# Patient Record
Sex: Male | Born: 1950
Health system: Southern US, Community
[De-identification: ages and names within clinical notes are randomized; demographics above are authoritative.]

## PROBLEM LIST (undated history)

## (undated) DIAGNOSIS — K219 Gastro-esophageal reflux disease without esophagitis: Secondary | ICD-10-CM

## (undated) DIAGNOSIS — S82899A Other fracture of unspecified lower leg, initial encounter for closed fracture: Secondary | ICD-10-CM

## (undated) DIAGNOSIS — R7303 Prediabetes: Secondary | ICD-10-CM

## (undated) DIAGNOSIS — G4730 Sleep apnea, unspecified: Secondary | ICD-10-CM

## (undated) DIAGNOSIS — N400 Enlarged prostate without lower urinary tract symptoms: Secondary | ICD-10-CM

## (undated) DIAGNOSIS — R519 Headache, unspecified: Secondary | ICD-10-CM

## (undated) DIAGNOSIS — R29898 Other symptoms and signs involving the musculoskeletal system: Secondary | ICD-10-CM

## (undated) DIAGNOSIS — H3320 Serous retinal detachment, unspecified eye: Secondary | ICD-10-CM

## (undated) DIAGNOSIS — E785 Hyperlipidemia, unspecified: Secondary | ICD-10-CM

## (undated) DIAGNOSIS — M1990 Unspecified osteoarthritis, unspecified site: Secondary | ICD-10-CM

## (undated) DIAGNOSIS — S42009A Fracture of unspecified part of unspecified clavicle, initial encounter for closed fracture: Secondary | ICD-10-CM

## (undated) DIAGNOSIS — I10 Essential (primary) hypertension: Secondary | ICD-10-CM

## (undated) DIAGNOSIS — J302 Other seasonal allergic rhinitis: Secondary | ICD-10-CM

## (undated) DIAGNOSIS — G4733 Obstructive sleep apnea (adult) (pediatric): Secondary | ICD-10-CM

## (undated) DIAGNOSIS — E039 Hypothyroidism, unspecified: Secondary | ICD-10-CM

## (undated) DIAGNOSIS — L719 Rosacea, unspecified: Secondary | ICD-10-CM

## (undated) HISTORY — PX: EYE SURGERY: SHX253

## (undated) HISTORY — DX: Obstructive sleep apnea (adult) (pediatric): G47.33

## (undated) HISTORY — PX: TONSILLECTOMY: SUR1361

## (undated) HISTORY — PX: CATARACT EXTRACTION: SUR2

## (undated) HISTORY — PX: OTHER SURGICAL HISTORY: SHX169

## (undated) HISTORY — DX: Hyperlipidemia, unspecified: E78.5

## (undated) HISTORY — DX: Essential (primary) hypertension: I10

## (undated) HISTORY — DX: Other seasonal allergic rhinitis: J30.2

## (undated) HISTORY — DX: Other fracture of unspecified lower leg, initial encounter for closed fracture: S82.899A

## (undated) HISTORY — PX: HERNIA REPAIR: SHX51

## (undated) HISTORY — DX: Gastro-esophageal reflux disease without esophagitis: K21.9

## (undated) HISTORY — DX: Benign prostatic hyperplasia without lower urinary tract symptoms: N40.0

## (undated) HISTORY — DX: Fracture of unspecified part of unspecified clavicle, initial encounter for closed fracture: S42.009A

## (undated) HISTORY — DX: Serous retinal detachment, unspecified eye: H33.20

## (undated) HISTORY — DX: Hypothyroidism, unspecified: E03.9

## (undated) HISTORY — DX: Rosacea, unspecified: L71.9

## (undated) HISTORY — DX: Sleep apnea, unspecified: G47.30

## (undated) HISTORY — DX: Other symptoms and signs involving the musculoskeletal system: R29.898

---

## 1998-07-16 ENCOUNTER — Ambulatory Visit (HOSPITAL_COMMUNITY): Admission: RE | Admit: 1998-07-16 | Discharge: 1998-07-16 | Payer: Self-pay

## 2003-03-14 ENCOUNTER — Ambulatory Visit (HOSPITAL_COMMUNITY): Admission: RE | Admit: 2003-03-14 | Discharge: 2003-03-14 | Payer: Self-pay | Admitting: Gastroenterology

## 2003-03-14 ENCOUNTER — Encounter (INDEPENDENT_AMBULATORY_CARE_PROVIDER_SITE_OTHER): Payer: Self-pay | Admitting: Specialist

## 2004-06-22 ENCOUNTER — Encounter: Admission: RE | Admit: 2004-06-22 | Discharge: 2004-06-22 | Payer: Self-pay | Admitting: Internal Medicine

## 2004-07-06 ENCOUNTER — Encounter: Admission: RE | Admit: 2004-07-06 | Discharge: 2004-07-06 | Payer: Self-pay | Admitting: Internal Medicine

## 2004-10-11 ENCOUNTER — Encounter: Admission: RE | Admit: 2004-10-11 | Discharge: 2004-10-11 | Payer: Self-pay | Admitting: Orthopedic Surgery

## 2005-01-20 ENCOUNTER — Observation Stay (HOSPITAL_COMMUNITY): Admission: EM | Admit: 2005-01-20 | Discharge: 2005-01-21 | Payer: Self-pay | Admitting: Emergency Medicine

## 2009-11-07 DIAGNOSIS — H3322 Serous retinal detachment, left eye: Secondary | ICD-10-CM

## 2009-11-07 HISTORY — PX: OTHER SURGICAL HISTORY: SHX169

## 2009-11-07 HISTORY — DX: Serous retinal detachment, left eye: H33.22

## 2010-08-04 ENCOUNTER — Encounter: Admission: RE | Admit: 2010-08-04 | Discharge: 2010-08-04 | Payer: Self-pay | Admitting: Internal Medicine

## 2010-08-10 ENCOUNTER — Ambulatory Visit (HOSPITAL_COMMUNITY): Admission: AD | Admit: 2010-08-10 | Discharge: 2010-08-11 | Payer: Self-pay | Admitting: Ophthalmology

## 2011-01-19 LAB — BASIC METABOLIC PANEL
BUN: 12 mg/dL (ref 6–23)
CO2: 27 mEq/L (ref 19–32)
Calcium: 9.5 mg/dL (ref 8.4–10.5)
Chloride: 104 mEq/L (ref 96–112)
Creatinine, Ser: 1.09 mg/dL (ref 0.4–1.5)
GFR calc Af Amer: >60 mL/min (ref >60)
GFR calc non Af Amer: >60 mL/min (ref >60)
Glucose, Bld: 112 mg/dL — ABNORMAL HIGH (ref 70–99)
Potassium: 4.6 mEq/L (ref 3.5–5.1)
Sodium: 138 mEq/L (ref 135–145)

## 2011-01-19 LAB — CBC
HCT: 41.2 % (ref 39.0–52.0)
Hemoglobin: 14.2 g/dL (ref 13.0–17.0)
MCH: 33.3 pg (ref 26.0–34.0)
MCHC: 34.5 g/dL (ref 30.0–36.0)
MCV: 96.5 fL (ref 78.0–100.0)
Platelets: 231 10*3/uL (ref 150–400)
RBC: 4.27 MIL/uL (ref 4.22–5.81)
RDW: 12.7 % (ref 11.5–15.5)
WBC: 4.5 10*3/uL (ref 4.0–10.5)

## 2011-01-19 LAB — SURGICAL PCR SCREEN
MRSA, PCR: NEGATIVE
Staphylococcus aureus: POSITIVE — AB

## 2011-03-25 NOTE — Op Note (Signed)
   Rick Turner, Rick Turner                       ACCOUNT NO.:  [id number]   MEDICAL RECORD NO.:  [id number]                   PATIENT TYPE:  AMB   LOCATION:  ENDO                                 FACILITY:  [facility]   PHYSICIAN:  Danise Edge, M.D.                DATE OF BIRTH:  25-Jun-1951   DATE OF PROCEDURE:  03/14/2003  DATE OF DISCHARGE:  03/14/2003                                 OPERATIVE REPORT   PROCEDURE PERFORMED:  Colonoscopy and polypectomy.   ENDOSCOPIST:  Charolett Bumpers, M.D.   INDICATIONS FOR PROCEDURE:  The patient is a 60 year old male born Apr 04, 1951.  The patient is scheduled to undergo his first screening colonoscopy  with polypectomy to prevent colon cancer.   PREMEDICATION:  Versed 10 mg, Demerol 50 mg.   DESCRIPTION OF PROCEDURE:  After obtaining informed consent, the patient was  placed in the left lateral decubitus position.  I administered intravenous  Demerol and intravenous Versed to achieve conscious sedation for the  procedure.  The patient's blood pressure, oxygen saturations and cardiac  rhythm were monitored throughout the procedure and documented in the medical  record.   Anal inspection was normal.  Digital rectal exam revealed a non-nodular  prostate.  The Olympus adult video colonoscope was introduced into the  rectum and advanced to the cecum.  Colonic preparation for the exam today  was excellent.   Rectum:  Normal.   Sigmoid colon and descending colon:  At 60 cm from the anal verge, a 1 mm  sessile polyp was removed with the cold biopsy forceps.   Splenic flexure:  Normal.   Transverse colon:  Normal.   Hepatic flexure:  Normal.   Ascending colon:  Normal.   Cecum and ileocecal valve:  Normal.    ASSESSMENT:  In the distal descending colon at 60 cm from the anal verge, a  1 mm sessile polyp was removed; otherwise normal proctocolonoscopy to the  cecum.                                                   Danise Edge, M.D.    MJ/MEDQ  D:  03/14/2003  T:  03/17/2003  Job:  [id number]   cc:   Thora Lance, M.D.  [address]  [place]  [place] [address]  Fax: [phone]

## 2011-03-25 NOTE — H&P (Signed)
Rick Turner, Rick Turner           ACCOUNT NO.:  [id number]   MEDICAL RECORD NO.:  [id number]          PATIENT TYPE:  INP   LOCATION:  1844                         FACILITY:  [facility]   PHYSICIAN:  Thora Lance, M.D.  DATE OF BIRTH:  [birth year]   DATE OF ADMISSION:  01/20/2005  DATE OF DISCHARGE:                                HISTORY & PHYSICAL   CHIEF COMPLAINT:  Chest tightness.   HISTORY OF PRESENT ILLNESS:  This is 60 year old white male with a history  of hypertension, hyperlipidemia, both medially treated, who woke up this  a.m. with tightness across his upper chest, sometimes radiating up towards  the neck, but not the arms.  The tightness seemed worse with activity, such  as getting ready for work, taking a shower.  If he took a deep breath or  raised his arm, it felt worse.  There was some mild shortness of breath and  difficulty getting a full breath associated.  Over the last couple weeks, he  has noticed times when he has felt mildly short of breath while at rest.  In  the ER, he was given nitroglycerin paste, which seemed to help resolve the  chest tightness.   PAST MEDICAL HISTORY:  1.  Hypertension.  2.  Hyperlipidemia.  3.  Seasonal allergic rhinitis.  4.  BPH.  5.  Hypothyroidism.   SURGICAL HISTORY:  1.  Tonsillectomy.  2.  LASIK eye surgery.   ALLERGIES:  No known drug allergies.   CURRENT MEDICATIONS:  1.  Synthroid 88 mcg daily.  2.  Allegra 180 mg daily.  3.  Lisinopril 20 mg daily.  4.  Zocor 20 mg q.p.m.  5.  Nasonex two sprays each nostril daily.  6.  Aspirin 81 mg daily.  7.  Multivitamin once a day.   FAMILY HISTORY:  Father alive at age 54.  History of CAD at age 20, CVD,  diabetes, stroke, pancreatic insufficiency, BPH, PUD.  Mother at 29 had CAD  in her 68s, arthritis.  Brother with peptic ulcer disease.  Two brothers  with hypertension.  Paternal uncle with an MI in his 4s.   SOCIAL HISTORY:  Married.  Two children.  Smoking - no.   Alcohol - moderate.   PHYSICAL EXAMINATION:  GENERAL:  Well-appearing white male.  Blood pressure  142/88, heart rate 73, respirations 18, oxygen saturation 100% on room air.  HEENT:  Pupils equal, round and respond to light.  Extraocular movements  intact.  Ears - TMs are clear.  Oropharynx clear.  NECK:  Supple.  No lymphadenopathy.  No carotid bruits.  LUNGS:  Clear.  HEART:  Regular rate and rhythm.  No murmur, rub, or gallop.  ABDOMEN:  Soft, nontender.  No masses or hepatosplenomegaly.  EXTREMITIES:  No edema.  NEUROLOGIC:  Non-focal.   LABORATORY DATA:  Sodium 136, potassium 4.2, chloride 105, bicarbonate 24,  creatinine 1.3, glucose 109, MB fraction 1.1, troponin I of __________, D-  dimer within normal limits.  Chest x-ray showed mild bronchitic changes.  No  acute disease.  EKG revealed normal sinus rhythm, normal EKG.   ASSESSMENT:  Atypical chest pain, rule out cardiac source.  He has multiple  cardiac risk factors including hyperlipidemia, hypertension, and family  history.   PLAN:  1.  Observation.  2.  Rule out myocardial infarction by enzymes.  3.  Lovenox.  4.  Aspirin.  5.  Nitroglycerin p.r.n.  6.  Cardiology consult.      JJG/MEDQ  D:  01/20/2005  T:  01/20/2005  Job:  [id number]

## 2011-03-25 NOTE — Discharge Summary (Signed)
[name], Rick Turner           ACCOUNT NO.:  [id number]   MEDICAL RECORD NO.:  [id number]          PATIENT TYPE:  INP   LOCATION:  3707                         FACILITY:  [facility]   PHYSICIAN:  Thora Lance, M.D.  DATE OF BIRTH:  1951/04/15   DATE OF ADMISSION:  01/20/2005  DATE OF DISCHARGE:  01/21/2005                                 DISCHARGE SUMMARY   REASON FOR ADMISSION:  This is a 60 year old white male with history of  hypertension and hyperlipidemia, who presented this morning to the ER  complaining of chest tightness across chest and into his neck; worse with  activity.  Also worse with __________.  Mild dyspnea associated with it.   SIGNIFICANT FINDINGS:  Blood pressure 142/88, heart rate 73, respirations  18, afebrile; 100% on room air.  The rest of the examination was  unremarkable.   LABORATORY STUDIES:  Sodium 136, potassium 4.2, chloride 105, BUN 24,  creatinine 1.3, glucose 109.  MB fraction 1.1, Troponin-I less than 0.05.  d-  Dimer normal.   CHEST X-RAY:  Negative, no acute disease.   EKG:  Normal sinus rhythm; normal EKG.   HOSPITAL COURSE:  ATYPICAL CHEST PAIN.  The patient was admitted for  atypical chest pain.  He was placed on aspirin and Lovenox.  Cardiology (Dr.  Corliss Marcus) saw the patient and recommended outpatient treadmill test, if  the patient ruled out for MI and a cardiac catheterization if he ruled in.  The patient's serial cardiac enzymes remained within normal limits.  The  patient had no further pain during his hospitalization.  He was discharged  the next day in good condition.   He will see me in one week.  Outpatient stress test will be arranged.   DISCHARGE DIAGNOSES:  Chest pain/rule out myocardial infarction.   PROCEDURES:  None.   DISCHARGE MEDICATIONS:  1.  Synthroid 88 mcg q.d.  2.  Lisinopril 20 mg q.d.  3.  Zocor 20 mg q.d.  4.  Nasonex.  5.  Allegra 180 mg q.d.  6.  Aspirin 325 mg q.d.  7.  Nitroglycerin p.r.n.   DIET:  Low sodium.   DISPOSITION:  To home.   DISCHARGE ACTIVITIES:  As tolerated.   FOLLOWUP:  One week with Dr. Valentina Lucks.      JJG/MEDQ  D:  01/21/2005  T:  01/21/2005  Job:  [id number]   cc:   Francisca December, M.D.  [address]  [place]  [place] [address]  Fax: [phone]

## 2011-03-25 NOTE — Consult Note (Signed)
Rick Turner, Rick Turner           ACCOUNT NO.:  [id number]   MEDICAL RECORD NO.:  [id number]          PATIENT TYPE:  INP   LOCATION:  1844                         FACILITY:  [facility]   PHYSICIAN:  Francisca December, M.D.  DATE OF BIRTH:  [birth year]   DATE OF CONSULTATION:  01/20/2005  DATE OF DISCHARGE:                                   CONSULTATION   REASON FOR CONSULTATION:  Chest pain.   HISTORY OF PRESENT ILLNESS:  Mr. Rory Xiang is a pleasant 60 year old  man who awoke this morning and noticed as he was giving out of bed an  anterior substernal tightness to pressure-like discomfort that stayed in the  center of his chest and did not seem to resolved.  In fact, later when he  was in the shower, as he washed his hair and moved his arms or tried to take  a deep breath it seemed to worsen.  At one point, it radiated slightly up  into the neck.  It was not associated with nausea, diaphoresis.  As  mentioned, there was some mild dyspnea.  The discomfort persisted for  greater than one hour, and he decided to present to [facility] Emergency  Room.  Here in the emergency room, where he arrived at about 0740 he was  given oxygen by nasal cannula and a topical nitroglycerin paste with  subsequent resolution of the discomfort.  At the time of my evaluation at 3  o'clock in the afternoon he denies any discomfort whatsoever.  However, they  did have to remove the nitroglycerin as it was producing significant  cephalgia.   He has no prior cardiac history.  He exercises on an elliptical machine  three times a week for 30 minutes and has not had any symptoms associated  with that activity.  He has not had increasing fatigue or loss of energy.   PAST MEDICAL HISTORY:  1.  Hypertension.  2.  Hyperlipidemia.  3.  Benign prostatic hypertrophy.  4.  Hypothyroidism.  5.  Seasonal allergies.   PAST SURGICAL HISTORY:  1.  Remote history of tonsillectomy and adenoidectomy.  2.  He also  has had laser eye surgery.   CURRENT MEDICATIONS:  1.  Synthroid 0.08 mg p.o. every day.  2.  Lisinopril 20 mg p.o. every day.  3.  Zocor 20 mg p.o. every day.  4.  Nasonex allergy spray each day.  5.  Allegra 180 mg p.o. every day.  6.  Aspirin 81 mg p.o. every day.   DRUG ALLERGIES:  None known.   FAMILY HISTORY:  Not significant for early coronary disease.  His father did  have a stroke at the age of 60.  His mother developed angina late in age.   REVIEW OF SYSTEMS:  Negative except as mentioned above.   PHYSICAL EXAMINATION:  VITAL SIGNS:  Blood pressure 142/88, pulse 73 and  regular.  Temperature 98.0, respirations 18, O2 saturation on room air 100%.  GENERAL:  This is a well-appearing 60 year old gentleman in no acute  distress.  HEENT:  Unremarkable.  Head is atraumatic and normocephalic.  The pupils are  equal, round and reactive to light and accommodation.  Extraocular movements  are intact.  Oral mucosa is pink and moist.  Teeth and gums in good repair.  NECK:  Supple without thyromegaly or masses.  The carotid upstrokes are  normal.  There is no bruit.  There is no jugular venous distention.  CHEST:  His chest is clear with good excursion.  HEART:  Regular rhythm.  Normal S1 and S2 are heard.  No S3, S4, click,  murmur or rub noted.  ABDOMEN:  Flat, soft and nontender without hepatosplenomegaly or midline  pulsatile mass.  Bowel sounds are present in all quadrants.  GENITALIA:  External genitalia - normal male phallus, descended testicles,  no lesions.  RECTAL:  Not performed.  EXTREMITIES:  Full range of motion.  No edema.  Intact distal pulses.  NEUROLOGICAL:  Cranial nerves II-XII are intact.  Motor and sensory are  grossly intact.  Gait not tested.  SKIN:  Warm, dry and clear.   LABORATORY DATA:  Initial point of care enzymes in the emergency room are  negative.  Serum electrolytes, BUN, creatinine, glucose all within normal  limits.  Electrocardiogram shows  sinus rhythm, normal EKG.  Chest x-ray:  No  active cardiopulmonary disease.   IMPRESSION:  1.  Prolonged episode of atypical angina, currently without evidence of      coronary ischemia by ECG or cardiac markers.  2.  Risk factors for coronary disease include age, sex, hyperlipidemia and      hypertension.  3.  Other problems as listed above.   PLAN:  1.  I agree with the management thus far which includes admission for      telemetry monitoring and repeat CK-MB and troponin enzymes.  Agree with      the anticoagulation plan with Lovenox and aspirin.  Would give      nitroglycerin on a p.r.n. basis only secondary to his induced headache.  2.  If the patient rules out for myocardial infarction would obtain exercise      stress test, not necessarily with myocardial perfusion imaging.  This      can be done either here in the hospital or as an outpatient.  If      symptoms recur or cardiac enzymes become positive then cardiac      catheterization would be indicated.   Thank you very much for allowing me to assist in the care of Mr. Ahmon Tosi.  It has been a pleasure to do so.  I will discuss his further  care with you.      JHE/MEDQ  D:  01/20/2005  T:  01/20/2005  Job:  [id number]   cc:   Thora Lance, M.D.  [address]  [place]  [place] [address]  Fax: [phone]

## 2011-06-29 ENCOUNTER — Encounter (INDEPENDENT_AMBULATORY_CARE_PROVIDER_SITE_OTHER): Payer: Managed Care, Other (non HMO) | Admitting: Ophthalmology

## 2011-06-29 DIAGNOSIS — H2510 Age-related nuclear cataract, unspecified eye: Secondary | ICD-10-CM

## 2011-06-29 DIAGNOSIS — H33009 Unspecified retinal detachment with retinal break, unspecified eye: Secondary | ICD-10-CM

## 2011-06-29 DIAGNOSIS — H43819 Vitreous degeneration, unspecified eye: Secondary | ICD-10-CM

## 2011-08-08 HISTORY — PX: CATARACT EXTRACTION: SUR2

## 2012-06-28 ENCOUNTER — Encounter (INDEPENDENT_AMBULATORY_CARE_PROVIDER_SITE_OTHER): Payer: Managed Care, Other (non HMO) | Admitting: Ophthalmology

## 2012-06-28 DIAGNOSIS — H33009 Unspecified retinal detachment with retinal break, unspecified eye: Secondary | ICD-10-CM

## 2012-06-28 DIAGNOSIS — H43819 Vitreous degeneration, unspecified eye: Secondary | ICD-10-CM

## 2012-06-28 DIAGNOSIS — H2510 Age-related nuclear cataract, unspecified eye: Secondary | ICD-10-CM

## 2013-06-28 ENCOUNTER — Ambulatory Visit (INDEPENDENT_AMBULATORY_CARE_PROVIDER_SITE_OTHER): Payer: Managed Care, Other (non HMO) | Admitting: Ophthalmology

## 2013-07-17 ENCOUNTER — Ambulatory Visit (INDEPENDENT_AMBULATORY_CARE_PROVIDER_SITE_OTHER): Payer: Commercial Indemnity | Admitting: Ophthalmology

## 2013-07-17 DIAGNOSIS — H33009 Unspecified retinal detachment with retinal break, unspecified eye: Secondary | ICD-10-CM

## 2013-07-17 DIAGNOSIS — H35039 Hypertensive retinopathy, unspecified eye: Secondary | ICD-10-CM

## 2013-07-17 DIAGNOSIS — I10 Essential (primary) hypertension: Secondary | ICD-10-CM

## 2013-07-17 DIAGNOSIS — H2510 Age-related nuclear cataract, unspecified eye: Secondary | ICD-10-CM

## 2013-07-17 DIAGNOSIS — H43819 Vitreous degeneration, unspecified eye: Secondary | ICD-10-CM

## 2013-08-20 ENCOUNTER — Encounter (INDEPENDENT_AMBULATORY_CARE_PROVIDER_SITE_OTHER): Payer: 59 | Admitting: Ophthalmology

## 2013-08-20 DIAGNOSIS — H2510 Age-related nuclear cataract, unspecified eye: Secondary | ICD-10-CM

## 2013-08-20 DIAGNOSIS — H43819 Vitreous degeneration, unspecified eye: Secondary | ICD-10-CM

## 2013-08-20 DIAGNOSIS — H33009 Unspecified retinal detachment with retinal break, unspecified eye: Secondary | ICD-10-CM

## 2013-08-20 DIAGNOSIS — I10 Essential (primary) hypertension: Secondary | ICD-10-CM

## 2013-08-20 DIAGNOSIS — Z961 Presence of intraocular lens: Secondary | ICD-10-CM | POA: Insufficient documentation

## 2013-08-20 DIAGNOSIS — H35039 Hypertensive retinopathy, unspecified eye: Secondary | ICD-10-CM

## 2013-08-20 DIAGNOSIS — H3320 Serous retinal detachment, unspecified eye: Secondary | ICD-10-CM | POA: Insufficient documentation

## 2013-08-20 DIAGNOSIS — H04129 Dry eye syndrome of unspecified lacrimal gland: Secondary | ICD-10-CM | POA: Insufficient documentation

## 2013-08-30 ENCOUNTER — Encounter (INDEPENDENT_AMBULATORY_CARE_PROVIDER_SITE_OTHER): Payer: 59 | Admitting: Ophthalmology

## 2013-08-30 DIAGNOSIS — H33009 Unspecified retinal detachment with retinal break, unspecified eye: Secondary | ICD-10-CM

## 2013-12-02 ENCOUNTER — Encounter (INDEPENDENT_AMBULATORY_CARE_PROVIDER_SITE_OTHER): Payer: 59 | Admitting: Ophthalmology

## 2013-12-02 DIAGNOSIS — H2510 Age-related nuclear cataract, unspecified eye: Secondary | ICD-10-CM

## 2013-12-02 DIAGNOSIS — H33009 Unspecified retinal detachment with retinal break, unspecified eye: Secondary | ICD-10-CM

## 2013-12-02 DIAGNOSIS — H43819 Vitreous degeneration, unspecified eye: Secondary | ICD-10-CM

## 2013-12-02 DIAGNOSIS — I10 Essential (primary) hypertension: Secondary | ICD-10-CM

## 2013-12-02 DIAGNOSIS — H35039 Hypertensive retinopathy, unspecified eye: Secondary | ICD-10-CM

## 2014-07-18 ENCOUNTER — Ambulatory Visit (INDEPENDENT_AMBULATORY_CARE_PROVIDER_SITE_OTHER): Payer: Commercial Indemnity | Admitting: Ophthalmology

## 2014-08-04 ENCOUNTER — Encounter: Payer: Self-pay | Admitting: Cardiology

## 2014-08-11 ENCOUNTER — Ambulatory Visit
Admission: RE | Admit: 2014-08-11 | Discharge: 2014-08-11 | Disposition: A | Discharge status: Home / Self Care | Payer: Managed Care, Other (non HMO) | Source: Ambulatory Visit | Attending: Internal Medicine | Admitting: Internal Medicine

## 2014-08-11 ENCOUNTER — Other Ambulatory Visit: Payer: Self-pay | Admitting: Internal Medicine

## 2014-08-11 DIAGNOSIS — M25531 Pain in right wrist: Secondary | ICD-10-CM

## 2014-08-25 ENCOUNTER — Encounter (HOSPITAL_COMMUNITY): Payer: Managed Care, Other (non HMO)

## 2014-09-01 ENCOUNTER — Ambulatory Visit (HOSPITAL_COMMUNITY): Payer: Managed Care, Other (non HMO) | Attending: Internal Medicine | Admitting: Radiology

## 2014-09-01 VITALS — BP 159/90 | Ht 70.0 in | Wt 233.0 lb

## 2014-09-01 DIAGNOSIS — R0602 Shortness of breath: Secondary | ICD-10-CM | POA: Diagnosis not present

## 2014-09-01 DIAGNOSIS — I10 Essential (primary) hypertension: Secondary | ICD-10-CM | POA: Diagnosis not present

## 2014-09-01 DIAGNOSIS — R0609 Other forms of dyspnea: Secondary | ICD-10-CM

## 2014-09-01 MED ORDER — TECHNETIUM TC 99M SESTAMIBI GENERIC - CARDIOLITE
10.0000 | Freq: Once | INTRAVENOUS | Status: AC | PRN
  Administered 2014-09-01: 10 via INTRAVENOUS

## 2014-09-01 MED ORDER — TECHNETIUM TC 99M SESTAMIBI GENERIC - CARDIOLITE
30.0000 | Freq: Once | INTRAVENOUS | Status: AC | PRN
  Administered 2014-09-01: 30 via INTRAVENOUS

## 2014-09-01 NOTE — Progress Notes (Signed)
[facility] [address] [place], [place] [address] [phone]    Cardiology Nuclear Med Study  Darvis Croft is a 63 y.o. male     MRN : [id number]     DOB: 13-Sep-1951  Procedure Date: 09/01/2014  Nuclear Med Background Indication for Stress Test:  Evaluation for Ischemia History:  No Cardiac History Cardiac Risk Factors: Hypertension  Symptoms:  DOE and SOB   Nuclear Pre-Procedure Caffeine/Decaff Intake:  None> 12 hrs NPO After: 9:30pm   Lungs:  clear O2 Sat: 96% on room air. IV 0.9% NS with Angio Cath:  20g  IV Site: R Antecubital x 1, tolerated well IV Started by:  Irven Baltimore, RN  Chest Size (in):  46 Cup Size: n/a  Height: 5\' 10"  (1.778 m)  Weight:  233 lb (105.688 kg)  BMI:  Body mass index is 33.43 kg/(m^2). Tech Comments:  Amlodipine on arrival. Irven Baltimore, Therapist, sports.    Nuclear Med Study 1 or 2 day study: 1 day  Stress Test Type:  Stress  Reading MD: N/A  Order Authorizing Provider:  [name], MD  Resting Radionuclide: Technetium 90m Sestamibi  Resting Radionuclide Dose: 11.0 mCi   Stress Radionuclide:  Technetium 22m Sestamibi  Stress Radionuclide Dose: 33.0 mCi           Stress Protocol Rest HR: 71 Stress HR: 148  Rest BP: 159/90 Stress BP: 177/54  Exercise Time (min): 10:13 METS: 10.9   Predicted Max HR: 157 bpm % Max HR: 94.27 bpm Rate Pressure Product: 26196   Dose of Adenosine (mg):  n/a Dose of Lexiscan: n/a mg  Dose of Atropine (mg): n/a Dose of Dobutamine: n/a mcg/kg/min (at max HR)  Stress Test Technologist: Crissie Figures, RN  Nuclear Technologist:  Earl Many, CNMT     Rest Procedure:  Myocardial perfusion imaging was performed at rest 45 minutes following the intravenous administration of Technetium 69m Sestamibi. Rest ECG: NSR - Normal EKG  Stress Procedure:  The patient exercised on the treadmill utilizing the Bruce Protocol for 10:13 minutes. The patient stopped due to Dyspnea and denied any  chest pain.  Technetium 97m Sestamibi was injected at peak exercise and myocardial perfusion imaging was performed after a brief delay. Stress ECG: No significant ST segment change suggestive of ischemia.  QPS Raw Data Images:  Acquisition technically good; normal left ventricular size. Stress Images:  Normal homogeneous uptake in all areas of the myocardium. Rest Images:  Normal homogeneous uptake in all areas of the myocardium. Subtraction (SDS):  No evidence of ischemia. Transient Ischemic Dilatation (Normal <1.22):  0.93 Lung/Heart Ratio (Normal <0.45):  0.31  Quantitative Gated Spect Images QGS EDV:  93 ml QGS ESV:  34 ml  Impression Exercise Capacity:  Good exercise capacity. BP Response:  Normal blood pressure response. Clinical Symptoms:  No chest pain or dyspnea. ECG Impression:  No significant ST segment change suggestive of ischemia. Comparison with Prior Nuclear Study: No previous nuclear study performed  Overall Impression:  Normal stress nuclear study.  LV Ejection Fraction: 63%.  LV Wall Motion:  NL LV Function; NL Wall Motion  Kirk Ruths

## 2014-09-15 ENCOUNTER — Encounter (INDEPENDENT_AMBULATORY_CARE_PROVIDER_SITE_OTHER): Payer: Self-pay | Admitting: Ophthalmology

## 2014-09-30 ENCOUNTER — Ambulatory Visit: Payer: Managed Care, Other (non HMO) | Admitting: Interventional Cardiology

## 2014-12-02 ENCOUNTER — Ambulatory Visit (INDEPENDENT_AMBULATORY_CARE_PROVIDER_SITE_OTHER): Payer: 59 | Admitting: Ophthalmology

## 2014-12-02 DIAGNOSIS — H43811 Vitreous degeneration, right eye: Secondary | ICD-10-CM

## 2014-12-02 DIAGNOSIS — H35372 Puckering of macula, left eye: Secondary | ICD-10-CM

## 2014-12-02 DIAGNOSIS — I10 Essential (primary) hypertension: Secondary | ICD-10-CM

## 2014-12-02 DIAGNOSIS — H35033 Hypertensive retinopathy, bilateral: Secondary | ICD-10-CM

## 2014-12-02 DIAGNOSIS — H338 Other retinal detachments: Secondary | ICD-10-CM

## 2015-12-04 ENCOUNTER — Ambulatory Visit (INDEPENDENT_AMBULATORY_CARE_PROVIDER_SITE_OTHER): Payer: 59 | Admitting: Ophthalmology

## 2015-12-04 DIAGNOSIS — H35033 Hypertensive retinopathy, bilateral: Secondary | ICD-10-CM

## 2015-12-04 DIAGNOSIS — I10 Essential (primary) hypertension: Secondary | ICD-10-CM

## 2015-12-04 DIAGNOSIS — H35372 Puckering of macula, left eye: Secondary | ICD-10-CM | POA: Diagnosis not present

## 2015-12-04 DIAGNOSIS — H43811 Vitreous degeneration, right eye: Secondary | ICD-10-CM | POA: Diagnosis not present

## 2015-12-04 DIAGNOSIS — H338 Other retinal detachments: Secondary | ICD-10-CM

## 2016-03-08 DIAGNOSIS — A630 Anogenital (venereal) warts: Secondary | ICD-10-CM | POA: Diagnosis not present

## 2016-03-08 DIAGNOSIS — Z0000 Encounter for general adult medical examination without abnormal findings: Secondary | ICD-10-CM | POA: Diagnosis not present

## 2016-03-08 DIAGNOSIS — R310 Gross hematuria: Secondary | ICD-10-CM | POA: Diagnosis not present

## 2016-04-19 ENCOUNTER — Encounter: Payer: Self-pay | Admitting: Podiatry

## 2016-04-19 ENCOUNTER — Ambulatory Visit (INDEPENDENT_AMBULATORY_CARE_PROVIDER_SITE_OTHER): Payer: Medicare Other | Admitting: Podiatry

## 2016-04-19 VITALS — BP 143/88 | HR 68 | Resp 16

## 2016-04-19 DIAGNOSIS — M2040 Other hammer toe(s) (acquired), unspecified foot: Secondary | ICD-10-CM | POA: Diagnosis not present

## 2016-04-19 DIAGNOSIS — M205X1 Other deformities of toe(s) (acquired), right foot: Secondary | ICD-10-CM

## 2016-04-19 DIAGNOSIS — L600 Ingrowing nail: Secondary | ICD-10-CM | POA: Diagnosis not present

## 2016-04-19 DIAGNOSIS — H33009 Unspecified retinal detachment with retinal break, unspecified eye: Secondary | ICD-10-CM | POA: Insufficient documentation

## 2016-04-19 MED ORDER — NEOMYCIN-POLYMYXIN-HC 3.5-10000-1 OT SOLN: OTIC | Status: DC

## 2016-04-19 NOTE — Progress Notes (Signed)
   Subjective:    Patient ID: Rick Turner, male    DOB: [birth year], 65 y.o.   MRN: [id number]  HPI: Rick Turner presents today with a primary chief concern of a painful nail medial border hallux left. Rick Turner states that many months ago Rick Turner had trauma to the nail which resulted in the Foley should of the nail. As it has grown back it seems to be growing into the tibial border. Rick Turner has been to see his primary care provider who prescribed him antibiotics. Rick Turner states that the border has gotten some better but is still painful.    Review of Systems  All other systems reviewed and are negative.      Objective:   Physical Exam: I reviewed his past history medications allergy surgery social history and review of systems. Pulses are strongly palpable. Neurologic sensorium is intact. Deep tendon reflexes are intact bilateral and muscle strength +5 over 5 dorsiflexion plantar flexors and inverters everters all into the musculature is intact. Orthopedic evaluation and strains all joints distal to the ankle range of motion without crepitation. Cutaneous evaluation demonstrates a new nail growing out approximately 85% complete with a sharp incurvated nail margin with erythema along the tibial border. There is moderate mild purulence no malodor.        Assessment & Plan:  Assessment: Ingrown nail paronychia abscess hallux left tibial border.  Plan: Discussed etiology pathology conservative versus surgical therapies. At this point I recommended tibial border matrixectomy to this new nail plate. Rick Turner agreed. This performed under local anesthetic and Rick Turner tolerated the procedure well. Rick Turner was provided with both oral and home-going instructions for soaking of his toe as well as a prescription for Cortisporin Otic to be applied twice daily after soaking. We will follow-up with him in 1 week.

## 2016-04-19 NOTE — Patient Instructions (Addendum)

## 2016-05-03 ENCOUNTER — Encounter: Payer: Self-pay | Admitting: Podiatry

## 2016-05-03 ENCOUNTER — Ambulatory Visit (INDEPENDENT_AMBULATORY_CARE_PROVIDER_SITE_OTHER): Payer: Medicare Other | Admitting: Podiatry

## 2016-05-03 DIAGNOSIS — L600 Ingrowing nail: Secondary | ICD-10-CM

## 2016-05-03 NOTE — Progress Notes (Signed)
He presents today for follow-up of his left hallux. He states that he is doing very well there is some tenderness left. He is status post matrixectomy. He continues to soak in Betadine and water.  Objective: Vital signs are stable alert and oriented 3 pulses are palpable. Margins appear to be healing very well no erythema cellulitis drainage or odor.  Assessment: Well-healing surgical toe hallux left.  Plan: Discontinue Betadine sterile thousand salt warm water soaks continue the use of Cortisporin otic drops covered during the daytime and leave open at bedtime. Continue submental completely resolved. Follow up with [place] with questions or concerns.

## 2016-09-06 DIAGNOSIS — R7301 Impaired fasting glucose: Secondary | ICD-10-CM | POA: Diagnosis not present

## 2016-09-06 DIAGNOSIS — E039 Hypothyroidism, unspecified: Secondary | ICD-10-CM | POA: Diagnosis not present

## 2016-09-06 DIAGNOSIS — I10 Essential (primary) hypertension: Secondary | ICD-10-CM | POA: Diagnosis not present

## 2016-09-06 DIAGNOSIS — Z6832 Body mass index (BMI) 32.0-32.9, adult: Secondary | ICD-10-CM | POA: Diagnosis not present

## 2016-09-06 DIAGNOSIS — E669 Obesity, unspecified: Secondary | ICD-10-CM | POA: Diagnosis not present

## 2016-09-06 DIAGNOSIS — Z125 Encounter for screening for malignant neoplasm of prostate: Secondary | ICD-10-CM | POA: Diagnosis not present

## 2016-09-06 DIAGNOSIS — Z0000 Encounter for general adult medical examination without abnormal findings: Secondary | ICD-10-CM | POA: Diagnosis not present

## 2016-09-06 DIAGNOSIS — Z1389 Encounter for screening for other disorder: Secondary | ICD-10-CM | POA: Diagnosis not present

## 2016-09-06 DIAGNOSIS — K219 Gastro-esophageal reflux disease without esophagitis: Secondary | ICD-10-CM | POA: Diagnosis not present

## 2016-09-06 DIAGNOSIS — E7800 Pure hypercholesterolemia, unspecified: Secondary | ICD-10-CM | POA: Diagnosis not present

## 2016-09-06 DIAGNOSIS — N400 Enlarged prostate without lower urinary tract symptoms: Secondary | ICD-10-CM | POA: Diagnosis not present

## 2016-09-06 DIAGNOSIS — Z23 Encounter for immunization: Secondary | ICD-10-CM | POA: Diagnosis not present

## 2016-09-07 DIAGNOSIS — H59812 Chorioretinal scars after surgery for detachment, left eye: Secondary | ICD-10-CM | POA: Diagnosis not present

## 2016-09-07 DIAGNOSIS — H1789 Other corneal scars and opacities: Secondary | ICD-10-CM | POA: Diagnosis not present

## 2016-09-07 DIAGNOSIS — H5213 Myopia, bilateral: Secondary | ICD-10-CM | POA: Diagnosis not present

## 2016-09-07 DIAGNOSIS — H04123 Dry eye syndrome of bilateral lacrimal glands: Secondary | ICD-10-CM | POA: Diagnosis not present

## 2016-09-07 DIAGNOSIS — H52223 Regular astigmatism, bilateral: Secondary | ICD-10-CM | POA: Diagnosis not present

## 2016-09-07 DIAGNOSIS — Z9849 Cataract extraction status, unspecified eye: Secondary | ICD-10-CM | POA: Diagnosis not present

## 2016-09-10 DIAGNOSIS — S0501XA Injury of conjunctiva and corneal abrasion without foreign body, right eye, initial encounter: Secondary | ICD-10-CM | POA: Diagnosis not present

## 2016-09-10 DIAGNOSIS — H43392 Other vitreous opacities, left eye: Secondary | ICD-10-CM | POA: Diagnosis not present

## 2016-09-10 DIAGNOSIS — Z8669 Personal history of other diseases of the nervous system and sense organs: Secondary | ICD-10-CM | POA: Diagnosis not present

## 2016-09-10 DIAGNOSIS — H35412 Lattice degeneration of retina, left eye: Secondary | ICD-10-CM | POA: Diagnosis not present

## 2016-10-20 ENCOUNTER — Encounter (INDEPENDENT_AMBULATORY_CARE_PROVIDER_SITE_OTHER): Payer: Self-pay

## 2016-10-20 ENCOUNTER — Ambulatory Visit (INDEPENDENT_AMBULATORY_CARE_PROVIDER_SITE_OTHER): Payer: Medicare Other

## 2016-10-20 ENCOUNTER — Ambulatory Visit (INDEPENDENT_AMBULATORY_CARE_PROVIDER_SITE_OTHER): Payer: Medicare Other | Admitting: Orthopedic Surgery

## 2016-10-20 ENCOUNTER — Encounter (INDEPENDENT_AMBULATORY_CARE_PROVIDER_SITE_OTHER): Payer: Self-pay | Admitting: Orthopedic Surgery

## 2016-10-20 DIAGNOSIS — M25561 Pain in right knee: Secondary | ICD-10-CM

## 2016-10-20 NOTE — Progress Notes (Signed)
Office Visit Note   Patient: Rick Turner           Date of Birth: [birth year]           MRN: [id number] Visit Date: 10/20/2016 Requested by: [name], MD [address] [place], [place] [address] PCP: Irven Shelling, MD  Subjective: Chief Complaint  Patient presents with  . Right Leg - Pain  . Right Knee - Pain    HPI Rick Turner is a 65 year old patient with some right thigh and anterior knee pain for couple weeks.  They said he just retired.  He has been doing a lot of working out.  Does spin class and cross training.  Had some muscle soreness in the in the thigh but that improved.  He is rested for 2 weeks.  Quad pain has subsided.  He does take cholesterol medication for many years.  Denies any back pain or numbness and tingling.  He does do exercising about 5-6 days a week.  He reports a pulling sensation in the anterior knee and distal quad region.  Stairs now are okay.  He does have some left knee issues in the patella tendon brace helps that.              Review of Systems All systems reviewed are negative as they relate to the chief complaint within the history of present illness.  Patient denies  fevers or chills.    Assessment & Plan: Visit Diagnoses:  1. Acute pain of right knee     Plan: Impression is right left leg quad and muscle pain with pretty unclear etiology at this time.  His delayed onset muscle soreness from overworking the quads has improved but he still describes weakness in terms of being  able to get up from a seated position.  On manual motor testing his quad hamstring and hip flexor strength is good and symmetric.  There is no atrophy in the leg muscles.  I recommended that he discuss with his primary care provider coming off the cholesterol meds for about 6 weeks as a trial to see if that would help some of this muscle soreness and weakness.  If not then neurological referral may be indicated.  I'll see him back as needed  Follow-Up  Instructions: No Follow-up on file.   Orders:  Orders Placed This Encounter  Procedures  . XR Knee 1-2 Views Right   No orders of the defined types were placed in this encounter.     Procedures: No procedures performed   Clinical Data: No additional findings.  Objective: Vital Signs: There were no vitals taken for this visit.  Physical Exam   Constitutional: Patient appears well-developed HEENT:  Head: Normocephalic Eyes:EOM are normal Neck: Normal range of motion Cardiovascular: Normal rate Pulmonary/chest: Effort normal Neurologic: Patient is alert Skin: Skin is warm Psychiatric: Patient has normal mood and affect    Ortho Exam on examination patient has normal gait and alignment palpable pedal pulses good range of motion of both knees with no effusion he has more patellofemoral crepitus on the right than the left.  Collateral crucial ligaments are stable with no medial or lateral joint line tenderness noted.  There is no groin pain with internal/external rotation of the legs he has good muscle quad tone and definition in both legs bilaterally.  Palpation of this region demonstrates no asymmetry.  Specialty Comments:  No specialty comments available.  Imaging: Xr Knee 1-2 Views Right  Result Date: 10/20/2016  AP lateral right knee reviewed.  Joint space maintained with normal alignment on the AP view.  Mild degenerative changes present.  On the lateral view patellofemoral arthritis is noted.  No soft tissue calcifications present.  Bones otherwise normal    PMFS History: Patient Active Problem List   Diagnosis Date Noted  . 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 Dry eye syndrome 08/20/2013  . Nuclear sclerotic cataract 08/20/2013  . Pseudoaphakia 08/20/2013  . Detached retina 08/20/2013   Past Medical History:  Diagnosis Date  . Acne rosacea   . BPH (benign prostatic hypertrophy)   . Clavicle fracture    left  . Detached retina     left   . Fx ankle    left  . Hyperlipidemia   . Hypertension   . Hypothyroidism   . OSA (obstructive sleep apnea)    mild  . Seasonal allergies     Family History  Problem Relation Age of Onset  . CAD Mother   . CAD Father   . Hypertension Brother   . Hypertension Brother     Past Surgical History:  Procedure Laterality Date  . CATARACT EXTRACTION     left  . detached retiina     left  . EYE SURGERY     lasix  . TONSILLECTOMY     Social History   Occupational History  . Not on file.   Social History Main Topics  . Smoking status: Never Smoker  . Smokeless tobacco: Not on file  . Alcohol use Not on file  . Drug use: Unknown  . Sexual activity: Not on file

## 2016-10-27 DIAGNOSIS — J209 Acute bronchitis, unspecified: Secondary | ICD-10-CM | POA: Diagnosis not present

## 2016-10-27 DIAGNOSIS — J301 Allergic rhinitis due to pollen: Secondary | ICD-10-CM | POA: Diagnosis not present

## 2016-12-04 DIAGNOSIS — J9801 Acute bronchospasm: Secondary | ICD-10-CM | POA: Diagnosis not present

## 2016-12-04 DIAGNOSIS — J101 Influenza due to other identified influenza virus with other respiratory manifestations: Secondary | ICD-10-CM | POA: Diagnosis not present

## 2016-12-04 DIAGNOSIS — M791 Myalgia: Secondary | ICD-10-CM | POA: Diagnosis not present

## 2016-12-05 ENCOUNTER — Ambulatory Visit (INDEPENDENT_AMBULATORY_CARE_PROVIDER_SITE_OTHER): Payer: 59 | Admitting: Ophthalmology

## 2016-12-08 DIAGNOSIS — R7301 Impaired fasting glucose: Secondary | ICD-10-CM | POA: Diagnosis not present

## 2016-12-08 DIAGNOSIS — M791 Myalgia: Secondary | ICD-10-CM | POA: Diagnosis not present

## 2016-12-08 DIAGNOSIS — R29898 Other symptoms and signs involving the musculoskeletal system: Secondary | ICD-10-CM | POA: Diagnosis not present

## 2016-12-08 DIAGNOSIS — E7800 Pure hypercholesterolemia, unspecified: Secondary | ICD-10-CM | POA: Diagnosis not present

## 2016-12-12 ENCOUNTER — Ambulatory Visit (INDEPENDENT_AMBULATORY_CARE_PROVIDER_SITE_OTHER): Payer: Medicare Other | Admitting: Ophthalmology

## 2016-12-12 DIAGNOSIS — I10 Essential (primary) hypertension: Secondary | ICD-10-CM | POA: Diagnosis not present

## 2016-12-12 DIAGNOSIS — H35372 Puckering of macula, left eye: Secondary | ICD-10-CM

## 2016-12-12 DIAGNOSIS — H338 Other retinal detachments: Secondary | ICD-10-CM | POA: Diagnosis not present

## 2016-12-12 DIAGNOSIS — H35033 Hypertensive retinopathy, bilateral: Secondary | ICD-10-CM | POA: Diagnosis not present

## 2016-12-12 DIAGNOSIS — H43811 Vitreous degeneration, right eye: Secondary | ICD-10-CM

## 2017-03-03 DIAGNOSIS — R7301 Impaired fasting glucose: Secondary | ICD-10-CM | POA: Diagnosis not present

## 2017-03-03 DIAGNOSIS — E7800 Pure hypercholesterolemia, unspecified: Secondary | ICD-10-CM | POA: Diagnosis not present

## 2017-03-06 DIAGNOSIS — E7800 Pure hypercholesterolemia, unspecified: Secondary | ICD-10-CM | POA: Diagnosis not present

## 2017-03-06 DIAGNOSIS — I2510 Atherosclerotic heart disease of native coronary artery without angina pectoris: Secondary | ICD-10-CM | POA: Diagnosis not present

## 2017-03-06 DIAGNOSIS — I10 Essential (primary) hypertension: Secondary | ICD-10-CM | POA: Diagnosis not present

## 2017-03-27 ENCOUNTER — Encounter (INDEPENDENT_AMBULATORY_CARE_PROVIDER_SITE_OTHER): Payer: Self-pay | Admitting: Orthopedic Surgery

## 2017-03-27 ENCOUNTER — Ambulatory Visit (INDEPENDENT_AMBULATORY_CARE_PROVIDER_SITE_OTHER): Payer: Medicare Other

## 2017-03-27 ENCOUNTER — Ambulatory Visit (INDEPENDENT_AMBULATORY_CARE_PROVIDER_SITE_OTHER): Payer: Medicare Other | Admitting: Orthopedic Surgery

## 2017-03-27 ENCOUNTER — Telehealth (INDEPENDENT_AMBULATORY_CARE_PROVIDER_SITE_OTHER): Payer: Self-pay | Admitting: Orthopedic Surgery

## 2017-03-27 DIAGNOSIS — M79671 Pain in right foot: Secondary | ICD-10-CM | POA: Diagnosis not present

## 2017-03-27 DIAGNOSIS — M25562 Pain in left knee: Secondary | ICD-10-CM | POA: Diagnosis not present

## 2017-03-27 MED ORDER — DICLOFENAC EPOLAMINE 1.3 % TD PTCH: 1.0000 | MEDICATED_PATCH | Freq: Two times a day (BID) | TRANSDERMAL | 2 refills | Status: DC

## 2017-03-27 MED ORDER — DICLOFENAC EPOLAMINE 1.3 % TD PTCH: 1.0000 | MEDICATED_PATCH | TRANSDERMAL | Status: DC

## 2017-03-27 NOTE — Telephone Encounter (Signed)
Pt stated he wanted his med to go to [facility] on [address] and to please update his chart.

## 2017-03-27 NOTE — Telephone Encounter (Signed)
done

## 2017-03-29 NOTE — Progress Notes (Signed)
Office Visit Note   Patient: Rick Turner           Date of Birth: 11/17/1950           MRN: [id number] Visit Date: 03/27/2017 Requested by: [name], MD [address] [place], [place] [address] PCP: [name], MD  Subjective: Chief Complaint  Patient presents with  . Left Knee - Pain  . Right Foot - Pain    HPI: Rick Turner is a 66 year old patient with left knee pain.  He is exercising 6 weeks ago and afterwards had some recurrent knee pain.  Reports posterior and medial pulling type pain.  It has gotten some better.  He reports some difficulty going upstairs.  Uneven ground causes increased pain and some catching.  He takes ibuprofen for his symptoms.  Twisting hurts it.  He uses a brace to play golf.  Patient also describes right foot pain.  5 days ago he noticed some pain and bruising around the second metatarsal.  This happened after he was cutting down a tree.  Denies any discrete history of injury but does report that he was very active that day.              ROS: All systems reviewed are negative as they relate to the chief complaint within the history of present illness.  Patient denies  fevers or chills.   Assessment & Plan: Visit Diagnoses:  1. Acute pain of left knee   2. Right foot pain     Plan: Impression is right foot pain with second MTP swelling and bruising.  This likely represents an occult fracture which we cannot see on plain radiographs.  He is wearing reasonably hard soled shoes.  I think symptomatic treatment is indicated at this time.  The extensor mechanism to the toes intact.  I would avoid doing anything on the balls of his feet but normal walking should be fine.  In regards to the knee he has no real arthritis on radiographs and no effusion.  He states that Flector patches that helped him in the past and those are prescribed today.  If his symptoms worsen then injection and MRI scanning is indicated.  I'll see him back as  needed  Follow-Up Instructions: Return if symptoms worsen or fail to improve.   Orders:  Orders Placed This Encounter  Procedures  . XR KNEE 3 VIEW LEFT  . XR Foot Complete Right   Meds ordered this encounter  Medications  . DISCONTD: diclofenac (FLECTOR) 1.3 % 1 patch  . diclofenac (FLECTOR) 1.3 % PTCH    Sig: Place 1 patch onto the skin 2 (two) times daily.    Dispense:  30 patch    Refill:  2      Procedures: No procedures performed   Clinical Data: No additional findings.  Objective: Vital Signs: There were no vitals taken for this visit.  Physical Exam:   Constitutional: Patient appears well-developed HEENT:  Head: Normocephalic Eyes:EOM are normal Neck: Normal range of motion Cardiovascular: Normal rate Pulmonary/chest: Effort normal Neurologic: Patient is alert Skin: Skin is warm Psychiatric: Patient has normal mood and affect    Ortho Exam: Orthopedic exam demonstrates palpable pedal pulses in that right foot he does have bruising and ecchymosis over the second MTP joint.  Toe flexion and extension is intact in this region.  No pain with pronation supination of the forefoot.  Much in the way of significant pain with toe range of motion except  at that second MTP joint.  The joint itself is located.  Left knee is examined.  No effusion.  Extensor mechanism is intact.  Collateral and cruciate ligaments are stable.  No other masses lymph and after skin changes noted in the left knee region.  Range of motion is full.  No groin pain with internal/external rotation of the leg.  No focal joint line tenderness is present.  Mild patellofemoral crepitus is present.  Specialty Comments:  No specialty comments available.  Imaging: No results found.   PMFS History: Patient Active Problem List   Diagnosis Date Noted  . 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 Dry eye syndrome 08/20/2013  . Nuclear sclerotic cataract 08/20/2013  . Pseudoaphakia  08/20/2013  . Detached retina 08/20/2013   Past Medical History:  Diagnosis Date  . Acne rosacea   . BPH (benign prostatic hypertrophy)   . Clavicle fracture    left  . Detached retina    left   . Fx ankle    left  . Hyperlipidemia   . Hypertension   . Hypothyroidism   . OSA (obstructive sleep apnea)    mild  . Seasonal allergies     Family History  Problem Relation Age of Onset  . CAD Mother   . CAD Father   . Hypertension Brother   . Hypertension Brother     Past Surgical History:  Procedure Laterality Date  . CATARACT EXTRACTION     left  . detached retiina     left  . EYE SURGERY     lasix  . TONSILLECTOMY     Social History   Occupational History  . Not on file.   Social History Main Topics  . Smoking status: Never Smoker  . Smokeless tobacco: Never Used  . Alcohol use Not on file  . Drug use: Unknown  . Sexual activity: Not on file

## 2017-03-30 ENCOUNTER — Other Ambulatory Visit (INDEPENDENT_AMBULATORY_CARE_PROVIDER_SITE_OTHER): Payer: Self-pay

## 2017-03-30 MED ORDER — DICLOFENAC SODIUM 3 % TD GEL: TRANSDERMAL | 3 refills | Status: DC

## 2017-03-31 ENCOUNTER — Other Ambulatory Visit (INDEPENDENT_AMBULATORY_CARE_PROVIDER_SITE_OTHER): Payer: Self-pay

## 2017-03-31 MED ORDER — DICLOFENAC SODIUM 1 % TD GEL: TRANSDERMAL | 2 refills | Status: DC

## 2017-04-18 DIAGNOSIS — N5201 Erectile dysfunction due to arterial insufficiency: Secondary | ICD-10-CM | POA: Diagnosis not present

## 2017-04-18 DIAGNOSIS — N475 Adhesions of prepuce and glans penis: Secondary | ICD-10-CM | POA: Diagnosis not present

## 2017-04-18 DIAGNOSIS — N471 Phimosis: Secondary | ICD-10-CM | POA: Diagnosis not present

## 2017-05-29 DIAGNOSIS — T63481A Toxic effect of venom of other arthropod, accidental (unintentional), initial encounter: Secondary | ICD-10-CM | POA: Diagnosis not present

## 2017-05-29 DIAGNOSIS — R1030 Lower abdominal pain, unspecified: Secondary | ICD-10-CM | POA: Diagnosis not present

## 2017-07-06 DIAGNOSIS — M25562 Pain in left knee: Secondary | ICD-10-CM | POA: Diagnosis not present

## 2017-07-06 DIAGNOSIS — G8929 Other chronic pain: Secondary | ICD-10-CM | POA: Diagnosis not present

## 2017-07-06 DIAGNOSIS — M79605 Pain in left leg: Secondary | ICD-10-CM | POA: Diagnosis not present

## 2017-07-08 ENCOUNTER — Other Ambulatory Visit (INDEPENDENT_AMBULATORY_CARE_PROVIDER_SITE_OTHER): Payer: Self-pay | Admitting: Orthopedic Surgery

## 2017-07-11 NOTE — Telephone Encounter (Signed)
Ok to rf? 

## 2017-07-11 NOTE — Telephone Encounter (Signed)
Okay to refill? 

## 2017-07-18 DIAGNOSIS — G8929 Other chronic pain: Secondary | ICD-10-CM | POA: Diagnosis not present

## 2017-07-18 DIAGNOSIS — M25562 Pain in left knee: Secondary | ICD-10-CM | POA: Diagnosis not present

## 2017-07-21 DIAGNOSIS — G8929 Other chronic pain: Secondary | ICD-10-CM | POA: Diagnosis not present

## 2017-07-21 DIAGNOSIS — M25562 Pain in left knee: Secondary | ICD-10-CM | POA: Diagnosis not present

## 2017-07-24 DIAGNOSIS — M25562 Pain in left knee: Secondary | ICD-10-CM | POA: Diagnosis not present

## 2017-07-24 DIAGNOSIS — G8929 Other chronic pain: Secondary | ICD-10-CM | POA: Diagnosis not present

## 2017-08-03 DIAGNOSIS — M25562 Pain in left knee: Secondary | ICD-10-CM | POA: Diagnosis not present

## 2017-08-03 DIAGNOSIS — M79605 Pain in left leg: Secondary | ICD-10-CM | POA: Diagnosis not present

## 2017-08-03 DIAGNOSIS — G8929 Other chronic pain: Secondary | ICD-10-CM | POA: Diagnosis not present

## 2017-08-14 DIAGNOSIS — M79605 Pain in left leg: Secondary | ICD-10-CM | POA: Diagnosis not present

## 2017-08-21 DIAGNOSIS — G8929 Other chronic pain: Secondary | ICD-10-CM | POA: Diagnosis not present

## 2017-08-21 DIAGNOSIS — M25562 Pain in left knee: Secondary | ICD-10-CM | POA: Diagnosis not present

## 2017-08-23 DIAGNOSIS — I2510 Atherosclerotic heart disease of native coronary artery without angina pectoris: Secondary | ICD-10-CM | POA: Diagnosis not present

## 2017-08-23 DIAGNOSIS — K219 Gastro-esophageal reflux disease without esophagitis: Secondary | ICD-10-CM | POA: Diagnosis not present

## 2017-08-23 DIAGNOSIS — E039 Hypothyroidism, unspecified: Secondary | ICD-10-CM | POA: Diagnosis not present

## 2017-08-23 DIAGNOSIS — J301 Allergic rhinitis due to pollen: Secondary | ICD-10-CM | POA: Diagnosis not present

## 2017-08-23 DIAGNOSIS — N400 Enlarged prostate without lower urinary tract symptoms: Secondary | ICD-10-CM | POA: Diagnosis not present

## 2017-08-23 DIAGNOSIS — E7800 Pure hypercholesterolemia, unspecified: Secondary | ICD-10-CM | POA: Diagnosis not present

## 2017-08-23 DIAGNOSIS — I10 Essential (primary) hypertension: Secondary | ICD-10-CM | POA: Diagnosis not present

## 2017-08-23 DIAGNOSIS — Z23 Encounter for immunization: Secondary | ICD-10-CM | POA: Diagnosis not present

## 2017-08-29 ENCOUNTER — Ambulatory Visit (INDEPENDENT_AMBULATORY_CARE_PROVIDER_SITE_OTHER): Payer: Medicare Other | Admitting: Orthopedic Surgery

## 2017-08-29 ENCOUNTER — Encounter (INDEPENDENT_AMBULATORY_CARE_PROVIDER_SITE_OTHER): Payer: Self-pay | Admitting: Orthopedic Surgery

## 2017-08-29 DIAGNOSIS — S838X2D Sprain of other specified parts of left knee, subsequent encounter: Secondary | ICD-10-CM

## 2017-08-29 DIAGNOSIS — M1712 Unilateral primary osteoarthritis, left knee: Secondary | ICD-10-CM | POA: Diagnosis not present

## 2017-08-29 MED ORDER — BUPIVACAINE HCL 0.25 % IJ SOLN
4.0000 mL | INTRAMUSCULAR | Status: AC | PRN
  Administered 2017-08-29: 4 mL via INTRA_ARTICULAR

## 2017-08-29 MED ORDER — LIDOCAINE HCL 1 % IJ SOLN
5.0000 mL | INTRAMUSCULAR | Status: AC | PRN
  Administered 2017-08-29: 5 mL

## 2017-08-29 MED ORDER — METHYLPREDNISOLONE ACETATE 40 MG/ML IJ SUSP
40.0000 mg | INTRAMUSCULAR | Status: AC | PRN
Start: 2017-08-29 — End: 2017-08-29
  Administered 2017-08-29: 40 mg via INTRA_ARTICULAR

## 2017-08-29 NOTE — Progress Notes (Signed)
Office Visit Note   Patient: Rick Turner           Date of Birth: [birth year]           MRN: [id number] Visit Date: 08/29/2017 Requested by: [name], MD [address] [place], [place] [address] PCP: [name], MD  Subjective: Chief Complaint  Patient presents with  . Left Knee - Follow-up    HPI: Rick Turner is a 66 year old patient with left knee pain.  Since I have seen him he has had an MRI scan the report of which is available today.  That scan shows degenerative meniscal tearing medially and laterally along with arthritis in the medial and lateral compartment.  He has never had an injection.  He reports pain but no definite mechanical symptoms and most of his pain localizes to the medial side              ROS: All systems reviewed are negative as they relate to the chief complaint within the history of present illness.  Patient denies  fevers or chills.   Assessment & Plan: Visit Diagnoses:  1. Unilateral primary osteoarthritis, left knee   2. Injury of meniscus of left knee, subsequent encounter     Plan: Impression is left knee pain with degeneration and meniscal pathology and early arthritis.  Plan is injection today with 8-week return to determine whether or not arthroscopic debridement would be indicated.  He is in the gray zone in terms of whether or not arthroscopy would be beneficial.  I will see him back after that intervention which is done today.  Follow-Up Instructions: Return in about 8 weeks (around 10/24/2017).   Orders:  No orders of the defined types were placed in this encounter.  No orders of the defined types were placed in this encounter.     Procedures: Large Joint Inj Date/Time: 08/29/2017 11:31 AM Performed by: Meredith Pel Authorized by: Meredith Pel   Consent Given by:  Patient Site marked: the procedure site was marked   Timeout: prior to procedure the correct patient, procedure, and site was verified    Indications:  Pain, joint swelling and diagnostic evaluation Location:  Knee Site:  L knee Prep: patient was prepped and draped in usual sterile fashion   Needle Size:  18 G Needle Length:  1.5 inches Approach:  Superolateral Ultrasound Guidance: No   Fluoroscopic Guidance: No   Arthrogram: No   Medications:  5 mL lidocaine 1 %; 4 mL bupivacaine 0.25 %; 40 mg methylPREDNISolone acetate 40 MG/ML Patient tolerance:  Patient tolerated the procedure well with no immediate complications     Clinical Data: No additional findings.  Objective: Vital Signs: There were no vitals taken for this visit.  Physical Exam:   Constitutional: Patient appears well-developed HEENT:  Head: Normocephalic Eyes:EOM are normal Neck: Normal range of motion Cardiovascular: Normal rate Pulmonary/chest: Effort normal Neurologic: Patient is alert Skin: Skin is warm Psychiatric: Patient has normal mood and affect    Ortho Exam: Orthopedic exam demonstrates slight flexion contracture of left knee 5 degrees compared to right knee which is straight.  Trace effusion present.  Extensor mechanism is intact on the left.  Collateral and cruciate ligaments are stable and there is no discrete joint line tenderness.  No groin pain with internal/external rotation of the left leg.  No other masses lymphadenopathy or skin changes noted in the left leg region  Specialty Comments:  No specialty comments available.  Imaging: No  results found.   PMFS History: Patient Active Problem List   Diagnosis Date Noted  . 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 Dry eye syndrome 08/20/2013  . Nuclear sclerotic cataract 08/20/2013  . Pseudoaphakia 08/20/2013  . Detached retina 08/20/2013   Past Medical History:  Diagnosis Date  . Acne rosacea   . BPH (benign prostatic hypertrophy)   . Clavicle fracture    left  . Detached retina    left   . Fx ankle    left  . Hyperlipidemia   . Hypertension    . Hypothyroidism   . OSA (obstructive sleep apnea)    mild  . Seasonal allergies     Family History  Problem Relation Age of Onset  . CAD Mother   . CAD Father   . Hypertension Brother   . Hypertension Brother     Past Surgical History:  Procedure Laterality Date  . CATARACT EXTRACTION     left  . detached retiina     left  . EYE SURGERY     lasix  . TONSILLECTOMY     Social History   Occupational History  . Not on file.   Social History Main Topics  . Smoking status: Never Smoker  . Smokeless tobacco: Never Used  . Alcohol use Not on file  . Drug use: Unknown  . Sexual activity: Not on file

## 2017-09-07 DIAGNOSIS — Z23 Encounter for immunization: Secondary | ICD-10-CM | POA: Diagnosis not present

## 2017-09-07 DIAGNOSIS — Z0000 Encounter for general adult medical examination without abnormal findings: Secondary | ICD-10-CM | POA: Diagnosis not present

## 2017-09-07 DIAGNOSIS — I10 Essential (primary) hypertension: Secondary | ICD-10-CM | POA: Diagnosis not present

## 2017-09-07 DIAGNOSIS — N400 Enlarged prostate without lower urinary tract symptoms: Secondary | ICD-10-CM | POA: Diagnosis not present

## 2017-09-07 DIAGNOSIS — E7800 Pure hypercholesterolemia, unspecified: Secondary | ICD-10-CM | POA: Diagnosis not present

## 2017-09-07 DIAGNOSIS — E039 Hypothyroidism, unspecified: Secondary | ICD-10-CM | POA: Diagnosis not present

## 2017-09-07 DIAGNOSIS — Z1389 Encounter for screening for other disorder: Secondary | ICD-10-CM | POA: Diagnosis not present

## 2017-09-07 DIAGNOSIS — E669 Obesity, unspecified: Secondary | ICD-10-CM | POA: Diagnosis not present

## 2017-09-07 DIAGNOSIS — I2510 Atherosclerotic heart disease of native coronary artery without angina pectoris: Secondary | ICD-10-CM | POA: Diagnosis not present

## 2017-09-07 DIAGNOSIS — Z125 Encounter for screening for malignant neoplasm of prostate: Secondary | ICD-10-CM | POA: Diagnosis not present

## 2017-09-10 ENCOUNTER — Other Ambulatory Visit (INDEPENDENT_AMBULATORY_CARE_PROVIDER_SITE_OTHER): Payer: Self-pay | Admitting: Orthopedic Surgery

## 2017-09-22 DIAGNOSIS — I10 Essential (primary) hypertension: Secondary | ICD-10-CM | POA: Diagnosis not present

## 2017-09-22 DIAGNOSIS — Z125 Encounter for screening for malignant neoplasm of prostate: Secondary | ICD-10-CM | POA: Diagnosis not present

## 2017-09-22 DIAGNOSIS — E7800 Pure hypercholesterolemia, unspecified: Secondary | ICD-10-CM | POA: Diagnosis not present

## 2017-09-22 DIAGNOSIS — E039 Hypothyroidism, unspecified: Secondary | ICD-10-CM | POA: Diagnosis not present

## 2017-09-25 DIAGNOSIS — Z9849 Cataract extraction status, unspecified eye: Secondary | ICD-10-CM | POA: Diagnosis not present

## 2017-09-25 DIAGNOSIS — H35372 Puckering of macula, left eye: Secondary | ICD-10-CM | POA: Diagnosis not present

## 2017-09-25 DIAGNOSIS — H5213 Myopia, bilateral: Secondary | ICD-10-CM | POA: Diagnosis not present

## 2017-09-25 DIAGNOSIS — Z961 Presence of intraocular lens: Secondary | ICD-10-CM | POA: Diagnosis not present

## 2017-09-25 DIAGNOSIS — H52223 Regular astigmatism, bilateral: Secondary | ICD-10-CM | POA: Diagnosis not present

## 2017-09-25 DIAGNOSIS — H524 Presbyopia: Secondary | ICD-10-CM | POA: Diagnosis not present

## 2017-10-04 DIAGNOSIS — N400 Enlarged prostate without lower urinary tract symptoms: Secondary | ICD-10-CM | POA: Diagnosis not present

## 2017-10-04 DIAGNOSIS — I10 Essential (primary) hypertension: Secondary | ICD-10-CM | POA: Diagnosis not present

## 2017-10-04 DIAGNOSIS — I2510 Atherosclerotic heart disease of native coronary artery without angina pectoris: Secondary | ICD-10-CM | POA: Diagnosis not present

## 2017-10-04 DIAGNOSIS — E039 Hypothyroidism, unspecified: Secondary | ICD-10-CM | POA: Diagnosis not present

## 2017-10-27 ENCOUNTER — Ambulatory Visit (INDEPENDENT_AMBULATORY_CARE_PROVIDER_SITE_OTHER): Payer: Medicare Other | Admitting: Orthopedic Surgery

## 2017-11-15 ENCOUNTER — Ambulatory Visit (INDEPENDENT_AMBULATORY_CARE_PROVIDER_SITE_OTHER): Payer: Medicare Other

## 2017-11-15 ENCOUNTER — Ambulatory Visit (INDEPENDENT_AMBULATORY_CARE_PROVIDER_SITE_OTHER): Payer: Medicare Other | Admitting: Orthopedic Surgery

## 2017-11-15 DIAGNOSIS — M545 Low back pain: Secondary | ICD-10-CM | POA: Diagnosis not present

## 2017-11-15 DIAGNOSIS — M25511 Pain in right shoulder: Secondary | ICD-10-CM

## 2017-11-15 DIAGNOSIS — G8929 Other chronic pain: Secondary | ICD-10-CM | POA: Diagnosis not present

## 2017-11-15 DIAGNOSIS — M1712 Unilateral primary osteoarthritis, left knee: Secondary | ICD-10-CM | POA: Diagnosis not present

## 2017-11-15 NOTE — Progress Notes (Signed)
Office Visit Note   Patient: Rick Turner           Date of Birth: [birth year]           MRN: [id number] Visit Date: 11/15/2017 Requested by: [name], MD [address] [place], [place] [address] PCP: [name], MD  Subjective: Chief Complaint  Patient presents with  . Left Knee - Follow-up    HPI: Tasha is a patient with multiple orthopedic complaints today.  He reports left knee pain and right shoulder pain and low back pain and leg weakness.  Patient has a known history of left knee arthritis.  States that his pain hurts him after spin class and golf.  The pain is posterior in the left knee.  Injections have helped him in the past.  Radiographs do show some arthritis in the knee.  Patient also describes long history of right shoulder pain.  He does do exercises.  He likes to lift weights.  He reports some diminished range of motion but not much in the way of diminished strength.  Denies any neck pain or radicular symptoms.  Patient also reports mild low back pain but primarily bilateral leg weakness with inability to get out of chair at times.  He does lift weights but he finds this leg weakness and mild back pain atypical and disturbing.              ROS: All systems reviewed are negative as they relate to the chief complaint within the history of present illness.  Patient denies  fevers or chills.   Assessment & Plan: Visit Diagnoses:  1. Chronic midline low back pain, with sciatica presence unspecified   2. Right shoulder pain, unspecified chronicity   3. Unilateral primary osteoarthritis, left knee     Plan: Impression is left knee pain with arthritis.  Injection 12/30/2016 gave good relief and we will repeat that injection today as well as preapproved him for gel injection.  In regards to the shoulder he has glenohumeral arthritis.  We will inject the right shoulder glenohumeral joint today as well.  In regards to the back and legs he has  excellent motor strength in the hip flexors and no real muscle atrophy but does describe this atypical leg weakness when he is walking.  Needs MRI of the lumbar spine to evaluate for possible spinal stenosis.  I will see him back after those studies.  Follow-Up Instructions: Return for after MRI.   Orders:  Orders Placed This Encounter  Procedures  . XR Shoulder Right  . XR Lumbar Spine 2-3 Views  . MR Lumbar Spine w/o contrast   No orders of the defined types were placed in this encounter.     Procedures: Large Joint Inj: L knee on 11/17/2017 8:42 PM Indications: diagnostic evaluation, joint swelling and pain Details: 18 G 1.5 in needle, superolateral approach  Arthrogram: No  Medications: 5 mL lidocaine 1 %; 40 mg methylPREDNISolone acetate 40 MG/ML; 4 mL bupivacaine 0.25 % Outcome: tolerated well, no immediate complications Procedure, treatment alternatives, risks and benefits explained, specific risks discussed. Consent was given by the patient. Immediately prior to procedure a time out was called to verify the correct patient, procedure, equipment, support staff and site/side marked as required. Patient was prepped and draped in the usual sterile fashion.   Large Joint Inj: R glenohumeral on 11/17/2017 8:42 PM Indications: diagnostic evaluation and pain Details: 18 G 1.5 in needle, posterior approach  Arthrogram: No  Medications: 9 mL bupivacaine 0.5 %; 40 mg methylPREDNISolone acetate 40 MG/ML; 5 mL lidocaine 1 % Outcome: tolerated well, no immediate complications Procedure, treatment alternatives, risks and benefits explained, specific risks discussed. Consent was given by the patient. Immediately prior to procedure a time out was called to verify the correct patient, procedure, equipment, support staff and site/side marked as required. Patient was prepped and draped in the usual sterile fashion.       Clinical Data: No additional findings.  Objective: Vital Signs:  There were no vitals taken for this visit.  Physical Exam:   Constitutional: Patient appears well-developed HEENT:  Head: Normocephalic Eyes:EOM are normal Neck: Normal range of motion Cardiovascular: Normal rate Pulmonary/chest: Effort normal Neurologic: Patient is alert Skin: Skin is warm Psychiatric: Patient has normal mood and affect    Ortho Exam: Orthopedic exam demonstrates 5 out of 5 ankle dorsiflexion plantarflexion quad hamstring strength with mild left knee effusion and mild medial and lateral joint line tenderness to that left knee.  No real muscle atrophy in the upper or lower legs.  No groin pain with internal/external rotation of the legs on either side.  Hip flexion strength is intact.  No other masses lymphadenopathy or skin changes noted in the leg regions.  No trochanteric tenderness is noted.  Reflexes symmetric.  Negative Babinski negative clonus.  Examination of the right shoulder demonstrates some restriction of full forward flexion and external rotation but excellent rotator cuff strength is present.  No AC joint tenderness is noted.  Left shoulder has full range of motion.  Not much in the way of coarseness with passive range of motion above 90 degrees.  Specialty Comments:  No specialty comments available.  Imaging: No results found.   PMFS History: Patient Active Problem List   Diagnosis Date Noted  . 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 Dry eye syndrome 08/20/2013  . Nuclear sclerotic cataract 08/20/2013  . Pseudoaphakia 08/20/2013  . Detached retina 08/20/2013   Past Medical History:  Diagnosis Date  . Acne rosacea   . BPH (benign prostatic hypertrophy)   . Clavicle fracture    left  . Detached retina    left   . Fx ankle    left  . Hyperlipidemia   . Hypertension   . Hypothyroidism   . OSA (obstructive sleep apnea)    mild  . Seasonal allergies     Family History  Problem Relation Age of Onset  . CAD Mother   .  CAD Father   . Hypertension Brother   . Hypertension Brother     Past Surgical History:  Procedure Laterality Date  . CATARACT EXTRACTION     left  . detached retiina     left  . EYE SURGERY     lasix  . TONSILLECTOMY     Social History   Occupational History  . Not on file  Tobacco Use  . Smoking status: Never Smoker  . Smokeless tobacco: Never Used  Substance and Sexual Activity  . Alcohol use: Not on file  . Drug use: Not on file  . Sexual activity: Not on file

## 2017-11-17 ENCOUNTER — Encounter (INDEPENDENT_AMBULATORY_CARE_PROVIDER_SITE_OTHER): Payer: Self-pay | Admitting: Orthopedic Surgery

## 2017-11-17 DIAGNOSIS — M1712 Unilateral primary osteoarthritis, left knee: Secondary | ICD-10-CM

## 2017-11-17 DIAGNOSIS — G8929 Other chronic pain: Secondary | ICD-10-CM | POA: Diagnosis not present

## 2017-11-17 DIAGNOSIS — M25511 Pain in right shoulder: Secondary | ICD-10-CM

## 2017-11-17 DIAGNOSIS — M545 Low back pain: Secondary | ICD-10-CM | POA: Diagnosis not present

## 2017-11-17 MED ORDER — BUPIVACAINE HCL 0.5 % IJ SOLN
9.0000 mL | INTRAMUSCULAR | Status: AC | PRN
  Administered 2017-11-17: 9 mL via INTRA_ARTICULAR

## 2017-11-17 MED ORDER — BUPIVACAINE HCL 0.25 % IJ SOLN
4.0000 mL | INTRAMUSCULAR | Status: AC | PRN
  Administered 2017-11-17: 4 mL via INTRA_ARTICULAR

## 2017-11-17 MED ORDER — LIDOCAINE HCL 1 % IJ SOLN
5.0000 mL | INTRAMUSCULAR | Status: AC | PRN
  Administered 2017-11-17: 5 mL

## 2017-11-17 MED ORDER — METHYLPREDNISOLONE ACETATE 40 MG/ML IJ SUSP
40.0000 mg | INTRAMUSCULAR | Status: AC | PRN
  Administered 2017-11-17: 40 mg via INTRA_ARTICULAR

## 2017-11-29 ENCOUNTER — Ambulatory Visit
Admission: RE | Admit: 2017-11-29 | Discharge: 2017-11-29 | Disposition: A | Discharge status: Home / Self Care | Payer: Medicare Other | Source: Ambulatory Visit | Attending: Orthopedic Surgery | Admitting: Orthopedic Surgery

## 2017-11-29 DIAGNOSIS — M545 Low back pain: Principal | ICD-10-CM

## 2017-11-29 DIAGNOSIS — M48061 Spinal stenosis, lumbar region without neurogenic claudication: Secondary | ICD-10-CM | POA: Diagnosis not present

## 2017-11-29 DIAGNOSIS — G8929 Other chronic pain: Secondary | ICD-10-CM

## 2017-12-06 ENCOUNTER — Ambulatory Visit (INDEPENDENT_AMBULATORY_CARE_PROVIDER_SITE_OTHER): Payer: Medicare Other | Admitting: Orthopedic Surgery

## 2017-12-06 ENCOUNTER — Encounter (INDEPENDENT_AMBULATORY_CARE_PROVIDER_SITE_OTHER): Payer: Self-pay | Admitting: Orthopedic Surgery

## 2017-12-06 DIAGNOSIS — R29898 Other symptoms and signs involving the musculoskeletal system: Secondary | ICD-10-CM

## 2017-12-08 NOTE — Progress Notes (Signed)
Office Visit Note   Patient: Rick Turner           Date of Birth: 1951/11/04           MRN: [id number] Visit Date: 12/06/2017 Requested by: [name], MD [address] [place], [place] [address] PCP: [name], MD  Subjective: Chief Complaint  Patient presents with  . Lower Back - Follow-up    HPI: Rick Turner is a 67 year old patient with some quad weakness.  Since I have seen him he has had an MRI scan which is reviewed today.  Patient notices the weakness going up and down stairs.  He also feels like he has been dropping things.  He has had his statin regimen changed a lot over the past several years.              ROS: All systems reviewed are negative as they relate to the chief complaint within the history of present illness.  Patient denies  fevers or chills.   Assessment & Plan: Visit Diagnoses:  1. Weakness of both lower extremities     Plan: Impression is weakness and clumsiness with no definitive corresponding findings on MRI scan.  MRI scan is reviewed with the patient and it shows just a small central disc protrusion at L4-5 without significant neural impingement plan is referral to a neurologist.  This could be related to the statins or it could be something more concerning like ALS.  I will see him back as needed.  Nothing orthopedic to do in terms of his back at this time  Follow-Up Instructions: Return if symptoms worsen or fail to improve.   Orders:  Orders Placed This Encounter  Procedures  . Ambulatory referral to Neurology   No orders of the defined types were placed in this encounter.     Procedures: No procedures performed   Clinical Data: No additional findings.  Objective: Vital Signs: There were no vitals taken for this visit.  Physical Exam:   Constitutional: Patient appears well-developed HEENT:  Head: Normocephalic Eyes:EOM are normal Neck: Normal range of motion Cardiovascular: Normal rate Pulmonary/chest:  Effort normal Neurologic: Patient is alert Skin: Skin is warm Psychiatric: Patient has normal mood and affect    Ortho Exam: Orthopedic exam demonstrates pretty good muscle strength in the upper and lower extremities.  No focal weakness.  Hip flexion strength is 5+ out of 5 to manual motor testing.  No paresthesias in the left or right leg.  No upper motor neuron symptoms in the lower extremities.  Normal gait and alignment.  Specialty Comments:  No specialty comments available.  Imaging: No results found.   PMFS History: Patient Active Problem List   Diagnosis Date Noted  . 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 Dry eye syndrome 08/20/2013  . Nuclear sclerotic cataract 08/20/2013  . Pseudoaphakia 08/20/2013  . Detached retina 08/20/2013   Past Medical History:  Diagnosis Date  . Acne rosacea   . BPH (benign prostatic hypertrophy)   . Clavicle fracture    left  . Detached retina    left   . Fx ankle    left  . Hyperlipidemia   . Hypertension   . Hypothyroidism   . OSA (obstructive sleep apnea)    mild  . Seasonal allergies     Family History  Problem Relation Age of Onset  . CAD Mother   . CAD Father   . Hypertension Brother   . Hypertension Brother  Past Surgical History:  Procedure Laterality Date  . CATARACT EXTRACTION     left  . detached retiina     left  . EYE SURGERY     lasix  . TONSILLECTOMY     Social History   Occupational History  . Not on file  Tobacco Use  . Smoking status: Never Smoker  . Smokeless tobacco: Never Used  Substance and Sexual Activity  . Alcohol use: Not on file  . Drug use: Not on file  . Sexual activity: Not on file

## 2017-12-12 ENCOUNTER — Ambulatory Visit (INDEPENDENT_AMBULATORY_CARE_PROVIDER_SITE_OTHER): Payer: Medicare Other | Admitting: Ophthalmology

## 2017-12-12 DIAGNOSIS — I10 Essential (primary) hypertension: Secondary | ICD-10-CM

## 2017-12-12 DIAGNOSIS — H338 Other retinal detachments: Secondary | ICD-10-CM | POA: Diagnosis not present

## 2017-12-12 DIAGNOSIS — H43811 Vitreous degeneration, right eye: Secondary | ICD-10-CM

## 2017-12-12 DIAGNOSIS — H35372 Puckering of macula, left eye: Secondary | ICD-10-CM | POA: Diagnosis not present

## 2017-12-12 DIAGNOSIS — H35033 Hypertensive retinopathy, bilateral: Secondary | ICD-10-CM

## 2017-12-21 DIAGNOSIS — I2510 Atherosclerotic heart disease of native coronary artery without angina pectoris: Secondary | ICD-10-CM | POA: Diagnosis not present

## 2017-12-21 DIAGNOSIS — I10 Essential (primary) hypertension: Secondary | ICD-10-CM | POA: Diagnosis not present

## 2017-12-21 DIAGNOSIS — N400 Enlarged prostate without lower urinary tract symptoms: Secondary | ICD-10-CM | POA: Diagnosis not present

## 2017-12-21 DIAGNOSIS — E039 Hypothyroidism, unspecified: Secondary | ICD-10-CM | POA: Diagnosis not present

## 2017-12-25 DIAGNOSIS — R29898 Other symptoms and signs involving the musculoskeletal system: Secondary | ICD-10-CM | POA: Diagnosis not present

## 2017-12-25 DIAGNOSIS — R5383 Other fatigue: Secondary | ICD-10-CM | POA: Diagnosis not present

## 2017-12-25 DIAGNOSIS — K4090 Unilateral inguinal hernia, without obstruction or gangrene, not specified as recurrent: Secondary | ICD-10-CM | POA: Diagnosis not present

## 2017-12-25 DIAGNOSIS — M79605 Pain in left leg: Secondary | ICD-10-CM | POA: Diagnosis not present

## 2017-12-25 DIAGNOSIS — R0683 Snoring: Secondary | ICD-10-CM | POA: Diagnosis not present

## 2017-12-25 DIAGNOSIS — M79604 Pain in right leg: Secondary | ICD-10-CM | POA: Diagnosis not present

## 2018-01-24 DIAGNOSIS — G4733 Obstructive sleep apnea (adult) (pediatric): Secondary | ICD-10-CM | POA: Diagnosis not present

## 2018-01-29 ENCOUNTER — Telehealth: Payer: Self-pay | Admitting: *Deleted

## 2018-01-29 ENCOUNTER — Ambulatory Visit: Payer: Medicare Other | Admitting: Neurology

## 2018-01-29 DIAGNOSIS — R0981 Nasal congestion: Secondary | ICD-10-CM | POA: Diagnosis not present

## 2018-01-29 DIAGNOSIS — J069 Acute upper respiratory infection, unspecified: Secondary | ICD-10-CM | POA: Diagnosis not present

## 2018-01-29 NOTE — Telephone Encounter (Signed)
Patient canceled new patient appt same day due to flu.

## 2018-01-30 ENCOUNTER — Other Ambulatory Visit (INDEPENDENT_AMBULATORY_CARE_PROVIDER_SITE_OTHER): Payer: Self-pay | Admitting: Orthopedic Surgery

## 2018-01-30 ENCOUNTER — Encounter: Payer: Self-pay | Admitting: Neurology

## 2018-01-30 NOTE — Telephone Encounter (Signed)
Ok to rf? 

## 2018-01-31 NOTE — Telephone Encounter (Signed)
Yes

## 2018-02-06 ENCOUNTER — Telehealth (INDEPENDENT_AMBULATORY_CARE_PROVIDER_SITE_OTHER): Payer: Self-pay | Admitting: Orthopedic Surgery

## 2018-02-06 NOTE — Telephone Encounter (Signed)
Ok for shot

## 2018-02-06 NOTE — Telephone Encounter (Signed)
Patient is requesting another injection in his left knee for the week of April 15th - he said his last injection was at his latest visit which was January 30th. Can you check and make sure to see if its okay to schedule him for another injection that week? He is wanting to get it earlier due to him leaving to go out of town for 2 weeks starting 4/22. Please advise if I can schedule or patient CB # [phone]

## 2018-02-06 NOTE — Telephone Encounter (Signed)
Ok for repeat injection this soon?

## 2018-02-06 NOTE — Telephone Encounter (Signed)
[place] for patient to be scheduled for injection per Dr Marlou Sa.

## 2018-02-06 NOTE — Telephone Encounter (Signed)
Left voicemail to call back and schedule appt for injection week of 4/15 with Dr. Marlou Sa

## 2018-02-19 ENCOUNTER — Encounter (INDEPENDENT_AMBULATORY_CARE_PROVIDER_SITE_OTHER): Payer: Self-pay | Admitting: Orthopedic Surgery

## 2018-02-19 ENCOUNTER — Ambulatory Visit (INDEPENDENT_AMBULATORY_CARE_PROVIDER_SITE_OTHER): Payer: Medicare Other | Admitting: Orthopedic Surgery

## 2018-02-19 DIAGNOSIS — M1712 Unilateral primary osteoarthritis, left knee: Secondary | ICD-10-CM | POA: Diagnosis not present

## 2018-02-19 MED ORDER — LIDOCAINE HCL 1 % IJ SOLN
5.0000 mL | INTRAMUSCULAR | Status: AC | PRN
  Administered 2018-02-19: 5 mL

## 2018-02-19 MED ORDER — METHYLPREDNISOLONE ACETATE 40 MG/ML IJ SUSP
40.0000 mg | INTRAMUSCULAR | Status: AC | PRN
  Administered 2018-02-19: 40 mg via INTRA_ARTICULAR

## 2018-02-19 MED ORDER — BUPIVACAINE HCL 0.25 % IJ SOLN
4.0000 mL | INTRAMUSCULAR | Status: AC | PRN
  Administered 2018-02-19: 4 mL via INTRA_ARTICULAR

## 2018-02-19 NOTE — Progress Notes (Signed)
Office Visit Note   Patient: Rick Turner           Date of Birth: [birth year]           MRN: [id number] Visit Date: 02/19/2018 Requested by: [name], MD [address] [place], [place] [address] PCP: [name], MD  Subjective: Chief Complaint  Patient presents with  . Left Knee - Follow-up    HPI: Rick Turner is a patient with left knee pain.  Requesting injection today.  Has known history of left knee arthritis.  Last injection in January.  He plays golf and would like to play.  Reports primarily medial sided pain.  Diclofenac gel helps his knee.  Patient also had right shoulder injection and that helped for 2-3 weeks.  Currently his shoulders not symptomatic enough to do any further intervention or imaging.              ROS: All systems reviewed are negative as they relate to the chief complaint within the history of present illness.  Patient denies  fevers or chills.   Assessment & Plan: Visit Diagnoses:  1. Unilateral primary osteoarthritis, left knee     Plan: Impression is left knee pain and right shoulder impingement bursitis possible rotator cuff tear.  Aspiration and injection of the left knee is performed today.  I will see him back as needed.  I do want to preapproved him for Synvisc or some type of gel injection.  Follow-Up Instructions: Return if symptoms worsen or fail to improve.   Orders:  No orders of the defined types were placed in this encounter.  No orders of the defined types were placed in this encounter.     Procedures: Large Joint Inj: L knee on 02/19/2018 8:00 PM Indications: diagnostic evaluation, joint swelling and pain Details: 18 G 1.5 in needle, superolateral approach  Arthrogram: No  Medications: 5 mL lidocaine 1 %; 40 mg methylPREDNISolone acetate 40 MG/ML; 4 mL bupivacaine 0.25 % Outcome: tolerated well, no immediate complications Procedure, treatment alternatives, risks and benefits explained, specific risks  discussed. Consent was given by the patient. Immediately prior to procedure a time out was called to verify the correct patient, procedure, equipment, support staff and site/side marked as required. Patient was prepped and draped in the usual sterile fashion.       Clinical Data: No additional findings.  Objective: Vital Signs: There were no vitals taken for this visit.  Physical Exam:   Constitutional: Patient appears well-developed HEENT:  Head: Normocephalic Eyes:EOM are normal Neck: Normal range of motion Cardiovascular: Normal rate Pulmonary/chest: Effort normal Neurologic: Patient is alert Skin: Skin is warm Psychiatric: Patient has normal mood and affect    Ortho Exam: Orthopedic exam demonstrates slight flexion contracture in the left knee.  Trace effusion is present.  Range of motion is to about 105 210 degrees.  Collateral cruciate ligaments are stable.  Right knee has pretty good extension left knee again is lacking about 5-6 degrees.  Pedal pulses palpable.  No other masses lymphadenopathy or skin changes noted in that left knee region  Specialty Comments:  No specialty comments available.  Imaging: No results found.   PMFS History: Patient Active Problem List   Diagnosis Date Noted  . 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 Dry eye syndrome 08/20/2013  . Nuclear sclerotic cataract 08/20/2013  . Pseudoaphakia 08/20/2013  . Detached retina 08/20/2013   Past Medical History:  Diagnosis Date  . Acne rosacea   .  BPH (benign prostatic hypertrophy)   . Clavicle fracture    left  . Detached retina    left   . Fx ankle    left  . Hyperlipidemia   . Hypertension   . Hypothyroidism   . OSA (obstructive sleep apnea)    mild  . Seasonal allergies     Family History  Problem Relation Age of Onset  . CAD Mother   . CAD Father   . Hypertension Brother   . Hypertension Brother     Past Surgical History:  Procedure Laterality Date    . CATARACT EXTRACTION     left  . detached retiina     left  . EYE SURGERY     lasix  . TONSILLECTOMY     Social History   Occupational History  . Not on file  Tobacco Use  . Smoking status: Never Smoker  . Smokeless tobacco: Never Used  Substance and Sexual Activity  . Alcohol use: Not on file  . Drug use: Not on file  . Sexual activity: Not on file

## 2018-03-14 DIAGNOSIS — R7989 Other specified abnormal findings of blood chemistry: Secondary | ICD-10-CM | POA: Diagnosis not present

## 2018-03-14 DIAGNOSIS — E291 Testicular hypofunction: Secondary | ICD-10-CM | POA: Diagnosis not present

## 2018-03-14 DIAGNOSIS — I10 Essential (primary) hypertension: Secondary | ICD-10-CM | POA: Diagnosis not present

## 2018-03-14 DIAGNOSIS — E7800 Pure hypercholesterolemia, unspecified: Secondary | ICD-10-CM | POA: Diagnosis not present

## 2018-03-14 DIAGNOSIS — G4733 Obstructive sleep apnea (adult) (pediatric): Secondary | ICD-10-CM | POA: Diagnosis not present

## 2018-03-14 DIAGNOSIS — Z789 Other specified health status: Secondary | ICD-10-CM | POA: Diagnosis not present

## 2018-03-14 DIAGNOSIS — I2510 Atherosclerotic heart disease of native coronary artery without angina pectoris: Secondary | ICD-10-CM | POA: Diagnosis not present

## 2018-03-14 DIAGNOSIS — K4090 Unilateral inguinal hernia, without obstruction or gangrene, not specified as recurrent: Secondary | ICD-10-CM | POA: Diagnosis not present

## 2018-04-10 DIAGNOSIS — E785 Hyperlipidemia, unspecified: Secondary | ICD-10-CM | POA: Diagnosis not present

## 2018-04-10 DIAGNOSIS — K4090 Unilateral inguinal hernia, without obstruction or gangrene, not specified as recurrent: Secondary | ICD-10-CM | POA: Diagnosis not present

## 2018-04-10 DIAGNOSIS — G4733 Obstructive sleep apnea (adult) (pediatric): Secondary | ICD-10-CM | POA: Diagnosis not present

## 2018-04-10 DIAGNOSIS — Z6835 Body mass index (BMI) 35.0-35.9, adult: Secondary | ICD-10-CM | POA: Diagnosis not present

## 2018-04-20 ENCOUNTER — Telehealth (INDEPENDENT_AMBULATORY_CARE_PROVIDER_SITE_OTHER): Payer: Self-pay | Admitting: Radiology

## 2018-04-20 NOTE — Telephone Encounter (Signed)
Approved for monovisc injection. LMOM for patient to call back and schedule.

## 2018-04-30 ENCOUNTER — Other Ambulatory Visit (INDEPENDENT_AMBULATORY_CARE_PROVIDER_SITE_OTHER): Payer: Self-pay | Admitting: Orthopedic Surgery

## 2018-05-02 ENCOUNTER — Ambulatory Visit (INDEPENDENT_AMBULATORY_CARE_PROVIDER_SITE_OTHER): Payer: Medicare Other | Admitting: Orthopedic Surgery

## 2018-05-02 ENCOUNTER — Encounter (INDEPENDENT_AMBULATORY_CARE_PROVIDER_SITE_OTHER): Payer: Self-pay | Admitting: Orthopedic Surgery

## 2018-05-02 DIAGNOSIS — M1712 Unilateral primary osteoarthritis, left knee: Secondary | ICD-10-CM | POA: Diagnosis not present

## 2018-05-02 MED ORDER — HYALURONAN 88 MG/4ML IX SOSY
88.0000 mg | PREFILLED_SYRINGE | INTRA_ARTICULAR | Status: AC | PRN
  Administered 2018-05-02: 88 mg via INTRA_ARTICULAR

## 2018-05-02 MED ORDER — LIDOCAINE HCL 1 % IJ SOLN
5.0000 mL | INTRAMUSCULAR | Status: AC | PRN
  Administered 2018-05-02: 5 mL

## 2018-05-02 NOTE — Progress Notes (Signed)
   Procedure Note  Patient: Rick Turner             Date of Birth: February 13, 1951           MRN: [id number]             Visit Date: 05/02/2018  Procedures: Visit Diagnoses: Unilateral primary osteoarthritis, left knee  Large Joint Inj: L knee on 05/02/2018 10:35 AM Indications: diagnostic evaluation, joint swelling and pain Details: 18 G 1.5 in needle, superolateral approach  Arthrogram: No  Medications: 5 mL lidocaine 1 %; 88 mg Hyaluronan 88 MG/4ML Outcome: tolerated well, no immediate complications Procedure, treatment alternatives, risks and benefits explained, specific risks discussed. Consent was given by the patient. Immediately prior to procedure a time out was called to verify the correct patient, procedure, equipment, support staff and site/side marked as required. Patient was prepped and draped in the usual sterile fashion.

## 2018-05-25 ENCOUNTER — Encounter: Payer: Self-pay | Admitting: Podiatry

## 2018-05-25 ENCOUNTER — Ambulatory Visit (INDEPENDENT_AMBULATORY_CARE_PROVIDER_SITE_OTHER): Payer: Medicare Other | Admitting: Podiatry

## 2018-05-25 DIAGNOSIS — L600 Ingrowing nail: Secondary | ICD-10-CM | POA: Diagnosis not present

## 2018-05-25 MED ORDER — NEOMYCIN-POLYMYXIN-HC 3.5-10000-1 OT SOLN: OTIC | 0 refills | Status: DC

## 2018-05-25 NOTE — Patient Instructions (Signed)

## 2018-05-25 NOTE — Progress Notes (Signed)
  Subjective:  Patient ID: Rick Turner, male    DOB: 11/13/1950,  MRN: [id number]  Chief Complaint  Patient presents with  . Toe Pain    Hallux right - medial border, tender x few weeks, red and swollen, just completely antibiotic Rx'd by "[facility]"  . New Patient (Initial Visit)    Est pt - 2017    67 y.o. male presents with and ingrown toenail to the right great toe. Present for a few weeks. Finished Abx precribed by a telemed doc.   Objective:  There were no vitals filed for this visit. General AA&O x3. Normal mood and affect.  Vascular Dorsalis pedis and posterior tibial pulses  present 2+ bilaterally  Capillary refill normal to all digits. Pedal hair growth normal.  Neurologic Epicritic sensation grossly present.  Dermatologic No open lesions. Interspaces clear of maceration. Nails well groomed and normal in appearance. Painful ingrowing nail at medial nail borders of the hallux nail right.  Orthopedic: MMT 5/5 in dorsiflexion, plantarflexion, inversion, and eversion. Normal joint ROM without pain or crepitus. Pain to palpation about the ingrown nail.   Assessment & Plan:  Patient was evaluated and treated and all questions answered.  Ingrown Nail, right -Patient elects to proceed with ingrown toenail removal today -Ingrown nail excised. See procedure note. -Educated on post-procedure care including soaking. Written instructions provided. -Patient to follow up in 2 weeks for nail check.  Procedure: Excision of Ingrown Toenail Location: Right 1st toe medial nail borders. Anesthesia: Lidocaine 1% plain; 1.5 mL and Marcaine 0.5% plain; 1.5 mL, digital block. Skin Prep: Betadine. Dressing: Silvadene; telfa; dry, sterile, compression dressing. Technique: Following skin prep, the toe was exsanguinated and a tourniquet was secured at the base of the toe. The affected nail border was freed, split with a nail splitter, and excised. Chemical matrixectomy was then performed  with phenol and irrigated out with alcohol. The tourniquet was then removed and sterile dressing applied. Disposition: Patient tolerated procedure well. Patient to return in 2 weeks for follow-up.   Return for Nail Check, right foot.

## 2018-06-20 DIAGNOSIS — E7800 Pure hypercholesterolemia, unspecified: Secondary | ICD-10-CM | POA: Diagnosis not present

## 2018-07-04 ENCOUNTER — Encounter (INDEPENDENT_AMBULATORY_CARE_PROVIDER_SITE_OTHER): Payer: Self-pay | Admitting: Orthopedic Surgery

## 2018-07-04 ENCOUNTER — Ambulatory Visit (INDEPENDENT_AMBULATORY_CARE_PROVIDER_SITE_OTHER): Payer: Medicare Other | Admitting: Orthopedic Surgery

## 2018-07-04 ENCOUNTER — Telehealth (INDEPENDENT_AMBULATORY_CARE_PROVIDER_SITE_OTHER): Payer: Self-pay

## 2018-07-04 DIAGNOSIS — M1712 Unilateral primary osteoarthritis, left knee: Secondary | ICD-10-CM | POA: Diagnosis not present

## 2018-07-04 NOTE — Telephone Encounter (Signed)
Apply for gel injection for December. Last injection 05/02/2018. Dean patient.

## 2018-07-04 NOTE — Progress Notes (Signed)
Office Visit Note   Patient: Rick Turner           Date of Birth: [birth year]           MRN: [id number] Visit Date: 07/04/2018 Requested by: [name], MD [address] [place], [place] [address] PCP: [name], MD  Subjective: Chief Complaint  Patient presents with  . Left Knee - Pain    HPI: [name] is a patient with left knee pain and arthritis.  He had gel injection 05/02/2018 which did not help much.  Hurts in the play golf.  Hard for him to walk down steps.  He has known end-stage arthritis in that left knee.  Did have a cortisone shot in April.  That helped him some.              ROS: All systems reviewed are negative as they relate to the chief complaint within the history of present illness.  Patient denies  fevers or chills.   Assessment & Plan: Visit Diagnoses:  1. Unilateral primary osteoarthritis, left knee     Plan: Impression left knee arthritis.  Plan is cortisone injection into the left knee.  Continue with non-loadbearing quad strengthening exercises.  He may need knee replacement at some time in the future.  I will see him as needed.  I do want to preapproved him for gel injection in December.  We will see him back then.  Follow-Up Instructions: Return if symptoms worsen or fail to improve.   Orders:  No orders of the defined types were placed in this encounter.  No orders of the defined types were placed in this encounter.     Procedures: No procedures performed   Clinical Data: No additional findings.  Objective: Vital Signs: There were no vitals taken for this visit.  Physical Exam:   Constitutional: Patient appears well-developed HEENT:  Head: Normocephalic Eyes:EOM are normal Neck: Normal range of motion Cardiovascular: Normal rate Pulmonary/chest: Effort normal Neurologic: Patient is alert Skin: Skin is warm Psychiatric: Patient has normal mood and affect    Ortho Exam: Ortho exam demonstrates good range  of motion of the right knee.  Left knee has about a 10 degree flexion contracture with trace effusion intact extensor mechanism medial and lateral joint line tenderness.  Pedal pulses palpable.  Quad strength is excellent.  Specialty Comments:  No specialty comments available.  Imaging: No results found.   PMFS History: Patient Active Problem List   Diagnosis Date Noted  . 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 Dry eye syndrome 08/20/2013  . Nuclear sclerotic cataract 08/20/2013  . Pseudoaphakia 08/20/2013  . Detached retina 08/20/2013   Past Medical History:  Diagnosis Date  . Acne rosacea   . BPH (benign prostatic hypertrophy)   . Clavicle fracture    left  . Detached retina    left   . Fx ankle    left  . Hyperlipidemia   . Hypertension   . Hypothyroidism   . OSA (obstructive sleep apnea)    mild  . Seasonal allergies     Family History  Problem Relation Age of Onset  . CAD Mother   . CAD Father   . Hypertension Brother   . Hypertension Brother     Past Surgical History:  Procedure Laterality Date  . CATARACT EXTRACTION     left  . detached retiina     left  . EYE SURGERY     lasix  . TONSILLECTOMY  Social History   Occupational History  . Not on file  Tobacco Use  . Smoking status: Never Smoker  . Smokeless tobacco: Never Used  Substance and Sexual Activity  . Alcohol use: Not on file  . Drug use: Not on file  . Sexual activity: Not on file

## 2018-07-06 NOTE — Telephone Encounter (Signed)
Noted  

## 2018-07-13 ENCOUNTER — Telehealth (INDEPENDENT_AMBULATORY_CARE_PROVIDER_SITE_OTHER): Payer: Self-pay

## 2018-07-13 NOTE — Telephone Encounter (Signed)
Submitted VOB for Monovisc, left knee. 

## 2018-07-18 DIAGNOSIS — Z23 Encounter for immunization: Secondary | ICD-10-CM | POA: Diagnosis not present

## 2018-07-23 ENCOUNTER — Telehealth (INDEPENDENT_AMBULATORY_CARE_PROVIDER_SITE_OTHER): Payer: Self-pay

## 2018-07-23 NOTE — Telephone Encounter (Signed)
Talked with patient and advised him that he is approved for Monovisc, left knee. Buy & Bill Covered at 100% through his insurance No Co-pay No PA required  Appt.scheduled for 11/02/2018, patient may call back to R/S if he is out of town on 11/02/2018.

## 2018-07-31 DIAGNOSIS — T63481A Toxic effect of venom of other arthropod, accidental (unintentional), initial encounter: Secondary | ICD-10-CM | POA: Diagnosis not present

## 2018-08-05 ENCOUNTER — Other Ambulatory Visit (INDEPENDENT_AMBULATORY_CARE_PROVIDER_SITE_OTHER): Payer: Self-pay | Admitting: Orthopedic Surgery

## 2018-08-05 NOTE — Telephone Encounter (Signed)
Rx refill

## 2018-08-06 NOTE — Telephone Encounter (Signed)
y

## 2018-09-12 DIAGNOSIS — Z1389 Encounter for screening for other disorder: Secondary | ICD-10-CM | POA: Diagnosis not present

## 2018-09-12 DIAGNOSIS — I2510 Atherosclerotic heart disease of native coronary artery without angina pectoris: Secondary | ICD-10-CM | POA: Diagnosis not present

## 2018-09-12 DIAGNOSIS — N400 Enlarged prostate without lower urinary tract symptoms: Secondary | ICD-10-CM | POA: Diagnosis not present

## 2018-09-12 DIAGNOSIS — Z0000 Encounter for general adult medical examination without abnormal findings: Secondary | ICD-10-CM | POA: Diagnosis not present

## 2018-09-12 DIAGNOSIS — K219 Gastro-esophageal reflux disease without esophagitis: Secondary | ICD-10-CM | POA: Diagnosis not present

## 2018-09-12 DIAGNOSIS — N529 Male erectile dysfunction, unspecified: Secondary | ICD-10-CM | POA: Diagnosis not present

## 2018-09-12 DIAGNOSIS — I10 Essential (primary) hypertension: Secondary | ICD-10-CM | POA: Diagnosis not present

## 2018-09-12 DIAGNOSIS — E7800 Pure hypercholesterolemia, unspecified: Secondary | ICD-10-CM | POA: Diagnosis not present

## 2018-09-12 DIAGNOSIS — E669 Obesity, unspecified: Secondary | ICD-10-CM | POA: Diagnosis not present

## 2018-09-12 DIAGNOSIS — Z789 Other specified health status: Secondary | ICD-10-CM | POA: Diagnosis not present

## 2018-09-12 DIAGNOSIS — G4733 Obstructive sleep apnea (adult) (pediatric): Secondary | ICD-10-CM | POA: Diagnosis not present

## 2018-09-12 DIAGNOSIS — R7301 Impaired fasting glucose: Secondary | ICD-10-CM | POA: Diagnosis not present

## 2018-09-12 DIAGNOSIS — E039 Hypothyroidism, unspecified: Secondary | ICD-10-CM | POA: Diagnosis not present

## 2018-09-12 DIAGNOSIS — R972 Elevated prostate specific antigen [PSA]: Secondary | ICD-10-CM | POA: Diagnosis not present

## 2018-09-17 DIAGNOSIS — H04123 Dry eye syndrome of bilateral lacrimal glands: Secondary | ICD-10-CM | POA: Diagnosis not present

## 2018-10-17 ENCOUNTER — Other Ambulatory Visit (INDEPENDENT_AMBULATORY_CARE_PROVIDER_SITE_OTHER): Payer: Self-pay | Admitting: Orthopedic Surgery

## 2018-10-18 NOTE — Telephone Encounter (Signed)
Y thxc

## 2018-10-18 NOTE — Telephone Encounter (Signed)
Ok for refill? 

## 2018-10-19 DIAGNOSIS — Z125 Encounter for screening for malignant neoplasm of prostate: Secondary | ICD-10-CM | POA: Diagnosis not present

## 2018-10-22 DIAGNOSIS — G4733 Obstructive sleep apnea (adult) (pediatric): Secondary | ICD-10-CM | POA: Diagnosis not present

## 2018-10-22 DIAGNOSIS — Z6835 Body mass index (BMI) 35.0-35.9, adult: Secondary | ICD-10-CM | POA: Diagnosis not present

## 2018-10-22 DIAGNOSIS — E785 Hyperlipidemia, unspecified: Secondary | ICD-10-CM | POA: Diagnosis not present

## 2018-10-22 DIAGNOSIS — K4090 Unilateral inguinal hernia, without obstruction or gangrene, not specified as recurrent: Secondary | ICD-10-CM | POA: Diagnosis not present

## 2018-10-23 DIAGNOSIS — N471 Phimosis: Secondary | ICD-10-CM | POA: Diagnosis not present

## 2018-10-23 DIAGNOSIS — R972 Elevated prostate specific antigen [PSA]: Secondary | ICD-10-CM | POA: Diagnosis not present

## 2018-10-24 ENCOUNTER — Encounter (INDEPENDENT_AMBULATORY_CARE_PROVIDER_SITE_OTHER): Payer: Self-pay | Admitting: Orthopedic Surgery

## 2018-10-24 ENCOUNTER — Ambulatory Visit (INDEPENDENT_AMBULATORY_CARE_PROVIDER_SITE_OTHER): Payer: Medicare Other | Admitting: Orthopedic Surgery

## 2018-10-24 DIAGNOSIS — M1712 Unilateral primary osteoarthritis, left knee: Secondary | ICD-10-CM

## 2018-10-30 ENCOUNTER — Encounter (INDEPENDENT_AMBULATORY_CARE_PROVIDER_SITE_OTHER): Payer: Self-pay | Admitting: Orthopedic Surgery

## 2018-10-30 DIAGNOSIS — M1712 Unilateral primary osteoarthritis, left knee: Secondary | ICD-10-CM | POA: Diagnosis not present

## 2018-10-30 MED ORDER — LIDOCAINE HCL 1 % IJ SOLN
5.0000 mL | INTRAMUSCULAR | Status: AC | PRN
  Administered 2018-10-30: 5 mL

## 2018-10-30 MED ORDER — HYALURONAN 88 MG/4ML IX SOSY
88.0000 mg | PREFILLED_SYRINGE | INTRA_ARTICULAR | Status: AC | PRN
  Administered 2018-10-30: 88 mg via INTRA_ARTICULAR

## 2018-10-30 NOTE — Progress Notes (Signed)
   Procedure Note  Patient: Rick Turner             Date of Birth: 01-13-1951           MRN: [id number]             Visit Date: 10/24/2018  Procedures: Visit Diagnoses: Unilateral primary osteoarthritis, left knee  Large Joint Inj: L knee on 10/30/2018 9:00 AM Indications: pain, joint swelling and diagnostic evaluation Details: 18 G 1.5 in needle, superolateral approach  Arthrogram: No  Medications: 5 mL lidocaine 1 %; 88 mg Hyaluronan 88 MG/4ML Outcome: tolerated well, no immediate complications Procedure, treatment alternatives, risks and benefits explained, specific risks discussed. Consent was given by the patient. Immediately prior to procedure a time out was called to verify the correct patient, procedure, equipment, support staff and site/side marked as required. Patient was prepped and draped in the usual sterile fashion.    Return in 3 months for cortisone injection into the left knee

## 2018-11-02 ENCOUNTER — Ambulatory Visit (INDEPENDENT_AMBULATORY_CARE_PROVIDER_SITE_OTHER): Payer: Medicare Other | Admitting: Orthopedic Surgery

## 2018-12-07 DIAGNOSIS — G8918 Other acute postprocedural pain: Secondary | ICD-10-CM | POA: Diagnosis not present

## 2018-12-07 DIAGNOSIS — D175 Benign lipomatous neoplasm of intra-abdominal organs: Secondary | ICD-10-CM | POA: Diagnosis not present

## 2018-12-07 DIAGNOSIS — K4090 Unilateral inguinal hernia, without obstruction or gangrene, not specified as recurrent: Secondary | ICD-10-CM | POA: Diagnosis not present

## 2018-12-07 DIAGNOSIS — N471 Phimosis: Secondary | ICD-10-CM | POA: Diagnosis not present

## 2018-12-18 ENCOUNTER — Encounter (INDEPENDENT_AMBULATORY_CARE_PROVIDER_SITE_OTHER): Payer: Medicare Other | Admitting: Ophthalmology

## 2018-12-18 DIAGNOSIS — H43813 Vitreous degeneration, bilateral: Secondary | ICD-10-CM

## 2018-12-18 DIAGNOSIS — H35033 Hypertensive retinopathy, bilateral: Secondary | ICD-10-CM

## 2018-12-18 DIAGNOSIS — I10 Essential (primary) hypertension: Secondary | ICD-10-CM

## 2018-12-18 DIAGNOSIS — H35372 Puckering of macula, left eye: Secondary | ICD-10-CM

## 2018-12-18 DIAGNOSIS — H338 Other retinal detachments: Secondary | ICD-10-CM | POA: Diagnosis not present

## 2018-12-24 DIAGNOSIS — N471 Phimosis: Secondary | ICD-10-CM | POA: Diagnosis not present

## 2018-12-24 DIAGNOSIS — N3000 Acute cystitis without hematuria: Secondary | ICD-10-CM | POA: Diagnosis not present

## 2018-12-24 DIAGNOSIS — R300 Dysuria: Secondary | ICD-10-CM | POA: Diagnosis not present

## 2019-01-15 DIAGNOSIS — R948 Abnormal results of function studies of other organs and systems: Secondary | ICD-10-CM | POA: Diagnosis not present

## 2019-01-18 DIAGNOSIS — J029 Acute pharyngitis, unspecified: Secondary | ICD-10-CM | POA: Diagnosis not present

## 2019-01-18 DIAGNOSIS — J069 Acute upper respiratory infection, unspecified: Secondary | ICD-10-CM | POA: Diagnosis not present

## 2019-01-23 ENCOUNTER — Ambulatory Visit (INDEPENDENT_AMBULATORY_CARE_PROVIDER_SITE_OTHER): Payer: Medicare Other | Admitting: Orthopedic Surgery

## 2019-01-23 DIAGNOSIS — J019 Acute sinusitis, unspecified: Secondary | ICD-10-CM | POA: Diagnosis not present

## 2019-01-29 ENCOUNTER — Telehealth (INDEPENDENT_AMBULATORY_CARE_PROVIDER_SITE_OTHER): Payer: Self-pay | Admitting: *Deleted

## 2019-01-29 NOTE — Telephone Encounter (Signed)
Called pt and asked COVID-19 Pre-Screening Questions and pt answered yes to having a cough and has had it for 10 days. Will r/s pt at this time.

## 2019-01-30 ENCOUNTER — Ambulatory Visit (INDEPENDENT_AMBULATORY_CARE_PROVIDER_SITE_OTHER): Payer: Medicare Other | Admitting: Orthopedic Surgery

## 2019-02-05 DIAGNOSIS — N471 Phimosis: Secondary | ICD-10-CM | POA: Diagnosis not present

## 2019-02-05 DIAGNOSIS — N5201 Erectile dysfunction due to arterial insufficiency: Secondary | ICD-10-CM | POA: Diagnosis not present

## 2019-02-05 DIAGNOSIS — R350 Frequency of micturition: Secondary | ICD-10-CM | POA: Diagnosis not present

## 2019-02-05 DIAGNOSIS — R972 Elevated prostate specific antigen [PSA]: Secondary | ICD-10-CM | POA: Diagnosis not present

## 2019-02-07 ENCOUNTER — Telehealth (INDEPENDENT_AMBULATORY_CARE_PROVIDER_SITE_OTHER): Payer: Self-pay | Admitting: Orthopedic Surgery

## 2019-02-07 ENCOUNTER — Telehealth (INDEPENDENT_AMBULATORY_CARE_PROVIDER_SITE_OTHER): Payer: Self-pay | Admitting: Radiology

## 2019-02-07 ENCOUNTER — Telehealth (INDEPENDENT_AMBULATORY_CARE_PROVIDER_SITE_OTHER): Payer: Self-pay

## 2019-02-07 NOTE — Telephone Encounter (Signed)
Per Dr Marlou Sa called patient to see if was returning for injections No answer. LM

## 2019-02-07 NOTE — Telephone Encounter (Signed)
Called and left voicemail asking patient to call us back to answer pre screening questions for appointment on 4/3 

## 2019-02-07 NOTE — Telephone Encounter (Signed)
Patient returned your call and answered "NO" to all screening questions.  Thank you.

## 2019-02-08 ENCOUNTER — Encounter (INDEPENDENT_AMBULATORY_CARE_PROVIDER_SITE_OTHER): Payer: Self-pay | Admitting: Orthopedic Surgery

## 2019-02-08 ENCOUNTER — Other Ambulatory Visit: Payer: Self-pay

## 2019-02-08 ENCOUNTER — Ambulatory Visit (INDEPENDENT_AMBULATORY_CARE_PROVIDER_SITE_OTHER): Payer: Medicare Other | Admitting: Orthopedic Surgery

## 2019-02-08 DIAGNOSIS — M7502 Adhesive capsulitis of left shoulder: Secondary | ICD-10-CM | POA: Diagnosis not present

## 2019-02-08 DIAGNOSIS — M1712 Unilateral primary osteoarthritis, left knee: Secondary | ICD-10-CM

## 2019-02-08 MED ORDER — LIDOCAINE HCL 1 % IJ SOLN
5.0000 mL | INTRAMUSCULAR | Status: AC | PRN
  Administered 2019-02-08: 5 mL

## 2019-02-08 MED ORDER — METHYLPREDNISOLONE ACETATE 40 MG/ML IJ SUSP
40.0000 mg | INTRAMUSCULAR | Status: AC | PRN
  Administered 2019-02-08: 40 mg via INTRA_ARTICULAR

## 2019-02-08 MED ORDER — BUPIVACAINE HCL 0.25 % IJ SOLN
4.0000 mL | INTRAMUSCULAR | Status: AC | PRN
  Administered 2019-02-08: 4 mL via INTRA_ARTICULAR

## 2019-02-08 NOTE — Progress Notes (Signed)
Office Visit Note   Patient: Rick Turner           Date of Birth: May 24, 1951           MRN: [id number] Visit Date: 02/08/2019 Requested by: [name], MD [address] [place], [place] [address] PCP: [name], MD  Subjective: Chief Complaint  Patient presents with  . Right Knee - Pain    HPI: Vadhir is a patient with a left knee arthritis.  He also is having some left shoulder pain and stiffness.  No injury to either.  He has been playing golf.  He denies any mechanical symptoms in the knee.  His shoulder is been giving him some pain but no weakness.  Denies any radicular symptoms.             ROS: All systems reviewed are negative as they relate to the chief complaint within the history of present illness.  Patient denies  fevers or chills.   Assessment & Plan: Visit Diagnoses:  1. Adhesive capsulitis of left shoulder   2. Unilateral primary osteoarthritis, left knee     Plan: Impression is left knee pain with known arthritis.  Cortisone injection performed today.  I said we bring her back in about 3 months for gel injection.  In regards to the shoulder I think he has a very early frozen shoulder.  I encouraged him to use anti-inflammatories and stretch to the point and beyond of pain.  If that does not help come back in a month and we will do an intra-articular cortisone injection.  Follow-Up Instructions: Return if symptoms worsen or fail to improve.   Orders:  No orders of the defined types were placed in this encounter.  No orders of the defined types were placed in this encounter.     Procedures: Large Joint Inj: L knee on 02/08/2019 11:11 AM Indications: diagnostic evaluation, joint swelling and pain Details: 18 G 1.5 in needle, superolateral approach  Arthrogram: No  Medications: 5 mL lidocaine 1 %; 40 mg methylPREDNISolone acetate 40 MG/ML; 4 mL bupivacaine 0.25 % Outcome: tolerated well, no immediate complications Procedure, treatment  alternatives, risks and benefits explained, specific risks discussed. Consent was given by the patient. Immediately prior to procedure a time out was called to verify the correct patient, procedure, equipment, support staff and site/side marked as required. Patient was prepped and draped in the usual sterile fashion.       Clinical Data: No additional findings.  Objective: Vital Signs: There were no vitals taken for this visit.  Physical Exam:   Constitutional: Patient appears well-developed HEENT:  Head: Normocephalic Eyes:EOM are normal Neck: Normal range of motion Cardiovascular: Normal rate Pulmonary/chest: Effort normal Neurologic: Patient is alert Skin: Skin is warm Psychiatric: Patient has normal mood and affect    Ortho Exam: Ortho exam demonstrates about 5 degrees less forward flexion on the left compared to the right and about 10 degrees less external rotation on the left compared to the right but his cuff strength is good on the left-hand side.  No masses lymphadenopathy or skin changes noted in that left shoulder girdle region.  Cuff strength is excellent.  On that left knee he has trace effusion but good range of motion only slight flexion contracture.  Rest of his exam on the knee is unchanged  Specialty Comments:  No specialty comments available.  Imaging: No results found.   PMFS History: Patient Active Problem List   Diagnosis Date Noted  .  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 Dry eye syndrome 08/20/2013  . Nuclear sclerotic cataract 08/20/2013  . Pseudoaphakia 08/20/2013  . Detached retina 08/20/2013   Past Medical History:  Diagnosis Date  . Acne rosacea   . BPH (benign prostatic hypertrophy)   . Clavicle fracture    left  . Detached retina    left   . Fx ankle    left  . Hyperlipidemia   . Hypertension   . Hypothyroidism   . OSA (obstructive sleep apnea)    mild  . Seasonal allergies     Family History  Problem  Relation Age of Onset  . CAD Mother   . CAD Father   . Hypertension Brother   . Hypertension Brother     Past Surgical History:  Procedure Laterality Date  . CATARACT EXTRACTION     left  . detached retiina     left  . EYE SURGERY     lasix  . TONSILLECTOMY     Social History   Occupational History  . Not on file  Tobacco Use  . Smoking status: Never Smoker  . Smokeless tobacco: Never Used  Substance and Sexual Activity  . Alcohol use: Not on file  . Drug use: Not on file  . Sexual activity: Not on file

## 2019-02-09 ENCOUNTER — Other Ambulatory Visit: Payer: Self-pay

## 2019-02-09 ENCOUNTER — Ambulatory Visit (HOSPITAL_COMMUNITY)
Admission: EM | Admit: 2019-02-09 | Discharge: 2019-02-09 | Disposition: A | Discharge status: Other Acute Inpatient Hospital | Payer: Medicare Other | Source: Home / Self Care

## 2019-02-09 ENCOUNTER — Observation Stay (HOSPITAL_COMMUNITY)
Admission: EM | Admit: 2019-02-09 | Discharge: 2019-02-10 | Disposition: A | Discharge status: Home / Self Care | Payer: Medicare Other | Attending: Emergency Medicine | Admitting: Emergency Medicine

## 2019-02-09 ENCOUNTER — Emergency Department (HOSPITAL_COMMUNITY): Payer: Medicare Other

## 2019-02-09 ENCOUNTER — Encounter (HOSPITAL_COMMUNITY): Payer: Self-pay

## 2019-02-09 ENCOUNTER — Observation Stay (HOSPITAL_COMMUNITY): Payer: Medicare Other

## 2019-02-09 DIAGNOSIS — Z7951 Long term (current) use of inhaled steroids: Secondary | ICD-10-CM | POA: Diagnosis not present

## 2019-02-09 DIAGNOSIS — S32009A Unspecified fracture of unspecified lumbar vertebra, initial encounter for closed fracture: Secondary | ICD-10-CM | POA: Diagnosis not present

## 2019-02-09 DIAGNOSIS — S2239XA Fracture of one rib, unspecified side, initial encounter for closed fracture: Secondary | ICD-10-CM | POA: Diagnosis present

## 2019-02-09 DIAGNOSIS — G4733 Obstructive sleep apnea (adult) (pediatric): Secondary | ICD-10-CM | POA: Diagnosis not present

## 2019-02-09 DIAGNOSIS — J9811 Atelectasis: Secondary | ICD-10-CM | POA: Diagnosis not present

## 2019-02-09 DIAGNOSIS — Z79890 Hormone replacement therapy (postmenopausal): Secondary | ICD-10-CM | POA: Diagnosis not present

## 2019-02-09 DIAGNOSIS — S2241XA Multiple fractures of ribs, right side, initial encounter for closed fracture: Secondary | ICD-10-CM | POA: Diagnosis not present

## 2019-02-09 DIAGNOSIS — E039 Hypothyroidism, unspecified: Secondary | ICD-10-CM | POA: Diagnosis not present

## 2019-02-09 DIAGNOSIS — Z79899 Other long term (current) drug therapy: Secondary | ICD-10-CM | POA: Insufficient documentation

## 2019-02-09 DIAGNOSIS — S272XXA Traumatic hemopneumothorax, initial encounter: Secondary | ICD-10-CM | POA: Diagnosis not present

## 2019-02-09 DIAGNOSIS — S2249XA Multiple fractures of ribs, unspecified side, initial encounter for closed fracture: Secondary | ICD-10-CM | POA: Diagnosis not present

## 2019-02-09 DIAGNOSIS — E785 Hyperlipidemia, unspecified: Secondary | ICD-10-CM | POA: Diagnosis not present

## 2019-02-09 DIAGNOSIS — I700 Atherosclerosis of aorta: Secondary | ICD-10-CM | POA: Insufficient documentation

## 2019-02-09 DIAGNOSIS — I10 Essential (primary) hypertension: Secondary | ICD-10-CM | POA: Diagnosis not present

## 2019-02-09 DIAGNOSIS — W19XXXA Unspecified fall, initial encounter: Secondary | ICD-10-CM

## 2019-02-09 DIAGNOSIS — E871 Hypo-osmolality and hyponatremia: Secondary | ICD-10-CM | POA: Diagnosis not present

## 2019-02-09 DIAGNOSIS — E876 Hypokalemia: Secondary | ICD-10-CM | POA: Diagnosis not present

## 2019-02-09 DIAGNOSIS — M25511 Pain in right shoulder: Secondary | ICD-10-CM | POA: Diagnosis not present

## 2019-02-09 DIAGNOSIS — J939 Pneumothorax, unspecified: Secondary | ICD-10-CM

## 2019-02-09 DIAGNOSIS — N400 Enlarged prostate without lower urinary tract symptoms: Secondary | ICD-10-CM | POA: Diagnosis not present

## 2019-02-09 DIAGNOSIS — W11XXXA Fall on and from ladder, initial encounter: Secondary | ICD-10-CM | POA: Diagnosis not present

## 2019-02-09 DIAGNOSIS — S32008A Other fracture of unspecified lumbar vertebra, initial encounter for closed fracture: Secondary | ICD-10-CM | POA: Insufficient documentation

## 2019-02-09 DIAGNOSIS — Z791 Long term (current) use of non-steroidal anti-inflammatories (NSAID): Secondary | ICD-10-CM | POA: Diagnosis not present

## 2019-02-09 DIAGNOSIS — Z7982 Long term (current) use of aspirin: Secondary | ICD-10-CM | POA: Insufficient documentation

## 2019-02-09 DIAGNOSIS — J942 Hemothorax: Secondary | ICD-10-CM | POA: Diagnosis not present

## 2019-02-09 DIAGNOSIS — Z8249 Family history of ischemic heart disease and other diseases of the circulatory system: Secondary | ICD-10-CM | POA: Diagnosis not present

## 2019-02-09 HISTORY — DX: Unspecified osteoarthritis, unspecified site: M19.90

## 2019-02-09 LAB — BASIC METABOLIC PANEL
Anion gap: 12 (ref 5–15)
BUN: 17 mg/dL (ref 8–23)
CO2: 23 mmol/L (ref 22–32)
Calcium: 8.9 mg/dL (ref 8.9–10.3)
Chloride: 95 mmol/L — ABNORMAL LOW (ref 98–111)
Creatinine, Ser: 1.21 mg/dL (ref 0.61–1.24)
GFR calc Af Amer: >60 mL/min (ref >60)
GFR calc non Af Amer: >60 mL/min (ref >60)
Glucose, Bld: 138 mg/dL — ABNORMAL HIGH (ref 70–99)
Potassium: 3.2 mmol/L — ABNORMAL LOW (ref 3.5–5.1)
Sodium: 130 mmol/L — ABNORMAL LOW (ref 135–145)

## 2019-02-09 LAB — CBC WITH DIFFERENTIAL/PLATELET
Abs Immature Granulocytes: 0.02 10*3/uL (ref 0.00–0.07)
Basophils Absolute: 0 10*3/uL (ref 0.0–0.1)
Basophils Relative: 0 %
Eosinophils Absolute: 0.1 10*3/uL (ref 0.0–0.5)
Eosinophils Relative: 1 %
HCT: 32.7 % — ABNORMAL LOW (ref 39.0–52.0)
Hemoglobin: 11.3 g/dL — ABNORMAL LOW (ref 13.0–17.0)
Immature Granulocytes: 0 %
Lymphocytes Relative: 7 %
Lymphs Abs: 0.6 10*3/uL — ABNORMAL LOW (ref 0.7–4.0)
MCH: 32.8 pg (ref 26.0–34.0)
MCHC: 34.6 g/dL (ref 30.0–36.0)
MCV: 94.8 fL (ref 80.0–100.0)
Monocytes Absolute: 0.7 10*3/uL (ref 0.1–1.0)
Monocytes Relative: 8 %
Neutro Abs: 7.3 10*3/uL (ref 1.7–7.7)
Neutrophils Relative %: 84 %
Platelets: 283 10*3/uL (ref 150–400)
RBC: 3.45 MIL/uL — ABNORMAL LOW (ref 4.22–5.81)
RDW: 12.9 % (ref 11.5–15.5)
WBC: 8.7 10*3/uL (ref 4.0–10.5)
nRBC: 0 % (ref 0.0–0.2)

## 2019-02-09 LAB — URINALYSIS, ROUTINE W REFLEX MICROSCOPIC
Bilirubin Urine: NEGATIVE
Glucose, UA: NEGATIVE mg/dL
Hgb urine dipstick: NEGATIVE
Ketones, ur: NEGATIVE mg/dL
Leukocytes,Ua: NEGATIVE
Nitrite: NEGATIVE
Protein, ur: NEGATIVE mg/dL
Specific Gravity, Urine: 1.02 (ref 1.005–1.030)
pH: 6 (ref 5.0–8.0)

## 2019-02-09 MED ORDER — FLUTICASONE PROPIONATE 50 MCG/ACT NA SUSP
1.0000 | Freq: Two times a day (BID) | NASAL | Status: DC
  Filled 2019-02-09: qty 16

## 2019-02-09 MED ORDER — LORATADINE 10 MG PO TABS
10.0000 mg | ORAL_TABLET | Freq: Every day | ORAL | Status: DC
  Administered 2019-02-10: 10 mg via ORAL
  Filled 2019-02-09: qty 1

## 2019-02-09 MED ORDER — MELOXICAM 7.5 MG PO TABS
15.0000 mg | ORAL_TABLET | Freq: Every day | ORAL | Status: DC
  Filled 2019-02-09: qty 2

## 2019-02-09 MED ORDER — ONDANSETRON 4 MG PO TBDP: 4.0000 mg | ORAL_TABLET | Freq: Four times a day (QID) | ORAL | Status: DC | PRN

## 2019-02-09 MED ORDER — POTASSIUM CHLORIDE CRYS ER 10 MEQ PO TBCR
10.0000 meq | EXTENDED_RELEASE_TABLET | Freq: Two times a day (BID) | ORAL | Status: DC
  Administered 2019-02-09 – 2019-02-10 (×2): 10 meq via ORAL
  Filled 2019-02-09 (×2): qty 1

## 2019-02-09 MED ORDER — POTASSIUM CHLORIDE 20 MEQ PO PACK
40.0000 meq | PACK | Freq: Once | ORAL | Status: AC
  Administered 2019-02-09: 40 meq via ORAL
  Filled 2019-02-09: qty 2

## 2019-02-09 MED ORDER — NAPROXEN 250 MG PO TABS: 250.0000 mg | ORAL_TABLET | Freq: Two times a day (BID) | ORAL | Status: DC | PRN

## 2019-02-09 MED ORDER — MORPHINE SULFATE (PF) 2 MG/ML IV SOLN
1.0000 mg | INTRAVENOUS | Status: DC | PRN
  Administered 2019-02-09: 1 mg via INTRAVENOUS
  Filled 2019-02-09: qty 1

## 2019-02-09 MED ORDER — AMLODIPINE BESYLATE 5 MG PO TABS
5.0000 mg | ORAL_TABLET | Freq: Two times a day (BID) | ORAL | Status: DC
  Administered 2019-02-09 – 2019-02-10 (×2): 5 mg via ORAL
  Filled 2019-02-09 (×2): qty 1

## 2019-02-09 MED ORDER — BENAZEPRIL HCL 5 MG PO TABS
20.0000 mg | ORAL_TABLET | Freq: Two times a day (BID) | ORAL | Status: DC
  Administered 2019-02-09 – 2019-02-10 (×2): 20 mg via ORAL
  Filled 2019-02-09 (×2): qty 4
  Filled 2019-02-09: qty 1

## 2019-02-09 MED ORDER — ENOXAPARIN SODIUM 40 MG/0.4ML ~~LOC~~ SOLN
40.0000 mg | SUBCUTANEOUS | Status: DC
  Administered 2019-02-09 (×2): 40 mg via SUBCUTANEOUS
  Filled 2019-02-09: qty 0.4

## 2019-02-09 MED ORDER — TAMSULOSIN HCL 0.4 MG PO CAPS
0.4000 mg | ORAL_CAPSULE | Freq: Every day | ORAL | Status: DC
  Administered 2019-02-09: 0.4 mg via ORAL
  Filled 2019-02-09 (×2): qty 1

## 2019-02-09 MED ORDER — SODIUM CHLORIDE 0.9% FLUSH: 3.0000 mL | INTRAVENOUS | Status: DC | PRN

## 2019-02-09 MED ORDER — ONDANSETRON HCL 4 MG/2ML IJ SOLN: 4.0000 mg | Freq: Four times a day (QID) | INTRAMUSCULAR | Status: DC | PRN

## 2019-02-09 MED ORDER — SODIUM CHLORIDE 0.9% FLUSH
3.0000 mL | Freq: Two times a day (BID) | INTRAVENOUS | Status: DC
  Administered 2019-02-10: 3 mL via INTRAVENOUS

## 2019-02-09 MED ORDER — OXYCODONE HCL 5 MG PO TABS
10.0000 mg | ORAL_TABLET | ORAL | Status: DC | PRN
  Administered 2019-02-10 (×2): 10 mg via ORAL
  Filled 2019-02-09 (×2): qty 2

## 2019-02-09 MED ORDER — LEVOTHYROXINE SODIUM 112 MCG PO TABS
112.0000 ug | ORAL_TABLET | Freq: Every day | ORAL | Status: DC
  Administered 2019-02-09: 112 ug via ORAL
  Filled 2019-02-09: qty 1

## 2019-02-09 MED ORDER — SODIUM CHLORIDE 0.9 % IV SOLN: 250.0000 mL | INTRAVENOUS | Status: DC | PRN

## 2019-02-09 MED ORDER — ACETAMINOPHEN 500 MG PO TABS
1000.0000 mg | ORAL_TABLET | Freq: Four times a day (QID) | ORAL | Status: DC
  Administered 2019-02-09 – 2019-02-10 (×2): 1000 mg via ORAL
  Filled 2019-02-09 (×2): qty 2

## 2019-02-09 MED ORDER — OXYCODONE HCL 5 MG PO TABS: 5.0000 mg | ORAL_TABLET | ORAL | Status: DC | PRN

## 2019-02-09 MED ORDER — IOHEXOL 300 MG/ML  SOLN
100.0000 mL | Freq: Once | INTRAMUSCULAR | Status: AC | PRN
  Administered 2019-02-09: 100 mL via INTRAVENOUS

## 2019-02-09 MED ORDER — METHOCARBAMOL 500 MG PO TABS
500.0000 mg | ORAL_TABLET | Freq: Three times a day (TID) | ORAL | Status: DC
  Administered 2019-02-09 – 2019-02-10 (×2): 500 mg via ORAL
  Filled 2019-02-09 (×2): qty 1

## 2019-02-09 NOTE — ED Notes (Addendum)
ED TO INPATIENT HANDOFF REPORT  ED Nurse Name and Phone #: Sharrie Rothman [phone]  S Name/Age/Gender Rick Turner 68 y.o. male Room/Bed: 026C/026C  Code Status   Code Status: Full Code  Home/SNF/Other Home Patient oriented to: self, place, time and situation Is this baseline? Yes   Triage Complete: Triage complete  Chief Complaint Fall, Back Pain  Triage Note Pt arrives POV, was at [facility] and came to the ED. Pt was painting a room yesterday evening when he fell 6 ft off a ladder.  Pt reports right shoulder pain from impact, and mid-to-lower back pain.Pt states back pain is 6 at rest, and 9 when making any movement. Pt also reports lightheadedness that started around 1500 today.   Allergies Allergies  Allergen Reactions  . Statins Other (See Comments)    Pt reports muscle weakness when taking statins    Level of Care/Admitting Diagnosis ED Disposition    ED Disposition Condition [facility] Area: [place] [[id number]]  Level of Care: Med-Surg [16]  Diagnosis: Rib fracture [518841]  Admitting Physician: TRAUMA MD [[id number]]  Attending Physician: TRAUMA MD [[id number]]  PT Class (Do Not Modify): Observation [104]  PT Acc Code (Do Not Modify): Observation [10022]       B Medical/Surgery History Past Medical History:  Diagnosis Date  . Acne rosacea   . Arthritis   . BPH (benign prostatic hypertrophy)   . Clavicle fracture    left  . Detached retina    left   . Fx ankle    left  . Hyperlipidemia   . Hypertension   . Hypothyroidism   . OSA (obstructive sleep apnea)    mild  . Seasonal allergies    Past Surgical History:  Procedure Laterality Date  . CATARACT EXTRACTION     left  . detached retiina     left  . EYE SURGERY     lasix  . HERNIA REPAIR    . TONSILLECTOMY       A IV Location/Drains/Wounds Patient Lines/Drains/Airways Status   Active Line/Drains/Airways    Name:   Placement date:   Placement time:   Site:    Days:   Peripheral IV 02/09/19 Left Antecubital   02/09/19    1734    Antecubital   less than 1          Intake/Output Last 24 hours No intake or output data in the 24 hours ending 02/09/19 2054  Labs/Imaging Results for orders placed or performed during the hospital encounter of 02/09/19 (from the past 48 hour(s))  Basic metabolic panel     Status: Abnormal   Collection Time: 02/09/19  5:11 PM  Result Value Ref Range   Sodium 130 (L) 135 - 145 mmol/L   Potassium 3.2 (L) 3.5 - 5.1 mmol/L   Chloride 95 (L) 98 - 111 mmol/L   CO2 23 22 - 32 mmol/L   Glucose, Bld 138 (H) 70 - 99 mg/dL   BUN 17 8 - 23 mg/dL   Creatinine, Ser 1.21 0.61 - 1.24 mg/dL   Calcium 8.9 8.9 - 10.3 mg/dL   GFR calc non Af Amer >60 >60 mL/min   GFR calc Af Amer >60 >60 mL/min   Anion gap 12 5 - 15    Comment: Performed at [facility], [address] [place], [place] [address]  CBC with Differential     Status: Abnormal   Collection Time: 02/09/19  5:11 PM  Result Value Ref Range   WBC 8.7 4.0 - 10.5 K/uL   RBC 3.45 (L) 4.22 - 5.81 MIL/uL   Hemoglobin 11.3 (L) 13.0 - 17.0 g/dL   HCT 32.7 (L) 39.0 - 52.0 %   MCV 94.8 80.0 - 100.0 fL   MCH 32.8 26.0 - 34.0 pg   MCHC 34.6 30.0 - 36.0 g/dL   RDW 12.9 11.5 - 15.5 %   Platelets 283 150 - 400 K/uL   nRBC 0.0 0.0 - 0.2 %   Neutrophils Relative % 84 %   Neutro Abs 7.3 1.7 - 7.7 K/uL   Lymphocytes Relative 7 %   Lymphs Abs 0.6 (L) 0.7 - 4.0 K/uL   Monocytes Relative 8 %   Monocytes Absolute 0.7 0.1 - 1.0 K/uL   Eosinophils Relative 1 %   Eosinophils Absolute 0.1 0.0 - 0.5 K/uL   Basophils Relative 0 %   Basophils Absolute 0.0 0.0 - 0.1 K/uL   Immature Granulocytes 0 %   Abs Immature Granulocytes 0.02 0.00 - 0.07 K/uL    Comment: Performed at [facility], [address] [place], [place] [address]  Urinalysis, Routine w reflex microscopic     Status: Abnormal   Collection Time: 02/09/19  7:33 PM  Result Value Ref Range   Color, Urine STRAW  (A) YELLOW   APPearance CLEAR CLEAR   Specific Gravity, Urine 1.020 1.005 - 1.030   pH 6.0 5.0 - 8.0   Glucose, UA NEGATIVE NEGATIVE mg/dL   Hgb urine dipstick NEGATIVE NEGATIVE   Bilirubin Urine NEGATIVE NEGATIVE   Ketones, ur NEGATIVE NEGATIVE mg/dL   Protein, ur NEGATIVE NEGATIVE mg/dL   Nitrite NEGATIVE NEGATIVE   Leukocytes,Ua NEGATIVE NEGATIVE    Comment: Performed at [facility] [address], [place], [place] [address]   Ct Chest W Contrast  Result Date: 02/09/2019 CLINICAL DATA:  [place] from a ladder on the prior inter last night with pain and swelling flank. EXAM: CT CHEST, ABDOMEN, AND PELVIS WITH CONTRAST TECHNIQUE: Multidetector CT imaging of the chest, abdomen and pelvis was performed following the standard protocol during bolus administration of intravenous contrast. CONTRAST:  144mL OMNIPAQUE IOHEXOL 300 MG/ML  SOLN COMPARISON:  None. FINDINGS: CT CHEST FINDINGS Cardiovascular: Heart size is normal. There is coronary artery calcification. There is aortic atherosclerosis. No evidence of acute vascular injury. Mediastinum/Nodes: Normal Lungs/Pleura: Small pneumothorax on the right, 5% or less. Right pleural fluid which is of intermediate density and probably represents at least to some extent a small hemothorax. Dependent atelectasis in the right lower lobe. Musculoskeletal: There are mildly displaced fractures of the right tenth and eleventh ribs and a nondisplaced fracture of the right twelfth rib tip. No vertebral body fracture no sternal fracture. CT ABDOMEN PELVIS FINDINGS Hepatobiliary: No evidence of liver parenchymal injury or subcapsular hemorrhage. Gallbladder appears normal. No focal liver lesion. Pancreas: Normal Spleen: Normal Adrenals/Urinary Tract: Adrenal glands are normal. Kidneys are normal. Bladder is normal. Stomach/Bowel: Large amount of fecal matter in the right colon. No acute or traumatic bowel finding. Vascular/Lymphatic: Aortic atherosclerosis. No  aneurysm. IVC is normal. No retroperitoneal adenopathy. Reproductive: Enlarged prostate.  Otherwise negative. Other: No free fluid or air.  Previous right hernia repair. Musculoskeletal: No lumbar vertebral body fracture. Nondisplaced fracture of the right first and second transverse processes. No sacrococcygeal are other pelvic fracture. IMPRESSION: Acute fracture of the right posterior tenth and eleventh ribs, mildly displaced. Nondisplaced fracture of the right twelfth rib. Nondisplaced fractures of  the right first and second lumbar transverse processes. Small right hemo pneumothorax. Pneumothorax less than 5%. Pleural fluid layering dependently with mild dependent atelectasis in the right lower lobe. No evidence of liver parenchymal injury or subcapsular hematoma. No other significant/traumatic abdominal finding. Aortic atherosclerosis.  Enlarged prostate. Electronically Signed   By: Nelson Chimes M.D.   On: 02/09/2019 19:09   Ct Abdomen Pelvis W Contrast  Result Date: 02/09/2019 CLINICAL DATA:  [place] from a ladder on the prior inter last night with pain and swelling flank. EXAM: CT CHEST, ABDOMEN, AND PELVIS WITH CONTRAST TECHNIQUE: Multidetector CT imaging of the chest, abdomen and pelvis was performed following the standard protocol during bolus administration of intravenous contrast. CONTRAST:  110mL OMNIPAQUE IOHEXOL 300 MG/ML  SOLN COMPARISON:  None. FINDINGS: CT CHEST FINDINGS Cardiovascular: Heart size is normal. There is coronary artery calcification. There is aortic atherosclerosis. No evidence of acute vascular injury. Mediastinum/Nodes: Normal Lungs/Pleura: Small pneumothorax on the right, 5% or less. Right pleural fluid which is of intermediate density and probably represents at least to some extent a small hemothorax. Dependent atelectasis in the right lower lobe. Musculoskeletal: There are mildly displaced fractures of the right tenth and eleventh ribs and a nondisplaced fracture of the right  twelfth rib tip. No vertebral body fracture no sternal fracture. CT ABDOMEN PELVIS FINDINGS Hepatobiliary: No evidence of liver parenchymal injury or subcapsular hemorrhage. Gallbladder appears normal. No focal liver lesion. Pancreas: Normal Spleen: Normal Adrenals/Urinary Tract: Adrenal glands are normal. Kidneys are normal. Bladder is normal. Stomach/Bowel: Large amount of fecal matter in the right colon. No acute or traumatic bowel finding. Vascular/Lymphatic: Aortic atherosclerosis. No aneurysm. IVC is normal. No retroperitoneal adenopathy. Reproductive: Enlarged prostate.  Otherwise negative. Other: No free fluid or air.  Previous right hernia repair. Musculoskeletal: No lumbar vertebral body fracture. Nondisplaced fracture of the right first and second transverse processes. No sacrococcygeal are other pelvic fracture. IMPRESSION: Acute fracture of the right posterior tenth and eleventh ribs, mildly displaced. Nondisplaced fracture of the right twelfth rib. Nondisplaced fractures of the right first and second lumbar transverse processes. Small right hemo pneumothorax. Pneumothorax less than 5%. Pleural fluid layering dependently with mild dependent atelectasis in the right lower lobe. No evidence of liver parenchymal injury or subcapsular hematoma. No other significant/traumatic abdominal finding. Aortic atherosclerosis.  Enlarged prostate. Electronically Signed   By: Nelson Chimes M.D.   On: 02/09/2019 19:09    Pending Labs Unresulted Labs (From admission, onward)    Start     Ordered   02/16/19 0500  Creatinine, serum  (enoxaparin (LOVENOX)    CrCl >/= 30 ml/min)  Weekly,   R    Comments:  while on enoxaparin therapy    02/09/19 2042   02/10/19 6578  Basic metabolic panel  Tomorrow morning,   R     02/09/19 2042          Vitals/Pain Today's Vitals   02/09/19 1747 02/09/19 1939 02/09/19 1940 02/09/19 1945  BP:  (!) 175/81  (!) 163/88  Pulse:  88  84  Resp:  (!) 36  (!) 28  Temp:       TempSrc:      SpO2:  99%  99%  Weight:      Height:      PainSc: 7   7      Isolation Precautions No active isolations  Medications Medications  enoxaparin (LOVENOX) injection 40 mg (has no administration in time range)  sodium chloride flush (NS) 0.9 %  injection 3 mL (has no administration in time range)  sodium chloride flush (NS) 0.9 % injection 3 mL (has no administration in time range)  0.9 %  sodium chloride infusion (has no administration in time range)  acetaminophen (TYLENOL) tablet 1,000 mg (has no administration in time range)  oxyCODONE (Oxy IR/ROXICODONE) immediate release tablet 5 mg (has no administration in time range)  oxyCODONE (Oxy IR/ROXICODONE) immediate release tablet 10 mg (has no administration in time range)  morphine 2 MG/ML injection 1 mg (has no administration in time range)  methocarbamol (ROBAXIN) tablet 500 mg (has no administration in time range)  ondansetron (ZOFRAN-ODT) disintegrating tablet 4 mg (has no administration in time range)    Or  ondansetron (ZOFRAN) injection 4 mg (has no administration in time range)  naproxen (NAPROSYN) tablet 250 mg (has no administration in time range)  potassium chloride (KLOR-CON) packet 40 mEq (has no administration in time range)  amLODipine (NORVASC) tablet 5 mg (has no administration in time range)  benazepril (LOTENSIN) tablet 20 mg (has no administration in time range)  loratadine (CLARITIN) tablet 10 mg (has no administration in time range)  fluticasone (FLONASE) 50 MCG/ACT nasal spray 1 spray (has no administration in time range)  potassium chloride (K-DUR,KLOR-CON) CR tablet 10 mEq (has no administration in time range)  levothyroxine (SYNTHROID, LEVOTHROID) tablet 112 mcg (has no administration in time range)  meloxicam (MOBIC) tablet 15 mg (has no administration in time range)  tamsulosin (FLOMAX) capsule 0.4 mg (has no administration in time range)  iohexol (OMNIPAQUE) 300 MG/ML solution 100 mL (100  mLs Intravenous Contrast Given 02/09/19 1837)    Mobility walks Moderate fall risk   Focused Assessments Fall   R Recommendations: See Admitting Provider Note  Report given to:   Additional Notes: Pt is A&O x 4.  Had a fall from ladder yesterday.  Here for obs d/t small hemo pneumothorax

## 2019-02-09 NOTE — ED Notes (Signed)
ED Provider at bedside. 

## 2019-02-09 NOTE — ED Triage Notes (Signed)
Pt fell off ladder onto furniture last night approx 6 feet; bruising and swelling noted to right side with tenderness; pt sts stood up today and became lightheaded; pt sts took BP and SBP was 85; per KL pt to go to ED for further eval; pt agreeable

## 2019-02-09 NOTE — Progress Notes (Signed)
Patient arrived to unit on bed. Transferred from hallway and walked to bed independently. Patient requests CPAP for overnight. Oriented patient to room and [employer]

## 2019-02-09 NOTE — ED Provider Notes (Signed)
[facility] EMERGENCY DEPARTMENT Provider Note   CSN: [id number] Arrival date & time: 02/09/19  1654    History   Chief Complaint Chief Complaint  Patient presents with   Fall    HPI Rick Turner is a 68 y.o. male.  He had a mechanical fall off a ladder about 6 feet up where he struck his right posterior chest wall and right flank on a piece of furniture before hitting the ground.  There was no loss consciousness.  He said has been sore since then but today when he went from a sitting to standing position he felt extremely lightheaded and when he checked his blood pressure his systolics were 85.  He said he feels sore in general but not short of breath no abdominal pain no hematuria.  There is been no vomiting or diarrhea.  No numbness or weakness.  No head or neck pain.     The history is provided by the patient.  Fall  This is a new problem. The current episode started yesterday. The problem has not changed since onset.Associated symptoms include chest pain (posterior chest wall). Pertinent negatives include no abdominal pain, no headaches and no shortness of breath. The symptoms are aggravated by bending, twisting and standing. The symptoms are relieved by position and rest. He has tried a cold compress for the symptoms. The treatment provided mild relief.    Past Medical History:  Diagnosis Date   Acne rosacea    BPH (benign prostatic hypertrophy)    Clavicle fracture    left   Detached retina    left    Fx ankle    left   Hyperlipidemia    Hypertension    Hypothyroidism    OSA (obstructive sleep apnea)    mild   Seasonal allergies     Patient Active Problem List   Diagnosis Date Noted   Macula-off rhegmatogenous retinal detachment 04/19/2016   Dry eye syndrome 08/20/2013   Nuclear sclerotic cataract 08/20/2013   Pseudoaphakia 08/20/2013   Detached retina 08/20/2013    Past Surgical History:  Procedure Laterality Date    CATARACT EXTRACTION     left   detached retiina     left   EYE SURGERY     lasix   TONSILLECTOMY          Home Medications    Prior to Admission medications   Medication Sig Start Date End Date Taking? Authorizing Provider  amLODipine (NORVASC) 5 MG tablet Take 5 mg by mouth daily.    [provider]  aspirin EC 81 MG tablet Take by mouth.    [provider]  benazepril (LOTENSIN) 20 MG tablet Take 20 mg by mouth daily.    [provider]  cephALEXin (KEFLEX) 500 MG capsule TAKE ONE CAPSULE 3 TIMES A DAY FOR 5 DAYS 05/18/18   [provider]  chlorthalidone (HYGROTON) 25 MG tablet Take by mouth.    [provider]  cyclobenzaprine (FLEXERIL) 10 MG tablet TAKE 1 TABLET 3 TIMES A DAY AS NEEDED FOR MUSCLE SPASM 08/27/17   [provider]  diclofenac sodium (VOLTAREN) 1 % GEL APPLY TO AFFECTED AREA ONCE A DAY AS NEEDED FOR PAIN 10/18/18   Meredith Pel, MD  ezetimibe (ZETIA) 10 MG tablet Take 10 mg by mouth daily. 04/19/18   [provider]  fexofenadine (ALLEGRA) 180 MG tablet Take by mouth.    [provider]  fluticasone (FLONASE) 50 MCG/ACT nasal spray Place  into the nose.    [provider]  KLOR-CON M10 10 MEQ tablet Take 10 mEq by mouth 2 (two) times daily. 01/10/17   [provider]  levothyroxine (SYNTHROID, LEVOTHROID) 112 MCG tablet Take by mouth.    [provider]  neomycin-polymyxin-hydrocortisone (CORTISPORIN) OTIC solution Apply 2 drops to the ingrown toenail site twice daily. Cover with band-aid. 05/25/18   Evelina Bucy, DPM  omeprazole (PRILOSEC) 20 MG capsule Take by mouth daily. 03/13/18   [provider]  potassium chloride (MICRO-K) 10 MEQ CR capsule Take by mouth.    [provider]  tadalafil (CIALIS) 5 MG tablet  07/30/13   [provider]    Family History Family History  Problem Relation Age of Onset   CAD Mother    CAD  Father    Hypertension Brother    Hypertension Brother     Social History Social History   Tobacco Use   Smoking status: Never Smoker   Smokeless tobacco: Never Used  Substance Use Topics   Alcohol use: Not on file   Drug use: Not on file     Allergies   Patient has no known allergies.   Review of Systems Review of Systems  Constitutional: Negative for fever.  HENT: Negative for sore throat.   Eyes: Negative for visual disturbance.  Respiratory: Negative for shortness of breath.   Cardiovascular: Positive for chest pain (posterior chest wall).  Gastrointestinal: Negative for abdominal pain.  Genitourinary: Negative for dysuria and hematuria.  Musculoskeletal: Positive for back pain. Negative for neck pain.  Skin: Negative for rash.  Neurological: Positive for light-headedness. Negative for headaches.     Physical Exam Updated Vital Signs BP (!) 152/81 (BP Location: Right Arm)    Pulse 89    Temp 98.3 F (36.8 C) (Oral)    Resp 18    SpO2 99%   Physical Exam Vitals signs and nursing note reviewed.  Constitutional:      Appearance: He is well-developed.  HENT:     Head: Normocephalic and atraumatic.  Eyes:     Conjunctiva/sclera: Conjunctivae normal.  Neck:     Musculoskeletal: Neck supple.  Cardiovascular:     Rate and Rhythm: Normal rate and regular rhythm.     Heart sounds: No murmur.  Pulmonary:     Effort: Pulmonary effort is normal. No respiratory distress.     Breath sounds: Normal breath sounds.  Abdominal:     Palpations: Abdomen is soft.     Tenderness: There is no abdominal tenderness.  Musculoskeletal: Normal range of motion.        General: No deformity.       Arms:     Right lower leg: No edema.     Left lower leg: No edema.  Skin:    General: Skin is warm and dry.     Capillary Refill: Capillary refill takes less than 2 seconds.  Neurological:     General: No focal deficit present.     Mental Status: He is alert.     Sensory:  No sensory deficit.     Motor: No weakness.     Gait: Gait normal.      ED Treatments / Results  Labs (all labs ordered are listed, but only abnormal results are displayed) Labs Reviewed  BASIC METABOLIC PANEL - Abnormal; Notable for the following components:      Result Value   Sodium 130 (*)    Potassium 3.2 (*)  Chloride 95 (*)    Glucose, Bld 138 (*)    All other components within normal limits  CBC WITH DIFFERENTIAL/PLATELET - Abnormal; Notable for the following components:   RBC 3.45 (*)    Hemoglobin 11.3 (*)    HCT 32.7 (*)    Lymphs Abs 0.6 (*)    All other components within normal limits  URINALYSIS, ROUTINE W REFLEX MICROSCOPIC - Abnormal; Notable for the following components:   Color, Urine STRAW (*)    All other components within normal limits  BASIC METABOLIC PANEL - Abnormal; Notable for the following components:   Sodium 129 (*)    Potassium 3.2 (*)    Chloride 96 (*)    Glucose, Bld 118 (*)    Calcium 8.5 (*)    All other components within normal limits    EKG None  Radiology Ct Chest W Contrast  Result Date: 02/09/2019 CLINICAL DATA:  [place] from a ladder on the prior inter last night with pain and swelling flank. EXAM: CT CHEST, ABDOMEN, AND PELVIS WITH CONTRAST TECHNIQUE: Multidetector CT imaging of the chest, abdomen and pelvis was performed following the standard protocol during bolus administration of intravenous contrast. CONTRAST:  128mL OMNIPAQUE IOHEXOL 300 MG/ML  SOLN COMPARISON:  None. FINDINGS: CT CHEST FINDINGS Cardiovascular: Heart size is normal. There is coronary artery calcification. There is aortic atherosclerosis. No evidence of acute vascular injury. Mediastinum/Nodes: Normal Lungs/Pleura: Small pneumothorax on the right, 5% or less. Right pleural fluid which is of intermediate density and probably represents at least to some extent a small hemothorax. Dependent atelectasis in the right lower lobe. Musculoskeletal: There are mildly  displaced fractures of the right tenth and eleventh ribs and a nondisplaced fracture of the right twelfth rib tip. No vertebral body fracture no sternal fracture. CT ABDOMEN PELVIS FINDINGS Hepatobiliary: No evidence of liver parenchymal injury or subcapsular hemorrhage. Gallbladder appears normal. No focal liver lesion. Pancreas: Normal Spleen: Normal Adrenals/Urinary Tract: Adrenal glands are normal. Kidneys are normal. Bladder is normal. Stomach/Bowel: Large amount of fecal matter in the right colon. No acute or traumatic bowel finding. Vascular/Lymphatic: Aortic atherosclerosis. No aneurysm. IVC is normal. No retroperitoneal adenopathy. Reproductive: Enlarged prostate.  Otherwise negative. Other: No free fluid or air.  Previous right hernia repair. Musculoskeletal: No lumbar vertebral body fracture. Nondisplaced fracture of the right first and second transverse processes. No sacrococcygeal are other pelvic fracture. IMPRESSION: Acute fracture of the right posterior tenth and eleventh ribs, mildly displaced. Nondisplaced fracture of the right twelfth rib. Nondisplaced fractures of the right first and second lumbar transverse processes. Small right hemo pneumothorax. Pneumothorax less than 5%. Pleural fluid layering dependently with mild dependent atelectasis in the right lower lobe. No evidence of liver parenchymal injury or subcapsular hematoma. No other significant/traumatic abdominal finding. Aortic atherosclerosis.  Enlarged prostate. Electronically Signed   By: Nelson Chimes M.D.   On: 02/09/2019 19:09   Ct Abdomen Pelvis W Contrast  Result Date: 02/09/2019 CLINICAL DATA:  [place] from a ladder on the prior inter last night with pain and swelling flank. EXAM: CT CHEST, ABDOMEN, AND PELVIS WITH CONTRAST TECHNIQUE: Multidetector CT imaging of the chest, abdomen and pelvis was performed following the standard protocol during bolus administration of intravenous contrast. CONTRAST:  113mL OMNIPAQUE IOHEXOL 300  MG/ML  SOLN COMPARISON:  None. FINDINGS: CT CHEST FINDINGS Cardiovascular: Heart size is normal. There is coronary artery calcification. There is aortic atherosclerosis. No evidence of acute vascular injury. Mediastinum/Nodes: Normal Lungs/Pleura: Small pneumothorax on the right,  5% or less. Right pleural fluid which is of intermediate density and probably represents at least to some extent a small hemothorax. Dependent atelectasis in the right lower lobe. Musculoskeletal: There are mildly displaced fractures of the right tenth and eleventh ribs and a nondisplaced fracture of the right twelfth rib tip. No vertebral body fracture no sternal fracture. CT ABDOMEN PELVIS FINDINGS Hepatobiliary: No evidence of liver parenchymal injury or subcapsular hemorrhage. Gallbladder appears normal. No focal liver lesion. Pancreas: Normal Spleen: Normal Adrenals/Urinary Tract: Adrenal glands are normal. Kidneys are normal. Bladder is normal. Stomach/Bowel: Large amount of fecal matter in the right colon. No acute or traumatic bowel finding. Vascular/Lymphatic: Aortic atherosclerosis. No aneurysm. IVC is normal. No retroperitoneal adenopathy. Reproductive: Enlarged prostate.  Otherwise negative. Other: No free fluid or air.  Previous right hernia repair. Musculoskeletal: No lumbar vertebral body fracture. Nondisplaced fracture of the right first and second transverse processes. No sacrococcygeal are other pelvic fracture. IMPRESSION: Acute fracture of the right posterior tenth and eleventh ribs, mildly displaced. Nondisplaced fracture of the right twelfth rib. Nondisplaced fractures of the right first and second lumbar transverse processes. Small right hemo pneumothorax. Pneumothorax less than 5%. Pleural fluid layering dependently with mild dependent atelectasis in the right lower lobe. No evidence of liver parenchymal injury or subcapsular hematoma. No other significant/traumatic abdominal finding. Aortic atherosclerosis.   Enlarged prostate. Electronically Signed   By: Nelson Chimes M.D.   On: 02/09/2019 19:09   Dg Chest Port 1 View  Result Date: 02/10/2019 CLINICAL DATA:  Follow-up pneumothorax EXAM: PORTABLE CHEST 1 VIEW COMPARISON:  Chest x-ray from yesterday FINDINGS: Trace right apical pneumothorax. This is known from prior chest CT. Atelectasis at the bases. Normal heart size. Atherosclerosis. IMPRESSION: Trace right apical pneumothorax. Low volume chest with atelectasis. Electronically Signed   By: Monte Fantasia M.D.   On: 02/10/2019 07:07   Dg Shoulder Right Port  Result Date: 02/09/2019 CLINICAL DATA:  Fall from ladder onto furniture last night 6 feet with right shoulder pain. EXAM: PORTABLE RIGHT SHOULDER COMPARISON:  None. FINDINGS: No evidence of acute fracture or dislocation. Degenerative change of the glenohumeral joint. IMPRESSION: No acute findings. Electronically Signed   By: Marin Olp M.D.   On: 02/09/2019 21:11    Procedures Procedures (including critical care time)  Medications Ordered in ED Medications - No data to display   Initial Impression / Assessment and Plan / ED Course  I have reviewed the triage vital signs and the nursing notes.  Pertinent labs & imaging results that were available during my care of the patient were reviewed by me and considered in my medical decision making (see chart for details).  Clinical Course as of Feb 09 1037  Sat Feb 09, 2019  1932 Differential diagnosis includes rib fractures, intra-abdominal bleed, retroperitoneal bleed.    [MB]  2040 Patient has multiple rib fractures transverse process fractures and a small hemopneumothorax on the right.  I contacted trauma attending on call Dr. Donne Hazel who will evaluate the patient in the ED for recommendations.  Later spoke to the patient and he said that Dr. Donne Hazel was admitting him to the hospital overnight to make sure that that hemopneumothorax is stable.   [MB]    Clinical Course User Index [MB]  Hayden Rasmussen, MD         Final Clinical Impressions(s) / ED Diagnoses   Final diagnoses:  Hemothorax with pneumothorax, traumatic, initial encounter  Closed fracture of multiple ribs of right side, initial encounter  Lumbar transverse process fracture, closed, initial encounter [facility])  Fall, initial encounter    ED Discharge Orders    None       Hayden Rasmussen, MD 02/10/19 1039

## 2019-02-09 NOTE — H&P (Signed)
Rick Turner is an 68 y.o. male.   Chief Complaint: right chest pain HPI: 17 yom with htn, bph on 6 foot ladder in his home last night painting and fell . He hit his right flank on a table on way down.  No loc. Remembers everything. Sore.  He felt lightheaded today when standing. Took his bp and systolic was in 98P. Presented to urgent care and transferred to er. No neck pain, no loc, no abd pain, no sob, no cough, no fever.  Voiding fine.  He underwent ct scan that shows right rib fx and small associated hptx.  Past Medical History:  Diagnosis Date  . Acne rosacea   . Arthritis   . BPH (benign prostatic hypertrophy)   . Clavicle fracture    left  . Detached retina    left   . Fx ankle    left  . Hyperlipidemia   . Hypertension   . Hypothyroidism   . OSA (obstructive sleep apnea)    mild  . Seasonal allergies     Past Surgical History:  Procedure Laterality Date  . CATARACT EXTRACTION     left  . detached retiina     left  . EYE SURGERY     lasix  . HERNIA REPAIR    . TONSILLECTOMY      Family History  Problem Relation Age of Onset  . CAD Mother   . CAD Father   . Hypertension Brother   . Hypertension Brother    Social History:  reports that he has never smoked. He has never used smokeless tobacco. He reports current alcohol use of about 6.0 - 10.0 standard drinks of alcohol per week. He reports that he does not use drugs.  Allergies:  Allergies  Allergen Reactions  . Statins Other (See Comments)    Pt reports muscle weakness when taking statins    meds reviewed   Results for orders placed or performed during the hospital encounter of 02/09/19 (from the past 48 hour(s))  Basic metabolic panel     Status: Abnormal   Collection Time: 02/09/19  5:11 PM  Result Value Ref Range   Sodium 130 (L) 135 - 145 mmol/L   Potassium 3.2 (L) 3.5 - 5.1 mmol/L   Chloride 95 (L) 98 - 111 mmol/L   CO2 23 22 - 32 mmol/L   Glucose, Bld 138 (H) 70 - 99 mg/dL   BUN 17 8 -  23 mg/dL   Creatinine, Ser 1.21 0.61 - 1.24 mg/dL   Calcium 8.9 8.9 - 10.3 mg/dL   GFR calc non Af Amer >60 >60 mL/min   GFR calc Af Amer >60 >60 mL/min   Anion gap 12 5 - 15    Comment: Performed at [facility], [place] [address], [place], [place] [address]  CBC with Differential     Status: Abnormal   Collection Time: 02/09/19  5:11 PM  Result Value Ref Range   WBC 8.7 4.0 - 10.5 K/uL   RBC 3.45 (L) 4.22 - 5.81 MIL/uL   Hemoglobin 11.3 (L) 13.0 - 17.0 g/dL   HCT 32.7 (L) 39.0 - 52.0 %   MCV 94.8 80.0 - 100.0 fL   MCH 32.8 26.0 - 34.0 pg   MCHC 34.6 30.0 - 36.0 g/dL   RDW 12.9 11.5 - 15.5 %   Platelets 283 150 - 400 K/uL   nRBC 0.0 0.0 - 0.2 %   Neutrophils Relative % 84 %   Neutro  Abs 7.3 1.7 - 7.7 K/uL   Lymphocytes Relative 7 %   Lymphs Abs 0.6 (L) 0.7 - 4.0 K/uL   Monocytes Relative 8 %   Monocytes Absolute 0.7 0.1 - 1.0 K/uL   Eosinophils Relative 1 %   Eosinophils Absolute 0.1 0.0 - 0.5 K/uL   Basophils Relative 0 %   Basophils Absolute 0.0 0.0 - 0.1 K/uL   Immature Granulocytes 0 %   Abs Immature Granulocytes 0.02 0.00 - 0.07 K/uL    Comment: Performed at [facility] [address] [place], [place] [address]   Ct Chest W Contrast  Result Date: 02/09/2019 CLINICAL DATA:  [place] from a ladder on the prior inter last night with pain and swelling flank. EXAM: CT CHEST, ABDOMEN, AND PELVIS WITH CONTRAST TECHNIQUE: Multidetector CT imaging of the chest, abdomen and pelvis was performed following the standard protocol during bolus administration of intravenous contrast. CONTRAST:  175mL OMNIPAQUE IOHEXOL 300 MG/ML  SOLN COMPARISON:  None. FINDINGS: CT CHEST FINDINGS Cardiovascular: Heart size is normal. There is coronary artery calcification. There is aortic atherosclerosis. No evidence of acute vascular injury. Mediastinum/Nodes: Normal Lungs/Pleura: Small pneumothorax on the right, 5% or less. Right pleural fluid which is of intermediate density and probably  represents at least to some extent a small hemothorax. Dependent atelectasis in the right lower lobe. Musculoskeletal: There are mildly displaced fractures of the right tenth and eleventh ribs and a nondisplaced fracture of the right twelfth rib tip. No vertebral body fracture no sternal fracture. CT ABDOMEN PELVIS FINDINGS Hepatobiliary: No evidence of liver parenchymal injury or subcapsular hemorrhage. Gallbladder appears normal. No focal liver lesion. Pancreas: Normal Spleen: Normal Adrenals/Urinary Tract: Adrenal glands are normal. Kidneys are normal. Bladder is normal. Stomach/Bowel: Large amount of fecal matter in the right colon. No acute or traumatic bowel finding. Vascular/Lymphatic: Aortic atherosclerosis. No aneurysm. IVC is normal. No retroperitoneal adenopathy. Reproductive: Enlarged prostate.  Otherwise negative. Other: No free fluid or air.  Previous right hernia repair. Musculoskeletal: No lumbar vertebral body fracture. Nondisplaced fracture of the right first and second transverse processes. No sacrococcygeal are other pelvic fracture. IMPRESSION: Acute fracture of the right posterior tenth and eleventh ribs, mildly displaced. Nondisplaced fracture of the right twelfth rib. Nondisplaced fractures of the right first and second lumbar transverse processes. Small right hemo pneumothorax. Pneumothorax less than 5%. Pleural fluid layering dependently with mild dependent atelectasis in the right lower lobe. No evidence of liver parenchymal injury or subcapsular hematoma. No other significant/traumatic abdominal finding. Aortic atherosclerosis.  Enlarged prostate. Electronically Signed   By: Nelson Chimes M.D.   On: 02/09/2019 19:09   Ct Abdomen Pelvis W Contrast  Result Date: 02/09/2019 CLINICAL DATA:  [place] from a ladder on the prior inter last night with pain and swelling flank. EXAM: CT CHEST, ABDOMEN, AND PELVIS WITH CONTRAST TECHNIQUE: Multidetector CT imaging of the chest, abdomen and pelvis was  performed following the standard protocol during bolus administration of intravenous contrast. CONTRAST:  176mL OMNIPAQUE IOHEXOL 300 MG/ML  SOLN COMPARISON:  None. FINDINGS: CT CHEST FINDINGS Cardiovascular: Heart size is normal. There is coronary artery calcification. There is aortic atherosclerosis. No evidence of acute vascular injury. Mediastinum/Nodes: Normal Lungs/Pleura: Small pneumothorax on the right, 5% or less. Right pleural fluid which is of intermediate density and probably represents at least to some extent a small hemothorax. Dependent atelectasis in the right lower lobe. Musculoskeletal: There are mildly displaced fractures of the right tenth and eleventh ribs and a nondisplaced fracture  of the right twelfth rib tip. No vertebral body fracture no sternal fracture. CT ABDOMEN PELVIS FINDINGS Hepatobiliary: No evidence of liver parenchymal injury or subcapsular hemorrhage. Gallbladder appears normal. No focal liver lesion. Pancreas: Normal Spleen: Normal Adrenals/Urinary Tract: Adrenal glands are normal. Kidneys are normal. Bladder is normal. Stomach/Bowel: Large amount of fecal matter in the right colon. No acute or traumatic bowel finding. Vascular/Lymphatic: Aortic atherosclerosis. No aneurysm. IVC is normal. No retroperitoneal adenopathy. Reproductive: Enlarged prostate.  Otherwise negative. Other: No free fluid or air.  Previous right hernia repair. Musculoskeletal: No lumbar vertebral body fracture. Nondisplaced fracture of the right first and second transverse processes. No sacrococcygeal are other pelvic fracture. IMPRESSION: Acute fracture of the right posterior tenth and eleventh ribs, mildly displaced. Nondisplaced fracture of the right twelfth rib. Nondisplaced fractures of the right first and second lumbar transverse processes. Small right hemo pneumothorax. Pneumothorax less than 5%. Pleural fluid layering dependently with mild dependent atelectasis in the right lower lobe. No evidence  of liver parenchymal injury or subcapsular hematoma. No other significant/traumatic abdominal finding. Aortic atherosclerosis.  Enlarged prostate. Electronically Signed   By: Nelson Chimes M.D.   On: 02/09/2019 19:09    Review of Systems  Cardiovascular: Positive for chest pain (right posterior chest).  Musculoskeletal: Positive for joint pain (right shoulder).    Blood pressure (!) 163/88, pulse 84, temperature 98.3 F (36.8 C), temperature source Oral, resp. rate (!) 28, height 5\' 11"  (1.803 m), weight 108.9 kg, SpO2 99 %. Physical Exam  Constitutional: He is oriented to person, place, and time. He appears well-developed and well-nourished.  HENT:  Head: Normocephalic and atraumatic.  Right Ear: External ear normal.  Left Ear: External ear normal.  Mouth/Throat: Oropharynx is clear and moist.  Eyes: Pupils are equal, round, and reactive to light. EOM are normal. No scleral icterus.  Neck: Neck supple. No thyromegaly present.  Cardiovascular: Normal rate, regular rhythm, normal heart sounds and intact distal pulses.  Respiratory: Effort normal. He has no wheezes. Rales: right posterior chest tender with ecchymosis. He exhibits tenderness.  Decreased breath sounds right base   GI: Soft. Bowel sounds are normal. He exhibits no distension.  Musculoskeletal: Normal range of motion.        General: Tenderness (right anterior shoulder) present.  Lymphadenopathy:    He has no cervical adenopathy.  Neurological: He is alert and oriented to person, place, and time. He has normal strength. No sensory deficit.  Skin: Skin is warm and dry.  Psychiatric: He has a normal mood and affect. His behavior is normal.     Assessment/Plan Fall Right 10,11, 12 rib fractures- pain control, pulm toilet, check right shoulder film Right HTPX- doesn't need chest tube, breathing comfortably,sats nl, will do cxr in am L1 and L2 TP fx- pain control, have PT see in am HTN-restart home meds Hypokalemia-  replace tonight then restart normal K dose, recheck in am Hyponatremia- recheck in am Lovenox, scds  Rolm Bookbinder, MD 02/09/2019, 8:06 PM

## 2019-02-09 NOTE — ED Triage Notes (Signed)
Pt arrives POV, was at [facility] and came to the ED. Pt was painting a room yesterday evening when he fell 6 ft off a ladder.  Pt reports right shoulder pain from impact, and mid-to-lower back pain.Pt states back pain is 6 at rest, and 9 when making any movement. Pt also reports lightheadedness that started around 1500 today.

## 2019-02-09 NOTE — ED Notes (Signed)
Trauma MD at bedside.

## 2019-02-09 NOTE — Progress Notes (Signed)
Setup patient on CPAP and tolerating well. Auto mode with a ramp was patients home settings as far as he knew. No issues at this time.

## 2019-02-10 ENCOUNTER — Observation Stay (HOSPITAL_COMMUNITY): Payer: Medicare Other

## 2019-02-10 DIAGNOSIS — J939 Pneumothorax, unspecified: Secondary | ICD-10-CM | POA: Diagnosis not present

## 2019-02-10 DIAGNOSIS — I10 Essential (primary) hypertension: Secondary | ICD-10-CM | POA: Diagnosis not present

## 2019-02-10 DIAGNOSIS — E876 Hypokalemia: Secondary | ICD-10-CM | POA: Diagnosis not present

## 2019-02-10 DIAGNOSIS — S272XXA Traumatic hemopneumothorax, initial encounter: Secondary | ICD-10-CM | POA: Diagnosis not present

## 2019-02-10 DIAGNOSIS — S2241XA Multiple fractures of ribs, right side, initial encounter for closed fracture: Secondary | ICD-10-CM | POA: Diagnosis not present

## 2019-02-10 DIAGNOSIS — E871 Hypo-osmolality and hyponatremia: Secondary | ICD-10-CM | POA: Diagnosis not present

## 2019-02-10 LAB — BASIC METABOLIC PANEL
Anion gap: 9 (ref 5–15)
BUN: 14 mg/dL (ref 8–23)
CO2: 24 mmol/L (ref 22–32)
Calcium: 8.5 mg/dL — ABNORMAL LOW (ref 8.9–10.3)
Chloride: 96 mmol/L — ABNORMAL LOW (ref 98–111)
Creatinine, Ser: 1.17 mg/dL (ref 0.61–1.24)
GFR calc Af Amer: >60 mL/min (ref >60)
GFR calc non Af Amer: >60 mL/min (ref >60)
Glucose, Bld: 118 mg/dL — ABNORMAL HIGH (ref 70–99)
Potassium: 3.2 mmol/L — ABNORMAL LOW (ref 3.5–5.1)
Sodium: 129 mmol/L — ABNORMAL LOW (ref 135–145)

## 2019-02-10 MED ORDER — NAPROXEN 250 MG PO TABS: 250.0000 mg | ORAL_TABLET | Freq: Two times a day (BID) | ORAL | Status: DC | PRN

## 2019-02-10 MED ORDER — OXYCODONE HCL 5 MG PO TABS: 5.0000 mg | ORAL_TABLET | ORAL | 0 refills | Status: DC | PRN

## 2019-02-10 MED ORDER — ACETAMINOPHEN 500 MG PO TABS: 1000.0000 mg | ORAL_TABLET | Freq: Four times a day (QID) | ORAL | 0 refills | Status: AC

## 2019-02-10 NOTE — Discharge Summary (Signed)
Physician Discharge Summary  Patient ID: Rick Turner MRN: [id number] DOB/AGE: 1951/05/23 68 y.o.  Admit date: 02/09/2019 Discharge date: 02/10/2019  Admission Diagnoses:S/P fall, R rib FX 10-12  Discharge Diagnoses: S/P fall, R rib FX 10-12 Active Problems:   Rib fracture   Discharged Condition: good  Hospital Course: Patient was admitted the day following a fall. He had R rib FX 10-12 and L1-2 TVP FXs. F/U CXR showed trace PTX. He did well with pain control and pulmonary toilet and is ready for D/C.   Consults: None  Significant Diagnostic Studies: CT and CXR  Treatments: analgesia: Morphine  Discharge Exam: Blood pressure 124/70, pulse (!) 58, temperature 97.9 F (36.6 C), temperature source Oral, resp. rate 16, height 5\' 11"  (1.803 m), weight 108.9 kg, SpO2 97 %. General appearance: alert and cooperative Resp: clear to auscultation bilaterally Cardio: regular rate and rhythm GI: soft, NT Neurologic: Mental status: Alert, oriented, thought content appropriate  Disposition: Discharge disposition: 01-Home or Self Care       Discharge Instructions    Call MD for:  severe uncontrolled pain   Complete by:  As directed    Diet - low sodium heart healthy   Complete by:  As directed    Discharge instructions   Complete by:  As directed    Use incentive spirometer   Increase activity slowly   Complete by:  As directed    Lifting restrictions   Complete by:  As directed    Increase lifting slowly as able   No wound care   Complete by:  As directed      Allergies as of 02/10/2019      Reactions   Statins Other (See Comments)   Pt reports muscle weakness when taking statins      Medication List    STOP taking these medications   HYDROcodone-acetaminophen 5-325 MG tablet Commonly known as:  NORCO/VICODIN     TAKE these medications   acetaminophen 500 MG tablet Commonly known as:  TYLENOL Take 2 tablets (1,000 mg total) by mouth every 6 (six) hours.    amLODipine 5 MG tablet Commonly known as:  NORVASC Take 5 mg by mouth 2 (two) times daily.   aspirin EC 81 MG tablet Take 81 mg by mouth daily.   benazepril 20 MG tablet Commonly known as:  LOTENSIN Take 20 mg by mouth 2 (two) times daily.   benzonatate 200 MG capsule Commonly known as:  TESSALON Take 200 mg by mouth 3 (three) times daily as needed for cough.   chlorthalidone 25 MG tablet Commonly known as:  HYGROTON Take 25 mg by mouth daily.   Cialis 5 MG tablet Generic drug:  tadalafil Take 5 mg by mouth daily as needed (AS DIRECTED).   cyclobenzaprine 10 MG tablet Commonly known as:  FLEXERIL Take 10 mg by mouth 3 (three) times daily as needed for muscle spasms.   diclofenac sodium 1 % Gel Commonly known as:  VOLTAREN APPLY TO AFFECTED AREA ONCE A DAY AS NEEDED FOR PAIN What changed:  See the new instructions.   fexofenadine 180 MG tablet Commonly known as:  ALLEGRA Take 180 mg by mouth daily.   fluticasone 50 MCG/ACT nasal spray Commonly known as:  FLONASE Place 1 spray into both nostrils 2 (two) times daily.   Klor-Con M10 10 MEQ tablet Generic drug:  potassium chloride Take 10 mEq by mouth 2 (two) times daily.   levothyroxine 112 MCG tablet Commonly known as:  SYNTHROID, LEVOTHROID Take  112 mcg by mouth at bedtime.   meloxicam 15 MG tablet Commonly known as:  MOBIC Take 15 mg by mouth daily.   naproxen 250 MG tablet Commonly known as:  NAPROSYN Take 1 tablet (250 mg total) by mouth 2 (two) times daily as needed for moderate pain.   oxyCODONE 5 MG immediate release tablet Commonly known as:  Oxy IR/ROXICODONE Take 1 tablet (5 mg total) by mouth every 4 (four) hours as needed for severe pain.   Repatha SureClick 102 MG/ML Soaj Generic drug:  Evolocumab Inject 140 mg into the skin every 14 (fourteen) days.   tamsulosin 0.4 MG Caps capsule Commonly known as:  FLOMAX Take 0.4 mg by mouth daily.      Follow-up Information    [name], MD  Follow up in 2 week(s).   Specialty:  Internal Medicine Contact information: [address] [place] [address] [phone]           Signed: Zenovia Jarred 02/10/2019, 9:38 AM

## 2019-02-10 NOTE — Progress Notes (Signed)
[name] to be D/C'd  per MD order. Discussed with the patient and all questions fully answered.  VSS, Skin clean, dry and intact without evidence of skin break down, no evidence of skin tears noted.  IV catheter discontinued intact. Site without signs and symptoms of complications. Dressing and pressure applied.  An After Visit Summary was printed and given to the patient. Patient received prescription.  D/c education completed with patient/family including follow up instructions, medication list, d/c activities limitations if indicated, with other d/c instructions as indicated by MD - patient able to verbalize understanding, all questions fully answered.   Patient instructed to return to ED, call 911, or call MD for any changes in condition.   Patient to be escorted via [place], and D/C home via private auto.

## 2019-02-23 ENCOUNTER — Other Ambulatory Visit (INDEPENDENT_AMBULATORY_CARE_PROVIDER_SITE_OTHER): Payer: Self-pay | Admitting: Orthopedic Surgery

## 2019-02-25 DIAGNOSIS — S2241XA Multiple fractures of ribs, right side, initial encounter for closed fracture: Secondary | ICD-10-CM | POA: Diagnosis not present

## 2019-02-25 DIAGNOSIS — S32009A Unspecified fracture of unspecified lumbar vertebra, initial encounter for closed fracture: Secondary | ICD-10-CM | POA: Diagnosis not present

## 2019-03-06 ENCOUNTER — Encounter (INDEPENDENT_AMBULATORY_CARE_PROVIDER_SITE_OTHER): Payer: Self-pay | Admitting: Orthopedic Surgery

## 2019-03-06 ENCOUNTER — Other Ambulatory Visit: Payer: Self-pay

## 2019-03-06 ENCOUNTER — Ambulatory Visit (INDEPENDENT_AMBULATORY_CARE_PROVIDER_SITE_OTHER): Payer: Medicare Other | Admitting: Orthopedic Surgery

## 2019-03-06 DIAGNOSIS — M7502 Adhesive capsulitis of left shoulder: Secondary | ICD-10-CM | POA: Diagnosis not present

## 2019-03-06 MED ORDER — BUPIVACAINE HCL 0.5 % IJ SOLN
9.0000 mL | INTRAMUSCULAR | Status: AC | PRN
  Administered 2019-03-06: 09:00:00 9 mL via INTRA_ARTICULAR

## 2019-03-06 MED ORDER — LIDOCAINE HCL 1 % IJ SOLN
5.0000 mL | INTRAMUSCULAR | Status: AC | PRN
  Administered 2019-03-06: 5 mL

## 2019-03-06 MED ORDER — METHYLPREDNISOLONE ACETATE 40 MG/ML IJ SUSP
40.0000 mg | INTRAMUSCULAR | Status: AC | PRN
  Administered 2019-03-06: 09:00:00 40 mg via INTRA_ARTICULAR

## 2019-03-06 NOTE — Progress Notes (Signed)
Office Visit Note   Patient: Rick Turner           Date of Birth: November 03, 1951           MRN: [id number] Visit Date: 03/06/2019 Requested by: [name], MD [address] [place], [place] [address] PCP: [name], MD  Subjective: Chief Complaint  Patient presents with  . Left Shoulder - Pain    HPI: Mozell is a patient with left shoulder pain.  Since have seen him a month ago he is tried doing stretching exercises but his symptoms have continued.  He describes less range of motion now than he had before.  Since have seen him he is actually had a fall with pneumothorax on the right-hand side.  That resolved.  His knee is doing reasonably well.              ROS: All systems reviewed are negative as they relate to the chief complaint within the history of present illness.  Patient denies  fevers or chills.   Assessment & Plan: Visit Diagnoses: No diagnosis found.  Plan: Impression is left frozen shoulder with some progressive restriction of external rotation of 15 degrees of abduction.  Plan at this time is intra-articular injection plus continued range of motion and strengthening exercises.  I think we really want to focus on the range of motion exercises.  Come back in 6 weeks for consideration of second injection if symptoms are persisting.  Follow-Up Instructions: No follow-ups on file.   Orders:  No orders of the defined types were placed in this encounter.  No orders of the defined types were placed in this encounter.     Procedures: Large Joint Inj: L glenohumeral on 03/06/2019 8:55 AM Indications: diagnostic evaluation and pain Details: 18 G 1.5 in needle, posterior approach  Arthrogram: No  Medications: 9 mL bupivacaine 0.5 %; 40 mg methylPREDNISolone acetate 40 MG/ML; 5 mL lidocaine 1 % Outcome: tolerated well, no immediate complications Procedure, treatment alternatives, risks and benefits explained, specific risks discussed. Consent was  given by the patient. Immediately prior to procedure a time out was called to verify the correct patient, procedure, equipment, support staff and site/side marked as required. Patient was prepped and draped in the usual sterile fashion.       Clinical Data: No additional findings.  Objective: Vital Signs: There were no vitals taken for this visit.  Physical Exam:   Constitutional: Patient appears well-developed HEENT:  Head: Normocephalic Eyes:EOM are normal Neck: Normal range of motion Cardiovascular: Normal rate Pulmonary/chest: Effort normal Neurologic: Patient is alert Skin: Skin is warm Psychiatric: Patient has normal mood and affect    Ortho Exam: Ortho exam demonstrates good cervical spine range of motion.  Patient has good grip EPL FPL interosseous wrist flexion wrist extension bicep triceps and deltoid strength.  Radial pulses intact bilaterally.  External rotation on the right is about 40 degrees on the left is about 20.  Isolated glenohumeral abduction is over 90 bilaterally but forward flexion is about 20 degrees less on the left compared to the right.  Specialty Comments:  No specialty comments available.  Imaging: No results found.   PMFS History: Patient Active Problem List   Diagnosis Date Noted  . Rib fracture 02/09/2019  . 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 Dry eye syndrome 08/20/2013  . Nuclear sclerotic cataract 08/20/2013  . Pseudoaphakia 08/20/2013  . Detached retina 08/20/2013   Past Medical History:  Diagnosis Date  .  Acne rosacea   . Arthritis   . BPH (benign prostatic hypertrophy)   . Clavicle fracture    left  . Detached retina    left   . Fx ankle    left  . Hyperlipidemia   . Hypertension   . Hypothyroidism   . OSA (obstructive sleep apnea)    mild  . Seasonal allergies     Family History  Problem Relation Age of Onset  . CAD Mother   . CAD Father   . Hypertension Brother   . Hypertension Brother      Past Surgical History:  Procedure Laterality Date  . CATARACT EXTRACTION     left  . detached retiina     left  . EYE SURGERY     lasix  . HERNIA REPAIR    . TONSILLECTOMY     Social History   Occupational History  . Not on file  Tobacco Use  . Smoking status: Never Smoker  . Smokeless tobacco: Never Used  Substance and Sexual Activity  . Alcohol use: Yes    Alcohol/week: 6.0 - 10.0 standard drinks    Types: 6 - 10 Standard drinks or equivalent per week  . Drug use: Never  . Sexual activity: Not on file

## 2019-03-23 ENCOUNTER — Other Ambulatory Visit (INDEPENDENT_AMBULATORY_CARE_PROVIDER_SITE_OTHER): Payer: Self-pay | Admitting: Orthopedic Surgery

## 2019-03-25 NOTE — Telephone Encounter (Signed)
Ok to rf? 

## 2019-03-25 NOTE — Telephone Encounter (Signed)
y

## 2019-03-27 DIAGNOSIS — E669 Obesity, unspecified: Secondary | ICD-10-CM | POA: Diagnosis not present

## 2019-03-27 DIAGNOSIS — G4733 Obstructive sleep apnea (adult) (pediatric): Secondary | ICD-10-CM | POA: Diagnosis not present

## 2019-03-27 DIAGNOSIS — N400 Enlarged prostate without lower urinary tract symptoms: Secondary | ICD-10-CM | POA: Diagnosis not present

## 2019-03-27 DIAGNOSIS — R29898 Other symptoms and signs involving the musculoskeletal system: Secondary | ICD-10-CM | POA: Diagnosis not present

## 2019-03-27 DIAGNOSIS — I10 Essential (primary) hypertension: Secondary | ICD-10-CM | POA: Diagnosis not present

## 2019-03-27 DIAGNOSIS — E7800 Pure hypercholesterolemia, unspecified: Secondary | ICD-10-CM | POA: Diagnosis not present

## 2019-03-27 DIAGNOSIS — R7301 Impaired fasting glucose: Secondary | ICD-10-CM | POA: Diagnosis not present

## 2019-04-02 DIAGNOSIS — E7800 Pure hypercholesterolemia, unspecified: Secondary | ICD-10-CM | POA: Diagnosis not present

## 2019-04-02 DIAGNOSIS — R7301 Impaired fasting glucose: Secondary | ICD-10-CM | POA: Diagnosis not present

## 2019-04-02 DIAGNOSIS — R29898 Other symptoms and signs involving the musculoskeletal system: Secondary | ICD-10-CM | POA: Diagnosis not present

## 2019-04-15 DIAGNOSIS — E7800 Pure hypercholesterolemia, unspecified: Secondary | ICD-10-CM | POA: Diagnosis not present

## 2019-04-15 DIAGNOSIS — N400 Enlarged prostate without lower urinary tract symptoms: Secondary | ICD-10-CM | POA: Diagnosis not present

## 2019-04-15 DIAGNOSIS — I10 Essential (primary) hypertension: Secondary | ICD-10-CM | POA: Diagnosis not present

## 2019-04-15 DIAGNOSIS — E039 Hypothyroidism, unspecified: Secondary | ICD-10-CM | POA: Diagnosis not present

## 2019-04-15 DIAGNOSIS — I2510 Atherosclerotic heart disease of native coronary artery without angina pectoris: Secondary | ICD-10-CM | POA: Diagnosis not present

## 2019-04-17 ENCOUNTER — Encounter: Payer: Self-pay | Admitting: Orthopedic Surgery

## 2019-04-17 ENCOUNTER — Ambulatory Visit (INDEPENDENT_AMBULATORY_CARE_PROVIDER_SITE_OTHER): Payer: Medicare Other | Admitting: Orthopedic Surgery

## 2019-04-17 ENCOUNTER — Other Ambulatory Visit: Payer: Self-pay

## 2019-04-17 ENCOUNTER — Telehealth: Payer: Self-pay

## 2019-04-17 DIAGNOSIS — M1712 Unilateral primary osteoarthritis, left knee: Secondary | ICD-10-CM

## 2019-04-17 DIAGNOSIS — M7502 Adhesive capsulitis of left shoulder: Secondary | ICD-10-CM

## 2019-04-17 MED ORDER — DICLOFENAC SODIUM 1 % TD GEL: TRANSDERMAL | 0 refills | Status: DC

## 2019-04-17 MED ORDER — BUPIVACAINE HCL 0.25 % IJ SOLN
4.0000 mL | INTRAMUSCULAR | Status: AC | PRN
  Administered 2019-04-17: 4 mL via INTRA_ARTICULAR

## 2019-04-17 MED ORDER — LIDOCAINE HCL 1 % IJ SOLN
5.0000 mL | INTRAMUSCULAR | Status: AC | PRN
  Administered 2019-04-17: 5 mL

## 2019-04-17 NOTE — Telephone Encounter (Signed)
Can we get patient approved for gel injection in July?

## 2019-04-17 NOTE — Progress Notes (Addendum)
Office Visit Note   Patient: Rick Turner           Date of Birth: 09-07-1951           MRN: [id number] Visit Date: 04/17/2019 Requested by: [name], MD [address] [place], [place] [address] PCP: [name], MD  Subjective: Chief Complaint  Patient presents with  . Left Shoulder - Follow-up    HPI: Rick Turner is a patient with left frozen shoulder which was early.  Had a injection.  Did well with that injection and now has regained very good range of motion.  He also reports left knee pain which is primarily medial.  Denies any mechanical symptoms.  Has known history of left knee arthritis.  Uses Voltaren gel which helps.              ROS: All systems reviewed are negative as they relate to the chief complaint within the history of present illness.  Patient denies  fevers or chills.   Assessment & Plan: Visit Diagnoses:  1. Adhesive capsulitis of left shoulder     Plan: Impression is adhesive capsulitis of left shoulder which is improved.  Decision point today was for or against another intra-articular injection but based on his response I think that he would do well with continuing exercising that left shoulder.  In regards to that left knee I think he has exacerbation of existing arthritis.  Plan Toradol injection as it is too soon for another cortisone shot and he is scheduled for gel injection in July.  This patient is diagnosed with osteoarthritis of the knee(s).    Radiographs show evidence of joint space narrowing, osteophytes, subchondral sclerosis and/or subchondral cysts.  This patient has knee pain which interferes with functional and activities of daily living.    This patient has experienced inadequate response, adverse effects and/or intolerance with conservative treatments such as acetaminophen, NSAIDS, topical creams, physical therapy or regular exercise, knee bracing and/or weight loss.   This patient has experienced inadequate response or  has a contraindication to intra articular steroid injections for at least 3 months.   This patient is not scheduled to have a total knee replacement within 6 months of starting treatment with viscosupplementation.   Follow-Up Instructions: No follow-ups on file.   Orders:  No orders of the defined types were placed in this encounter.  Meds ordered this encounter  Medications  . diclofenac sodium (VOLTAREN) 1 % GEL    Sig: APPLY TO AFFECTED AREA ONCE A DAY AS NEEDED FOR PAIN    Dispense:  100 g    Refill:  0      Procedures: Large Joint Inj: L knee on 04/17/2019 8:56 AM Indications: diagnostic evaluation, joint swelling and pain Details: 18 G 1.5 in needle, superolateral approach  Arthrogram: No  Medications: 5 mL lidocaine 1 %; 4 mL bupivacaine 0.25 % Outcome: tolerated well, no immediate complications Procedure, treatment alternatives, risks and benefits explained, specific risks discussed. Consent was given by the patient. Immediately prior to procedure a time out was called to verify the correct patient, procedure, equipment, support staff and site/side marked as required. Patient was prepped and draped in the usual sterile fashion.     30 mg of Toradol injected in addition to the bupivacaine into that left knee.  Clinical Data: No additional findings.  Objective: Vital Signs: There were no vitals taken for this visit.  Physical Exam:   Constitutional: Patient appears well-developed HEENT:  Head: Normocephalic  Eyes:EOM are normal Neck: Normal range of motion Cardiovascular: Normal rate Pulmonary/chest: Effort normal Neurologic: Patient is alert Skin: Skin is warm Psychiatric: Patient has normal mood and affect    Ortho Exam: Ortho exam demonstrates good range of motion of that left knee with no effusion but he does have some medial joint line tenderness.  Collateral cruciate ligaments are stable.  Pedal pulses palpable.  No groin pain with internal X rotation  of the leg.  On the left shoulder he has essentially nearly symmetric range of motion left versus right with only about a 5 degrees side to side difference.  Specialty Comments:  No specialty comments available.  Imaging: No results found.   PMFS History: Patient Active Problem List   Diagnosis Date Noted  . Rib fracture 02/09/2019  . 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 Dry eye syndrome 08/20/2013  . Nuclear sclerotic cataract 08/20/2013  . Pseudoaphakia 08/20/2013  . Detached retina 08/20/2013   Past Medical History:  Diagnosis Date  . Acne rosacea   . Arthritis   . BPH (benign prostatic hypertrophy)   . Clavicle fracture    left  . Detached retina    left   . Fx ankle    left  . Hyperlipidemia   . Hypertension   . Hypothyroidism   . OSA (obstructive sleep apnea)    mild  . Seasonal allergies     Family History  Problem Relation Age of Onset  . CAD Mother   . CAD Father   . Hypertension Brother   . Hypertension Brother     Past Surgical History:  Procedure Laterality Date  . CATARACT EXTRACTION     left  . detached retiina     left  . EYE SURGERY     lasix  . HERNIA REPAIR    . TONSILLECTOMY     Social History   Occupational History  . Not on file  Tobacco Use  . Smoking status: Never Smoker  . Smokeless tobacco: Never Used  Substance and Sexual Activity  . Alcohol use: Yes    Alcohol/week: 6.0 - 10.0 standard drinks    Types: 6 - 10 Standard drinks or equivalent per week  . Drug use: Never  . Sexual activity: Not on file

## 2019-04-17 NOTE — Telephone Encounter (Signed)
Noted  

## 2019-04-18 ENCOUNTER — Telehealth: Payer: Self-pay

## 2019-04-18 NOTE — Telephone Encounter (Signed)
Submitted VOB for Monovisc, left knee. 

## 2019-04-25 ENCOUNTER — Telehealth: Payer: Self-pay

## 2019-04-25 NOTE — Telephone Encounter (Signed)
Called and left a VM for patient to CB to schedule an appointment with Dr. Marlou Sa for gel injection.  Approved for Monovisc, left knee. Buy & Bill Covered at 100% through 2nd insurance. No Co-pay No PA required  Please make appt.after 04/25/2019.

## 2019-05-01 ENCOUNTER — Encounter: Payer: Self-pay | Admitting: Orthopedic Surgery

## 2019-05-01 ENCOUNTER — Ambulatory Visit (INDEPENDENT_AMBULATORY_CARE_PROVIDER_SITE_OTHER): Payer: Medicare Other | Admitting: Orthopedic Surgery

## 2019-05-01 ENCOUNTER — Other Ambulatory Visit: Payer: Self-pay

## 2019-05-01 VITALS — Ht 71.0 in | Wt 240.0 lb

## 2019-05-01 DIAGNOSIS — M1712 Unilateral primary osteoarthritis, left knee: Secondary | ICD-10-CM

## 2019-05-02 ENCOUNTER — Encounter: Payer: Self-pay | Admitting: Orthopedic Surgery

## 2019-05-02 DIAGNOSIS — M1712 Unilateral primary osteoarthritis, left knee: Secondary | ICD-10-CM | POA: Diagnosis not present

## 2019-05-02 MED ORDER — HYALURONAN 88 MG/4ML IX SOSY
88.0000 mg | PREFILLED_SYRINGE | INTRA_ARTICULAR | Status: AC | PRN
  Administered 2019-05-02: 88 mg via INTRA_ARTICULAR

## 2019-05-02 MED ORDER — LIDOCAINE HCL 1 % IJ SOLN
5.0000 mL | INTRAMUSCULAR | Status: AC | PRN
  Administered 2019-05-02: 5 mL

## 2019-05-02 NOTE — Progress Notes (Signed)
   Procedure Note  Patient: Rick Turner             Date of Birth: 08-01-1951           MRN: [id number]             Visit Date: 05/01/2019  Procedures: Visit Diagnoses: No diagnosis found.  Large Joint Inj: L knee on 05/02/2019 10:21 PM Indications: pain, joint swelling and diagnostic evaluation Details: 18 G 1.5 in needle, superolateral approach  Arthrogram: No  Medications: 5 mL lidocaine 1 %; 88 mg Hyaluronan 88 MG/4ML Outcome: tolerated well, no immediate complications Procedure, treatment alternatives, risks and benefits explained, specific risks discussed. Consent was given by the patient. Immediately prior to procedure a time out was called to verify the correct patient, procedure, equipment, support staff and site/side marked as required. Patient was prepped and draped in the usual sterile fashion.

## 2019-06-06 DIAGNOSIS — E7800 Pure hypercholesterolemia, unspecified: Secondary | ICD-10-CM | POA: Diagnosis not present

## 2019-06-06 DIAGNOSIS — N400 Enlarged prostate without lower urinary tract symptoms: Secondary | ICD-10-CM | POA: Diagnosis not present

## 2019-06-06 DIAGNOSIS — E039 Hypothyroidism, unspecified: Secondary | ICD-10-CM | POA: Diagnosis not present

## 2019-06-06 DIAGNOSIS — I2510 Atherosclerotic heart disease of native coronary artery without angina pectoris: Secondary | ICD-10-CM | POA: Diagnosis not present

## 2019-06-06 DIAGNOSIS — I10 Essential (primary) hypertension: Secondary | ICD-10-CM | POA: Diagnosis not present

## 2019-06-11 DIAGNOSIS — Z03818 Encounter for observation for suspected exposure to other biological agents ruled out: Secondary | ICD-10-CM | POA: Diagnosis not present

## 2019-06-21 ENCOUNTER — Other Ambulatory Visit: Payer: Self-pay | Admitting: Orthopedic Surgery

## 2019-06-21 ENCOUNTER — Telehealth: Payer: Self-pay | Admitting: Orthopedic Surgery

## 2019-06-21 NOTE — Telephone Encounter (Signed)
[facility] called to get clarification on the patient's Diclofenac Gel.  The pharmacist needs to know how much the patient will be using per day and for how many days.  CB#[phone].  Thank you.

## 2019-06-21 NOTE — Telephone Encounter (Signed)
Tried calling multiple times, never could get answer, greeting kept repeating itself  And would never transfer me to a voicemail or a person in pharmacy.

## 2019-06-26 ENCOUNTER — Other Ambulatory Visit: Payer: Self-pay

## 2019-06-26 MED ORDER — DICLOFENAC SODIUM 1 % TD GEL: TRANSDERMAL | 2 refills | Status: DC

## 2019-06-26 NOTE — Progress Notes (Signed)
FYI Received fax from pharmacy asking for grams of voltaren gel be changed from 30 to 100 because it comes as 100g tube. This change was submitted to pharmacy.

## 2019-06-27 ENCOUNTER — Other Ambulatory Visit: Payer: Self-pay | Admitting: Orthopedic Surgery

## 2019-07-01 DIAGNOSIS — Z23 Encounter for immunization: Secondary | ICD-10-CM | POA: Diagnosis not present

## 2019-07-31 ENCOUNTER — Other Ambulatory Visit: Payer: Self-pay | Admitting: Orthopedic Surgery

## 2019-07-31 ENCOUNTER — Ambulatory Visit: Payer: Self-pay

## 2019-07-31 ENCOUNTER — Other Ambulatory Visit: Payer: Self-pay

## 2019-07-31 ENCOUNTER — Ambulatory Visit (INDEPENDENT_AMBULATORY_CARE_PROVIDER_SITE_OTHER): Payer: Medicare Other | Admitting: Orthopedic Surgery

## 2019-07-31 DIAGNOSIS — M19012 Primary osteoarthritis, left shoulder: Secondary | ICD-10-CM | POA: Diagnosis not present

## 2019-07-31 DIAGNOSIS — M19011 Primary osteoarthritis, right shoulder: Secondary | ICD-10-CM

## 2019-07-31 DIAGNOSIS — G8929 Other chronic pain: Secondary | ICD-10-CM

## 2019-07-31 DIAGNOSIS — M25512 Pain in left shoulder: Secondary | ICD-10-CM | POA: Diagnosis not present

## 2019-08-03 ENCOUNTER — Encounter: Payer: Self-pay | Admitting: Orthopedic Surgery

## 2019-08-03 DIAGNOSIS — M25512 Pain in left shoulder: Secondary | ICD-10-CM | POA: Diagnosis not present

## 2019-08-03 DIAGNOSIS — M19012 Primary osteoarthritis, left shoulder: Secondary | ICD-10-CM

## 2019-08-03 MED ORDER — LIDOCAINE HCL 1 % IJ SOLN
5.0000 mL | INTRAMUSCULAR | Status: AC | PRN
  Administered 2019-08-03: 5 mL

## 2019-08-03 MED ORDER — BUPIVACAINE HCL 0.5 % IJ SOLN
9.0000 mL | INTRAMUSCULAR | Status: AC | PRN
  Administered 2019-08-03: 9 mL via INTRA_ARTICULAR

## 2019-08-03 MED ORDER — METHYLPREDNISOLONE ACETATE 40 MG/ML IJ SUSP
40.0000 mg | INTRAMUSCULAR | Status: AC | PRN
  Administered 2019-08-03: 40 mg via INTRA_ARTICULAR

## 2019-08-03 NOTE — Progress Notes (Signed)
Office Visit Note   Patient: Rick Turner           Date of Birth: [birth year]           MRN: [id number] Visit Date: 07/31/2019 Requested by: [name], MD [address] [place] [address] [place],  [place] [address] PCP: [name], MD  Subjective: Chief Complaint  Patient presents with  . Left Shoulder - Pain    HPI: Rick Turner is a patient with left shoulder pain.'s been relatively severe for the past month.  Had prior injection in April which only gave him about 50% relief.  Has known history of right shoulder arthritis as well.  He hears a sound in his shoulder when he is doing resistance exercises.  The shot he had before was a subacromial injection.  Takes ibuprofen daily and uses topical.  He stopped doing push-ups.              ROS: All systems reviewed are negative as they relate to the chief complaint within the history of present illness.  Patient denies  fevers or chills.   Assessment & Plan: Visit Diagnoses:  1. Chronic left shoulder pain   2. Bilateral shoulder region arthritis     Plan: Impression is bilateral shoulder arthritis.  Radiographs today confirm what we find on physical examination which is bone-on-bone glenohumeral joint arthritis.  He still has a pretty reasonable range of motion on that left-hand side and good rotator cuff strength.  I think intra-articular injection is indicated today for pain relief.  Follow-up with me as needed.  He has both knee and shoulder arthritis at this time.  Encouraged him to avoid compressive loading across the joint which would be done when he is doing push-ups.  Follow-up with me as needed.  Follow-Up Instructions: Return if symptoms worsen or fail to improve.   Orders:  Orders Placed This Encounter  Procedures  . XR Shoulder Left   No orders of the defined types were placed in this encounter.     Procedures: Large Joint Inj: L glenohumeral on 08/03/2019 8:40 AM Indications: diagnostic evaluation and pain  Details: 18 G 1.5 in needle, posterior approach  Arthrogram: No  Medications: 9 mL bupivacaine 0.5 %; 40 mg methylPREDNISolone acetate 40 MG/ML; 5 mL lidocaine 1 % Outcome: tolerated well, no immediate complications Procedure, treatment alternatives, risks and benefits explained, specific risks discussed. Consent was given by the patient. Immediately prior to procedure a time out was called to verify the correct patient, procedure, equipment, support staff and site/side marked as required. Patient was prepped and draped in the usual sterile fashion.       Clinical Data: No additional findings.  Objective: Vital Signs: There were no vitals taken for this visit.  Physical Exam:   Constitutional: Patient appears well-developed HEENT:  Head: Normocephalic Eyes:EOM are normal Neck: Normal range of motion Cardiovascular: Normal rate Pulmonary/chest: Effort normal Neurologic: Patient is alert Skin: Skin is warm Psychiatric: Patient has normal mood and affect    Ortho Exam: Ortho exam demonstrates good rotator cuff strength bilaterally to infraspinatus supraspinatus and subscap muscle testing.  No masses lymphadenopathy or skin changes noted in that left shoulder girdle region.  Rotator cuff strength is good.  Does have some coarse grinding and crepitus more on the right than the left.  External rotation is about 30 degrees bilaterally.  Specialty Comments:  No specialty comments available.  Imaging: Xr Shoulder Left  Result Date: 08/03/2019 AP left shoulder demonstrates narrowing of  the glenohumeral joint consistent with moderate to severe arthritis along with inferior humeral head spurring.  Acromiohumeral distance is normal.  Mild AC joint degenerative arthritis noted.  Visualized lung fields clear.  No acute fracture or dislocation.    PMFS History: Patient Active Problem List   Diagnosis Date Noted  . Rib fracture 02/09/2019  . Macula-off rhegmatogenous retinal  detachment 04/19/2016  . Dry eye syndrome 08/20/2013  . Nuclear sclerotic cataract 08/20/2013  . Pseudoaphakia 08/20/2013  . Detached retina 08/20/2013   Past Medical History:  Diagnosis Date  . Acne rosacea   . Arthritis   . BPH (benign prostatic hypertrophy)   . Clavicle fracture    left  . Detached retina    left   . Fx ankle    left  . Hyperlipidemia   . Hypertension   . Hypothyroidism   . OSA (obstructive sleep apnea)    mild  . Seasonal allergies     Family History  Problem Relation Age of Onset  . CAD Mother   . CAD Father   . Hypertension Brother   . Hypertension Brother     Past Surgical History:  Procedure Laterality Date  . CATARACT EXTRACTION     left  . detached retiina     left  . EYE SURGERY     lasix  . HERNIA REPAIR    . TONSILLECTOMY     Social History   Occupational History  . Not on file  Tobacco Use  . Smoking status: Never Smoker  . Smokeless tobacco: Never Used  Substance and Sexual Activity  . Alcohol use: Yes    Alcohol/week: 6.0 - 10.0 standard drinks    Types: 6 - 10 Standard drinks or equivalent per week  . Drug use: Never  . Sexual activity: Not on file

## 2019-08-19 DIAGNOSIS — Z20828 Contact with and (suspected) exposure to other viral communicable diseases: Secondary | ICD-10-CM | POA: Diagnosis not present

## 2019-08-21 DIAGNOSIS — I10 Essential (primary) hypertension: Secondary | ICD-10-CM | POA: Diagnosis not present

## 2019-08-21 DIAGNOSIS — E039 Hypothyroidism, unspecified: Secondary | ICD-10-CM | POA: Diagnosis not present

## 2019-08-21 DIAGNOSIS — I2510 Atherosclerotic heart disease of native coronary artery without angina pectoris: Secondary | ICD-10-CM | POA: Diagnosis not present

## 2019-08-21 DIAGNOSIS — E7800 Pure hypercholesterolemia, unspecified: Secondary | ICD-10-CM | POA: Diagnosis not present

## 2019-08-21 DIAGNOSIS — N400 Enlarged prostate without lower urinary tract symptoms: Secondary | ICD-10-CM | POA: Diagnosis not present

## 2019-08-26 DIAGNOSIS — R972 Elevated prostate specific antigen [PSA]: Secondary | ICD-10-CM | POA: Diagnosis not present

## 2019-08-26 DIAGNOSIS — R338 Other retention of urine: Secondary | ICD-10-CM | POA: Diagnosis not present

## 2019-08-26 DIAGNOSIS — N5201 Erectile dysfunction due to arterial insufficiency: Secondary | ICD-10-CM | POA: Diagnosis not present

## 2019-09-06 ENCOUNTER — Other Ambulatory Visit: Payer: Self-pay

## 2019-09-06 ENCOUNTER — Encounter: Payer: Self-pay | Admitting: Orthopedic Surgery

## 2019-09-06 ENCOUNTER — Ambulatory Visit (INDEPENDENT_AMBULATORY_CARE_PROVIDER_SITE_OTHER): Payer: Medicare Other | Admitting: Orthopedic Surgery

## 2019-09-06 DIAGNOSIS — M1712 Unilateral primary osteoarthritis, left knee: Secondary | ICD-10-CM | POA: Diagnosis not present

## 2019-09-06 MED ORDER — LIDOCAINE HCL 1 % IJ SOLN
5.0000 mL | INTRAMUSCULAR | Status: AC | PRN
  Administered 2019-09-06: 5 mL

## 2019-09-06 MED ORDER — BUPIVACAINE HCL 0.25 % IJ SOLN
4.0000 mL | INTRAMUSCULAR | Status: AC | PRN
  Administered 2019-09-06: 4 mL via INTRA_ARTICULAR

## 2019-09-06 MED ORDER — METHYLPREDNISOLONE ACETATE 40 MG/ML IJ SUSP
40.0000 mg | INTRAMUSCULAR | Status: AC | PRN
  Administered 2019-09-06: 40 mg via INTRA_ARTICULAR

## 2019-09-06 NOTE — Progress Notes (Signed)
Office Visit Note   Patient: Rick Turner           Date of Birth: 06/16/1951           MRN: [id number] Visit Date: 09/06/2019 Requested by: [name], MD [facility] [address] [place],  [place] [address] PCP: [name], MD  Subjective: Chief Complaint  Patient presents with  . Left Knee - Pain    HPI: Truth is a 68 year old patient with bilateral knee arthritis.  He is on a 56-day diet and is lost about 25 pounds.  Overall his knees are feeling better.  Had gel injection in June and he states that the gel injections last little bit longer than the cortisone injections.  Both help him.  He has been playing golf.  Wife scheduled for total knee replacement in 2 weeks.  His back is been bothering him a little bit.              ROS: All systems reviewed are negative as they relate to the chief complaint within the history of present illness.  Patient denies  fevers or chills.   Assessment & Plan: Visit Diagnoses:  1. Arthritis of left knee     Plan: Impression is bilateral knee arthritis.  Plan is left knee cortisone injection today.  No effusion today.  Follow-up with me as needed.  May need gel injection in 3 months to continue delaying his need for knee replacement.This patient is diagnosed with osteoarthritis of the knee(s).    Radiographs show evidence of joint space narrowing, osteophytes, subchondral sclerosis and/or subchondral cysts.  This patient has knee pain which interferes with functional and activities of daily living.    This patient has experienced inadequate response, adverse effects and/or intolerance with conservative treatments such as acetaminophen, NSAIDS, topical creams, physical therapy or regular exercise, knee bracing and/or weight loss.   This patient has experienced inadequate response or has a contraindication to intra articular steroid injections for at least 3 months.   This patient is not scheduled to have a total knee replacement  within 6 months of starting treatment with viscosupplementation.   Follow-Up Instructions: Return if symptoms worsen or fail to improve.   Orders:  No orders of the defined types were placed in this encounter.  No orders of the defined types were placed in this encounter.     Procedures: Large Joint Inj: L knee on 09/06/2019 9:16 AM Indications: diagnostic evaluation, joint swelling and pain Details: 18 G 1.5 in needle, superolateral approach  Arthrogram: No  Medications: 5 mL lidocaine 1 %; 40 mg methylPREDNISolone acetate 40 MG/ML; 4 mL bupivacaine 0.25 % Outcome: tolerated well, no immediate complications Procedure, treatment alternatives, risks and benefits explained, specific risks discussed. Consent was given by the patient. Immediately prior to procedure a time out was called to verify the correct patient, procedure, equipment, support staff and site/side marked as required. Patient was prepped and draped in the usual sterile fashion.       Clinical Data: No additional findings.  Objective: Vital Signs: There were no vitals taken for this visit.  Physical Exam:   Constitutional: Patient appears well-developed HEENT:  Head: Normocephalic Eyes:EOM are normal Neck: Normal range of motion Cardiovascular: Normal rate Pulmonary/chest: Effort normal Neurologic: Patient is alert Skin: Skin is warm Psychiatric: Patient has normal mood and affect    Ortho Exam: Ortho exam demonstrates normal gait alignment.  He has no effusion in the left knee.  Full extension and flexion  easily to 120.  Collateral crucial ligaments are stable.  Pedal pulses palpable.  No masses lymphadenopathy or skin changes noted in that left knee region.  Medial greater than lateral joint line tenderness.  Specialty Comments:  No specialty comments available.  Imaging: No results found.   PMFS History: Patient Active Problem List   Diagnosis Date Noted  . Rib fracture 02/09/2019  .  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 Dry eye syndrome 08/20/2013  . Nuclear sclerotic cataract 08/20/2013  . Pseudoaphakia 08/20/2013  . Detached retina 08/20/2013   Past Medical History:  Diagnosis Date  . Acne rosacea   . Arthritis   . BPH (benign prostatic hypertrophy)   . Clavicle fracture    left  . Detached retina    left   . Fx ankle    left  . Hyperlipidemia   . Hypertension   . Hypothyroidism   . OSA (obstructive sleep apnea)    mild  . Seasonal allergies     Family History  Problem Relation Age of Onset  . CAD Mother   . CAD Father   . Hypertension Brother   . Hypertension Brother     Past Surgical History:  Procedure Laterality Date  . CATARACT EXTRACTION     left  . detached retiina     left  . EYE SURGERY     lasix  . HERNIA REPAIR    . TONSILLECTOMY     Social History   Occupational History  . Not on file  Tobacco Use  . Smoking status: Never Smoker  . Smokeless tobacco: Never Used  Substance and Sexual Activity  . Alcohol use: Yes    Alcohol/week: 6.0 - 10.0 standard drinks    Types: 6 - 10 Standard drinks or equivalent per week  . Drug use: Never  . Sexual activity: Not on file

## 2019-09-09 DIAGNOSIS — I2510 Atherosclerotic heart disease of native coronary artery without angina pectoris: Secondary | ICD-10-CM | POA: Diagnosis not present

## 2019-09-09 DIAGNOSIS — E039 Hypothyroidism, unspecified: Secondary | ICD-10-CM | POA: Diagnosis not present

## 2019-09-09 DIAGNOSIS — E7800 Pure hypercholesterolemia, unspecified: Secondary | ICD-10-CM | POA: Diagnosis not present

## 2019-09-09 DIAGNOSIS — I10 Essential (primary) hypertension: Secondary | ICD-10-CM | POA: Diagnosis not present

## 2019-09-09 DIAGNOSIS — N400 Enlarged prostate without lower urinary tract symptoms: Secondary | ICD-10-CM | POA: Diagnosis not present

## 2019-11-08 HISTORY — PX: TOTAL SHOULDER REPLACEMENT: SUR1217

## 2019-12-02 ENCOUNTER — Ambulatory Visit (INDEPENDENT_AMBULATORY_CARE_PROVIDER_SITE_OTHER): Payer: Medicare Other | Admitting: Orthopedic Surgery

## 2019-12-02 ENCOUNTER — Other Ambulatory Visit: Payer: Self-pay

## 2019-12-02 ENCOUNTER — Encounter: Payer: Self-pay | Admitting: Orthopedic Surgery

## 2019-12-02 DIAGNOSIS — M25512 Pain in left shoulder: Secondary | ICD-10-CM

## 2019-12-02 DIAGNOSIS — G8929 Other chronic pain: Secondary | ICD-10-CM

## 2019-12-02 MED ORDER — CELECOXIB 100 MG PO CAPS: 100.0000 mg | ORAL_CAPSULE | Freq: Two times a day (BID) | ORAL | 0 refills | Status: DC

## 2019-12-03 ENCOUNTER — Encounter: Payer: Self-pay | Admitting: Orthopedic Surgery

## 2019-12-03 NOTE — Progress Notes (Signed)
Office Visit Note   Patient: Rick Turner           Date of Birth: 11/07/1951           MRN: [id number] Visit Date: 12/02/2019 Requested by: [name], MD [address] [place],  [place] [address] PCP: [name], MD  Subjective: Chief Complaint  Patient presents with  . Left Shoulder - Pain, Follow-up  . Left Arm - Pain    HPI: Rick Turner is a patient with left arm pain.  He had a left shoulder injection 07/31/2019 which helped some.  Now is having more pain in the arm radiating to the elbow.  Describes some weakness as well as diminished range of motion.  Hard for him to raise his arm.  Tried Advil and Voltaren gel which helps some.  Hard for him to play golf.  States that his arm gives way.  He was doing some weightlifting in the gym in 2019 and he heard a pop.  He is only had radiographs in 2020 which were fairly unremarkable.  He is tried K tape as well.              ROS: All systems reviewed are negative as they relate to the chief complaint within the history of present illness.  Patient denies  fevers or chills.   Assessment & Plan: Visit Diagnoses:  1. Chronic left shoulder pain     Plan: Impression is left arm pain which could be early frozen shoulder versus small rotator cuff tear.  Symptoms have been ongoing now for about 4 or 5 months.  Did have a traumatic injury.  Has failed conservative management.  On a changing from Advil to Celebrex because the Advil is not really particularly helpful.  Also needs MRI arthrogram of the left shoulder to evaluate for rotator cuff tear.  I will see him back after that study.  Follow-Up Instructions: Return for after MRI.   Orders:  Orders Placed This Encounter  Procedures  . MR Shoulder Left w/o contrast   Meds ordered this encounter  Medications  . celecoxib (CELEBREX) 100 MG capsule    Sig: Take 1 capsule (100 mg total) by mouth 2 (two) times daily.    Dispense:  60 capsule    Refill:  0       Procedures: No procedures performed   Clinical Data: No additional findings.  Objective: Vital Signs: There were no vitals taken for this visit.  Physical Exam:   Constitutional: Patient appears well-developed HEENT:  Head: Normocephalic Eyes:EOM are normal Neck: Normal range of motion Cardiovascular: Normal rate Pulmonary/chest: Effort normal Neurologic: Patient is alert Skin: Skin is warm Psychiatric: Patient has normal mood and affect    Ortho Exam: Ortho exam demonstrates full active and passive range of motion of the right arm.  Neck has very good range of motion.  Radial pulses intact bilaterally.  Patient has a little crepitus with passive range of motion of the left arm compared to the right.  Pretty good strength infraspinatus supraspinatus and subscap testing.  AC joint nontender on the left and right.  No masses lymphadenopathy or skin changes noted in that shoulder girdle region.  Does have a little bit less rotation to about L3 on the left compared to T12 on the right.  Specialty Comments:  No specialty comments available.  Imaging: No results found.   PMFS History: Patient Active Problem List   Diagnosis Date Noted  . Rib fracture 02/09/2019  .  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 Dry eye syndrome 08/20/2013  . Nuclear sclerotic cataract 08/20/2013  . Pseudoaphakia 08/20/2013  . Detached retina 08/20/2013   Past Medical History:  Diagnosis Date  . Acne rosacea   . Arthritis   . BPH (benign prostatic hypertrophy)   . Clavicle fracture    left  . Detached retina    left   . Fx ankle    left  . Hyperlipidemia   . Hypertension   . Hypothyroidism   . OSA (obstructive sleep apnea)    mild  . Seasonal allergies     Family History  Problem Relation Age of Onset  . CAD Mother   . CAD Father   . Hypertension Brother   . Hypertension Brother     Past Surgical History:  Procedure Laterality Date  . CATARACT EXTRACTION      left  . detached retiina     left  . EYE SURGERY     lasix  . HERNIA REPAIR    . TONSILLECTOMY     Social History   Occupational History  . Not on file  Tobacco Use  . Smoking status: Never Smoker  . Smokeless tobacco: Never Used  Substance and Sexual Activity  . Alcohol use: Yes    Alcohol/week: 6.0 - 10.0 standard drinks    Types: 6 - 10 Standard drinks or equivalent per week  . Drug use: Never  . Sexual activity: Not on file

## 2019-12-04 ENCOUNTER — Ambulatory Visit: Payer: Medicare Other

## 2019-12-09 ENCOUNTER — Other Ambulatory Visit: Payer: Self-pay | Admitting: Orthopedic Surgery

## 2019-12-09 DIAGNOSIS — M25512 Pain in left shoulder: Secondary | ICD-10-CM

## 2019-12-13 ENCOUNTER — Ambulatory Visit: Payer: Medicare Other

## 2019-12-17 ENCOUNTER — Telehealth: Payer: Self-pay | Admitting: Orthopedic Surgery

## 2019-12-17 NOTE — Telephone Encounter (Signed)
Patient called requesting authorization for gel injections. Patient has an upcoming appointment for MRI review 01/02/2020 and would like injection. Patient phone number is [phone]

## 2019-12-17 NOTE — Telephone Encounter (Signed)
See below. Can you please submit for auth.

## 2019-12-18 NOTE — Telephone Encounter (Signed)
Submitted for VOB for synvisc one- left knee 

## 2019-12-19 ENCOUNTER — Telehealth: Payer: Self-pay

## 2019-12-19 NOTE — Telephone Encounter (Signed)
Approved for Synvisc one-left knee Dr. Latanya Presser and [facility] No Copay No OOP- 2nd insurance to pick up the cost after medicare No prior auth required

## 2019-12-23 ENCOUNTER — Encounter (INDEPENDENT_AMBULATORY_CARE_PROVIDER_SITE_OTHER): Payer: Medicare Other | Admitting: Ophthalmology

## 2019-12-23 DIAGNOSIS — H35372 Puckering of macula, left eye: Secondary | ICD-10-CM | POA: Diagnosis not present

## 2019-12-23 DIAGNOSIS — H338 Other retinal detachments: Secondary | ICD-10-CM | POA: Diagnosis not present

## 2019-12-23 DIAGNOSIS — H43813 Vitreous degeneration, bilateral: Secondary | ICD-10-CM | POA: Diagnosis not present

## 2019-12-23 DIAGNOSIS — H35033 Hypertensive retinopathy, bilateral: Secondary | ICD-10-CM

## 2019-12-23 DIAGNOSIS — I10 Essential (primary) hypertension: Secondary | ICD-10-CM

## 2019-12-25 ENCOUNTER — Ambulatory Visit: Payer: Medicare Other

## 2019-12-30 ENCOUNTER — Ambulatory Visit
Admission: RE | Admit: 2019-12-30 | Discharge: 2019-12-30 | Disposition: A | Discharge status: Home / Self Care | Payer: Medicare Other | Source: Ambulatory Visit | Attending: Orthopedic Surgery | Admitting: Orthopedic Surgery

## 2019-12-30 ENCOUNTER — Other Ambulatory Visit: Payer: Self-pay

## 2019-12-30 DIAGNOSIS — M25512 Pain in left shoulder: Secondary | ICD-10-CM

## 2019-12-30 DIAGNOSIS — M75112 Incomplete rotator cuff tear or rupture of left shoulder, not specified as traumatic: Secondary | ICD-10-CM | POA: Diagnosis not present

## 2019-12-30 DIAGNOSIS — G8929 Other chronic pain: Secondary | ICD-10-CM

## 2019-12-30 MED ORDER — IOPAMIDOL (ISOVUE-M 200) INJECTION 41%
12.0000 mL | Freq: Once | INTRAMUSCULAR | Status: AC
  Administered 2019-12-30: 16:00:00 12 mL via INTRA_ARTICULAR

## 2020-01-02 ENCOUNTER — Ambulatory Visit: Payer: Medicare Other | Admitting: Orthopedic Surgery

## 2020-01-02 ENCOUNTER — Other Ambulatory Visit: Payer: Self-pay

## 2020-01-02 ENCOUNTER — Ambulatory Visit (INDEPENDENT_AMBULATORY_CARE_PROVIDER_SITE_OTHER): Payer: Medicare Other | Admitting: Orthopedic Surgery

## 2020-01-02 DIAGNOSIS — M1712 Unilateral primary osteoarthritis, left knee: Secondary | ICD-10-CM | POA: Diagnosis not present

## 2020-01-02 DIAGNOSIS — M25512 Pain in left shoulder: Secondary | ICD-10-CM | POA: Diagnosis not present

## 2020-01-02 DIAGNOSIS — G8929 Other chronic pain: Secondary | ICD-10-CM | POA: Diagnosis not present

## 2020-01-03 ENCOUNTER — Encounter: Payer: Self-pay | Admitting: Family Medicine

## 2020-01-03 ENCOUNTER — Encounter: Payer: Self-pay | Admitting: Orthopedic Surgery

## 2020-01-03 ENCOUNTER — Ambulatory Visit: Payer: Self-pay

## 2020-01-03 ENCOUNTER — Ambulatory Visit (INDEPENDENT_AMBULATORY_CARE_PROVIDER_SITE_OTHER): Payer: Medicare Other | Admitting: Family Medicine

## 2020-01-03 DIAGNOSIS — M1712 Unilateral primary osteoarthritis, left knee: Secondary | ICD-10-CM

## 2020-01-03 DIAGNOSIS — M25512 Pain in left shoulder: Secondary | ICD-10-CM | POA: Diagnosis not present

## 2020-01-03 DIAGNOSIS — G8929 Other chronic pain: Secondary | ICD-10-CM

## 2020-01-03 MED ORDER — LIDOCAINE HCL 1 % IJ SOLN
5.0000 mL | INTRAMUSCULAR | Status: AC | PRN
  Administered 2020-01-03: 5 mL

## 2020-01-03 MED ORDER — HYLAN G-F 20 48 MG/6ML IX SOSY
48.0000 mg | PREFILLED_SYRINGE | INTRA_ARTICULAR | Status: AC | PRN
  Administered 2020-01-03: 48 mg via INTRA_ARTICULAR

## 2020-01-03 NOTE — Progress Notes (Signed)
Subjective: Patient is here for ultrasound-guided intra-articular left glenohumeral injection.  Has end-stage DJD.  Objective:  Very diminished overhead reach.  Procedure: Ultrasound-guided left glenohumeral injection: After sterile prep with Betadine, injected 8 cc 1% lidocaine without epinephrine and 40 mg methylprednisolone using a 22-gauge spinal needle, passing the needle through approach into the glenohumeral joint.  Injectate seen filling joint capsule.

## 2020-01-03 NOTE — Progress Notes (Signed)
Office Visit Note   Patient: Rick Turner           Date of Birth: [birth year]           MRN: [id number] Visit Date: 01/02/2020 Requested by: [name], MD [address] [place],  [place] [address] PCP: [name], MD  Subjective: Chief Complaint  Patient presents with  . Follow-up    HPI: Rick Turner presents for MRI scan review of his left shoulder.  He reports shoulder pain and stiffness.'s been worse in the last year.  Injection 923 helped.  He describes diminished range of motion as well as pain.  No prior shoulder surgery.  He wants to hold off on the shoulder surgery until at least this winter.  Left knee doing reasonably well but he presents now for Synvisc 1 injection as well.              ROS: All systems reviewed are negative as they relate to the chief complaint within the history of present illness.  Patient denies  fevers or chills.   Assessment & Plan: Visit Diagnoses:  1. Arthritis of left knee   2. Chronic left shoulder pain     Plan: Impression is left shoulder arthritis with no definite rotator cuff pathology.  I think he will need a total shoulder for that whenever he is ready.  Regarding the knee we will do Synvisc 1 injection today and he will need a knee replacement at sometime in the future when he deems that worth doing in terms of pain relief and functional disability.  Follow-Up Instructions: Return if symptoms worsen or fail to improve.   Orders:  No orders of the defined types were placed in this encounter.  No orders of the defined types were placed in this encounter.     Procedures: Large Joint Inj: L knee on 01/03/2020 10:22 PM Indications: pain, joint swelling and diagnostic evaluation Details: 18 G 1.5 in needle, superolateral approach  Arthrogram: No  Medications: 5 mL lidocaine 1 %; 48 mg Hylan 48 MG/6ML Outcome: tolerated well, no immediate complications Procedure, treatment alternatives, risks and benefits  explained, specific risks discussed. Consent was given by the patient. Immediately prior to procedure a time out was called to verify the correct patient, procedure, equipment, support staff and site/side marked as required. Patient was prepped and draped in the usual sterile fashion.       Clinical Data: No additional findings.  Objective: Vital Signs: There were no vitals taken for this visit.  Physical Exam:   Constitutional: Patient appears well-developed HEENT:  Head: Normocephalic Eyes:EOM are normal Neck: Normal range of motion Cardiovascular: Normal rate Pulmonary/chest: Effort normal Neurologic: Patient is alert Skin: Skin is warm Psychiatric: Patient has normal mood and affect    Ortho Exam: Ortho exam demonstrates painful range of motion of that left shoulder with about 25 degrees of external rotation 15 degrees of abduction rotator cuff strength is good infraspinatus supraspinatus and subscap muscle testing.  Does have about 90 degrees of forward flexion abduction but some coarse grinding with passive range of motion is noted.  This is more bone-on-bone and not rotator cuff.  Left knee demonstrates no effusion today but he does have a slight flexion contracture.  Motor sensory function to the foot is intact.  Specialty Comments:  No specialty comments available.  Imaging: US Guided Needle Placement  Result Date: 01/03/2020 Please see Notes tab for imaging impression.    PMFS History: Patient Active  Problem List   Diagnosis Date Noted  . Rib fracture 02/09/2019  . 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 Dry eye syndrome 08/20/2013  . Nuclear sclerotic cataract 08/20/2013  . Pseudoaphakia 08/20/2013  . Detached retina 08/20/2013   Past Medical History:  Diagnosis Date  . Acne rosacea   . Arthritis   . BPH (benign prostatic hypertrophy)   . Clavicle fracture    left  . Detached retina    left   . Fx ankle    left  .  Hyperlipidemia   . Hypertension   . Hypothyroidism   . OSA (obstructive sleep apnea)    mild  . Seasonal allergies     Family History  Problem Relation Age of Onset  . CAD Mother   . CAD Father   . Hypertension Brother   . Hypertension Brother     Past Surgical History:  Procedure Laterality Date  . CATARACT EXTRACTION     left  . detached retiina     left  . EYE SURGERY     lasix  . HERNIA REPAIR    . TONSILLECTOMY     Social History   Occupational History  . Not on file  Tobacco Use  . Smoking status: Never Smoker  . Smokeless tobacco: Never Used  Substance and Sexual Activity  . Alcohol use: Yes    Alcohol/week: 6.0 - 10.0 standard drinks    Types: 6 - 10 Standard drinks or equivalent per week  . Drug use: Never  . Sexual activity: Not on file

## 2020-01-09 DIAGNOSIS — E039 Hypothyroidism, unspecified: Secondary | ICD-10-CM | POA: Diagnosis not present

## 2020-01-30 DIAGNOSIS — E7800 Pure hypercholesterolemia, unspecified: Secondary | ICD-10-CM | POA: Diagnosis not present

## 2020-01-30 DIAGNOSIS — I2510 Atherosclerotic heart disease of native coronary artery without angina pectoris: Secondary | ICD-10-CM | POA: Diagnosis not present

## 2020-01-30 DIAGNOSIS — I10 Essential (primary) hypertension: Secondary | ICD-10-CM | POA: Diagnosis not present

## 2020-01-30 DIAGNOSIS — E039 Hypothyroidism, unspecified: Secondary | ICD-10-CM | POA: Diagnosis not present

## 2020-01-30 DIAGNOSIS — M1712 Unilateral primary osteoarthritis, left knee: Secondary | ICD-10-CM | POA: Diagnosis not present

## 2020-01-30 DIAGNOSIS — N400 Enlarged prostate without lower urinary tract symptoms: Secondary | ICD-10-CM | POA: Diagnosis not present

## 2020-02-21 DIAGNOSIS — I10 Essential (primary) hypertension: Secondary | ICD-10-CM | POA: Diagnosis not present

## 2020-02-24 DIAGNOSIS — R350 Frequency of micturition: Secondary | ICD-10-CM | POA: Diagnosis not present

## 2020-02-24 DIAGNOSIS — R972 Elevated prostate specific antigen [PSA]: Secondary | ICD-10-CM | POA: Diagnosis not present

## 2020-02-25 ENCOUNTER — Other Ambulatory Visit: Payer: Self-pay | Admitting: Internal Medicine

## 2020-02-25 ENCOUNTER — Other Ambulatory Visit: Payer: Self-pay

## 2020-02-25 ENCOUNTER — Ambulatory Visit
Admission: RE | Admit: 2020-02-25 | Discharge: 2020-02-25 | Disposition: A | Discharge status: Home / Self Care | Payer: Medicare Other | Source: Ambulatory Visit | Attending: Internal Medicine | Admitting: Internal Medicine

## 2020-02-25 DIAGNOSIS — R0609 Other forms of dyspnea: Secondary | ICD-10-CM

## 2020-02-25 DIAGNOSIS — E538 Deficiency of other specified B group vitamins: Secondary | ICD-10-CM | POA: Diagnosis not present

## 2020-02-25 DIAGNOSIS — R0600 Dyspnea, unspecified: Secondary | ICD-10-CM | POA: Diagnosis not present

## 2020-02-25 DIAGNOSIS — R29898 Other symptoms and signs involving the musculoskeletal system: Secondary | ICD-10-CM | POA: Diagnosis not present

## 2020-02-25 DIAGNOSIS — I700 Atherosclerosis of aorta: Secondary | ICD-10-CM | POA: Diagnosis not present

## 2020-02-27 ENCOUNTER — Other Ambulatory Visit: Payer: Self-pay | Admitting: Urology

## 2020-02-27 DIAGNOSIS — R972 Elevated prostate specific antigen [PSA]: Secondary | ICD-10-CM

## 2020-03-06 DIAGNOSIS — I10 Essential (primary) hypertension: Secondary | ICD-10-CM | POA: Diagnosis not present

## 2020-03-23 ENCOUNTER — Telehealth: Payer: Self-pay | Admitting: Family Medicine

## 2020-03-23 NOTE — Telephone Encounter (Signed)
Patient called advised he would like to get the gel injection at his next appointment. The number to contact patient is [phone]

## 2020-03-24 NOTE — Telephone Encounter (Signed)
Can we please get auth for this? °

## 2020-03-24 NOTE — Telephone Encounter (Signed)
It looks like this is Dr. Randel Pigg patient. Dr. Junius Roads only did an u/s-guided injection on his shoulder.

## 2020-03-25 ENCOUNTER — Ambulatory Visit
Admission: RE | Admit: 2020-03-25 | Discharge: 2020-03-25 | Disposition: A | Discharge status: Home / Self Care | Payer: Medicare Other | Source: Ambulatory Visit | Attending: Urology | Admitting: Urology

## 2020-03-25 ENCOUNTER — Other Ambulatory Visit: Payer: Self-pay

## 2020-03-25 DIAGNOSIS — R972 Elevated prostate specific antigen [PSA]: Secondary | ICD-10-CM | POA: Diagnosis not present

## 2020-03-25 MED ORDER — GADOBENATE DIMEGLUMINE 529 MG/ML IV SOLN
20.0000 mL | Freq: Once | INTRAVENOUS | Status: AC | PRN
  Administered 2020-03-25: 20 mL via INTRAVENOUS

## 2020-03-27 ENCOUNTER — Telehealth: Payer: Self-pay

## 2020-03-27 NOTE — Telephone Encounter (Signed)
Called and left a VM for patient to return my call concerning gel injection.

## 2020-03-27 NOTE — Telephone Encounter (Signed)
Talked with patient concerning gel injection.  Patient had wanted a gel injection for his left shoulder with Dr. Junius Roads.  Per Wendy May, we no longer give gel injections, patient would have to pay OOP.  Patient declined for gel injection.  Patient would like to know what other options Dr. Junius Roads suggest for his left shoulder?  Cb# [phone].  Please advise.  Thank you.

## 2020-03-27 NOTE — Telephone Encounter (Signed)
I called the patient with the other options. He will do some research on these and then let me know if he wishes to schedule an appointment.

## 2020-03-27 NOTE — Telephone Encounter (Signed)
Are there any other options, as far as injections? 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 Does he need to follow up with Dr. Marlou Sa about his options?

## 2020-03-27 NOTE — Telephone Encounter (Signed)
Could try dextrose prolotherapy.  PRP is another option but isn't covered by insurance ($475).

## 2020-03-27 NOTE — Telephone Encounter (Signed)
Noted  

## 2020-03-29 ENCOUNTER — Encounter: Payer: Self-pay | Admitting: Family Medicine

## 2020-03-31 ENCOUNTER — Ambulatory Visit (INDEPENDENT_AMBULATORY_CARE_PROVIDER_SITE_OTHER): Payer: Medicare Other | Admitting: Cardiology

## 2020-03-31 ENCOUNTER — Other Ambulatory Visit: Payer: Self-pay

## 2020-03-31 ENCOUNTER — Encounter: Payer: Self-pay | Admitting: Cardiology

## 2020-03-31 VITALS — BP 142/78 | HR 67 | Ht 71.0 in | Wt 229.6 lb

## 2020-03-31 DIAGNOSIS — E785 Hyperlipidemia, unspecified: Secondary | ICD-10-CM | POA: Diagnosis not present

## 2020-03-31 DIAGNOSIS — I10 Essential (primary) hypertension: Secondary | ICD-10-CM

## 2020-03-31 DIAGNOSIS — R0600 Dyspnea, unspecified: Secondary | ICD-10-CM | POA: Diagnosis not present

## 2020-03-31 DIAGNOSIS — I739 Peripheral vascular disease, unspecified: Secondary | ICD-10-CM

## 2020-03-31 DIAGNOSIS — R931 Abnormal findings on diagnostic imaging of heart and coronary circulation: Secondary | ICD-10-CM

## 2020-03-31 DIAGNOSIS — R0609 Other forms of dyspnea: Secondary | ICD-10-CM

## 2020-03-31 NOTE — Progress Notes (Signed)
Cardiology Office Note:    Date:  03/31/2020   ID:  Rick Turner, DOB 1951-05-07, MRN [id number]  PCP:  [name], MD  Cardiologist:  No primary care provider on file.  Electrophysiologist:  None   Referring MD: [name], MD   Chief Complaint  Patient presents with  . Shortness of Breath    History of Present Illness:    Rick Turner is a 69 y.o. male with a hx of CAD, hypertension, hyperlipidemia, hypothyroidism, OSA who is referred by [name] for evaluation of dyspnea on exertion.  History of elevated calcium score, was 645 in September 2015 in [place].  Underwent nuclear stress test in October 2015 which was normal.  He reports that he has been having dyspnea with mild exertion.  Denies any chest pain.  States that he plays golf and when he walks up hills will feel short of breath.  Walking up stairs also causes shortness of breath.  In addition he reports pain in his thighs with exertion.  States that this also occurs with walking up hills or up stairs.  He reports his BP has been up to the 150s over 90s at home.  He is enrolled in a program with [name] where his BPs are automatically sent to their office, and they have been adjusting his medications.  He has not been able to tolerate statins, Zetia, or Repatha.  States that he developed significant leg weakness with all these medications.  LDL 115 on 11/02/2019, though that was on Zetia.  He also has arthritis in his shoulder and knee, takes a total of 800 mg of ibuprofen daily.  No smoking history.  Father had CVA in 72s and CABG in 29s.  Recent labwork on 02/25/2020 showed BNP 12, ESR 4, creatinine 1.1, sodium 131, potassium 3.9, albumin 4.8, WBCs 3.7, hemoglobin 12.9.    Past Medical History:  Diagnosis Date  . Acne rosacea   . Arthritis   . BPH (benign prostatic hypertrophy)   . Clavicle fracture    left  . Detached retina    left   . Fx ankle    left  . Hyperlipidemia   . Hypertension   .  Hypothyroidism   . OSA (obstructive sleep apnea)    mild  . Seasonal allergies     Past Surgical History:  Procedure Laterality Date  . CATARACT EXTRACTION     left  . detached retiina     left  . EYE SURGERY     lasix  . HERNIA REPAIR    . TONSILLECTOMY      Current Medications: Current Meds  Medication Sig  . acetaminophen (TYLENOL) 500 MG tablet Take 2 tablets (1,000 mg total) by mouth every 6 (six) hours.  Marland Kitchen amLODipine (NORVASC) 5 MG tablet Take 5 mg by mouth 2 (two) times daily.   Marland Kitchen aspirin EC 81 MG tablet Take 81 mg by mouth daily.   . benazepril (LOTENSIN) 20 MG tablet Take 20 mg by mouth 2 (two) times daily.   . benzonatate (TESSALON) 200 MG capsule Take 200 mg by mouth 3 (three) times daily as needed for cough.  . celecoxib (CELEBREX) 100 MG capsule Take 1 capsule (100 mg total) by mouth 2 (two) times daily.  . chlorthalidone (HYGROTON) 25 MG tablet Take 25 mg by mouth daily.   . cyclobenzaprine (FLEXERIL) 10 MG tablet Take 10 mg by mouth 3 (three) times daily as needed for muscle spasms.   . diclofenac sodium (VOLTAREN)  1 % GEL APPLY TOPICALLY TO AFFECTED AREA ONCE DAILY AS NEEDED FOR PAIN  . fexofenadine (ALLEGRA) 180 MG tablet Take 180 mg by mouth daily.   . fluticasone (FLONASE) 50 MCG/ACT nasal spray Place 1 spray into both nostrils 2 (two) times daily.   Marland Kitchen KLOR-CON M10 10 MEQ tablet Take 10 mEq by mouth 2 (two) times daily.  Marland Kitchen levothyroxine (SYNTHROID, LEVOTHROID) 112 MCG tablet Take 112 mcg by mouth at bedtime.   Marland Kitchen REPATHA SURECLICK 373 MG/ML SOAJ Inject 140 mg into the skin every 14 (fourteen) days.  . tadalafil (CIALIS) 5 MG tablet Take 5 mg by mouth daily as needed (AS DIRECTED).   . tamsulosin (FLOMAX) 0.4 MG CAPS capsule Take 0.4 mg by mouth daily.  Marland Kitchen telmisartan (MICARDIS) 80 MG tablet Take 80 mg by mouth daily.     Allergies:   Statins   Social History   Socioeconomic History  . Marital status: Married    Spouse name: Not on file  . Number of  children: Not on file  . Years of education: Not on file  . Highest education level: Not on file  Occupational History  . Not on file  Tobacco Use  . Smoking status: Never Smoker  . Smokeless tobacco: Never Used  Substance and Sexual Activity  . Alcohol use: Yes    Alcohol/week: 6.0 - 10.0 standard drinks    Types: 6 - 10 Standard drinks or equivalent per week  . Drug use: Never  . Sexual activity: Not on file  Other Topics Concern  . Not on file  Social History Narrative  . Not on file   Social Determinants of Health   Financial Resource Strain:   . Difficulty of Paying Living Expenses:   Food Insecurity:   . Worried About Charity fundraiser in the Last Year:   . Arboriculturist in the Last Year:   Transportation Needs:   . Film/video editor (Medical):   Marland Kitchen Lack of Transportation (Non-Medical):   Physical Activity:   . Days of Exercise per Week:   . Minutes of Exercise per Session:   Stress:   . Feeling of Stress :   Social Connections:   . Frequency of Communication with Friends and Family:   . Frequency of Social Gatherings with Friends and Family:   . Attends Religious Services:   . Active Member of Clubs or Organizations:   . Attends Archivist Meetings:   Marland Kitchen Marital Status:      Family History: The patient's family history includes CAD in his father and mother; Hypertension in his brother and brother.  ROS:   Please see the history of present illness.     All other systems reviewed and are negative.  EKGs/Labs/Other Studies Reviewed:    The following studies were reviewed today:   EKG:  EKG is ordered today.  The ekg ordered today demonstrates normal sinus rhythm, rate 67, no ST/T abnormality  Recent Labs: No results found for requested labs within last 8760 hours.  Recent Lipid Panel No results found for: CHOL, TRIG, HDL, CHOLHDL, VLDL, LDLCALC, LDLDIRECT  Physical Exam:    VS:  BP (!) 142/78   Pulse 67   Ht 5' 11" (1.803 m)   Wt  229 lb 9.6 oz (104.1 kg)   BMI 32.02 kg/m     Wt Readings from Last 3 Encounters:  03/31/20 229 lb 9.6 oz (104.1 kg)  05/01/19 240 lb (108.9 kg)  02/09/19  240 lb (108.9 kg)     GEN:  in no acute distress HEENT: Normal NECK: No JVD; No carotid bruits CARDIAC: RRR, no murmurs, rubs, gallops RESPIRATORY:  Clear to auscultation without rales, wheezing or rhonchi  ABDOMEN: Soft, non-tender, non-distended MUSCULOSKELETAL:  No edema; No deformity  SKIN: Warm and dry NEUROLOGIC:  Alert and oriented x 3 PSYCHIATRIC:  Normal affect   ASSESSMENT:    1. DOE (dyspnea on exertion)   2. Elevated coronary artery calcium score   3. Claudication ([place])   4. Hyperlipidemia, unspecified hyperlipidemia type   5. Essential hypertension    PLAN:     Dyspnea on exertion: Given known CAD with calcium score 645 in 2015, dyspnea could represent anginal equivalent. -Lexiscan Myoview to evaluate for ischemia -TTE to evaluate for structural heart disease  CAD: Calcium score 645 in 2015.  Has been unable to tolerate statin, Repatha, or Zetia.  Will refer to lipid clinic for evaluation.  He is taking significant NSAIDs doses for his shoulder pain.  Recommend Tylenol instead of NSAIDs given his CAD.  Leg pain: Description suggests claudication, will check ABIs  Hyperlipidemia: Has been unable to tolerate statins, Repatha, and Zetia.   LDL 115 on 11/02/2019, though that was on Zetia.  Goal LDL less than 70 given CAD history with calcium score 645 in 2015. -Refer to lipid clinic for further evaluation  Hypertension: On amlodipine 5 mg daily, telmisartan 80 mg daily, chlorthalidone 25 mg daily.  BP mildly elevated in clinic today.  Follows with [name], reports has monitor where measurements are automatically sent to his office  OSA: reports compliance with CPAP  RTC in 1 month   Medication Adjustments/Labs and Tests Ordered: Current medicines are reviewed at length with the patient today.   Concerns regarding medicines are outlined above.  Orders Placed This Encounter  Procedures  . AMB Referral to [facility]  . MYOCARDIAL PERFUSION IMAGING  . EKG 12-Lead  . ECHOCARDIOGRAM COMPLETE  . VAS [place] ABI WITH/WO TBI  . VAS [place] LOWER EXTREMITY ARTERIAL DUPLEX   No orders of the defined types were placed in this encounter.   Patient Instructions  Medication Instructions:  Your physician recommends that you continue on your current medications as directed. Please refer to the Current Medication list given to you today.  Testing/Procedures: Your physician has requested that you have a lexiscan myoview. For further information please visit [email or web address]. Please follow instruction sheet, as given.  Your physician has requested that you have an echocardiogram. Echocardiography is a painless test that uses sound waves to create images of your heart. It provides your doctor with information about the size and shape of your heart and how well your heart's chambers and valves are working. This procedure takes approximately one hour. There are no restrictions for this procedure. This will be done at our [facility] location:  [place] has requested that you have an ankle brachial index (ABI). During this test an ultrasound and blood pressure cuff are used to evaluate the arteries that supply the arms and legs with blood. Allow thirty minutes for this exam. There are no restrictions or special instructions.   Follow-Up: At [facility], you and your health needs are our priority.  As part of our continuing mission to provide you with exceptional heart care, we have created designated Provider Care Teams.  These Care Teams include your primary Cardiologist (physician) and Advanced Practice Providers (APPs -  Physician Assistants and Nurse Practitioners) who all work together to provide you with the care you need, when you need  it.  We recommend signing up for the patient portal called "MyChart".  Sign up information is provided on this After Visit Summary.  MyChart is used to connect with patients for Virtual Visits (Telemedicine).  Patients are able to view lab/test results, encounter notes, upcoming appointments, etc.  Non-urgent messages can be sent to your provider as well.   To learn more about what you can do with MyChart, go to [email or web address].    Your next appointment:   1 month(s)  The format for your next appointment:   In Person  Provider:   Oswaldo Milian, MD   Other Instructions You have been referred to Lipid clinic with our pharmacist      Signed, [name], MD  03/31/2020 9:22 AM    [place]

## 2020-03-31 NOTE — Patient Instructions (Signed)
Medication Instructions:  Your physician recommends that you continue on your current medications as directed. Please refer to the Current Medication list given to you today.  Testing/Procedures: Your physician has requested that you have a lexiscan myoview. For further information please visit [email or web address]. Please follow instruction sheet, as given.  Your physician has requested that you have an echocardiogram. Echocardiography is a painless test that uses sound waves to create images of your heart. It provides your doctor with information about the size and shape of your heart and how well your heart's chambers and valves are working. This procedure takes approximately one hour. There are no restrictions for this procedure. This will be done at our [facility] location:  [place] has requested that you have an ankle brachial index (ABI). During this test an ultrasound and blood pressure cuff are used to evaluate the arteries that supply the arms and legs with blood. Allow thirty minutes for this exam. There are no restrictions or special instructions.   Follow-Up: At [facility], you and your health needs are our priority.  As part of our continuing mission to provide you with exceptional heart care, we have created designated Provider Care Teams.  These Care Teams include your primary Cardiologist (physician) and Advanced Practice Providers (APPs -  Physician Assistants and Nurse Practitioners) who all work together to provide you with the care you need, when you need it.  We recommend signing up for the patient portal called "MyChart".  Sign up information is provided on this After Visit Summary.  MyChart is used to connect with patients for Virtual Visits (Telemedicine).  Patients are able to view lab/test results, encounter notes, upcoming appointments, etc.  Non-urgent messages can be sent to your provider as well.   To learn more about what  you can do with MyChart, go to [email or web address].    Your next appointment:   1 month(s)  The format for your next appointment:   In Person  Provider:   Oswaldo Milian, MD   Other Instructions You have been referred to Lipid clinic with our pharmacist

## 2020-04-03 ENCOUNTER — Telehealth (HOSPITAL_COMMUNITY): Payer: Self-pay

## 2020-04-03 NOTE — Telephone Encounter (Signed)
Encounter complete. 

## 2020-04-07 ENCOUNTER — Other Ambulatory Visit: Payer: Self-pay

## 2020-04-07 ENCOUNTER — Encounter: Payer: Self-pay | Admitting: Family Medicine

## 2020-04-07 ENCOUNTER — Ambulatory Visit (HOSPITAL_COMMUNITY)
Admission: RE | Admit: 2020-04-07 | Discharge: 2020-04-07 | Disposition: A | Discharge status: Home / Self Care | Payer: Medicare Other | Source: Ambulatory Visit | Attending: Cardiology | Admitting: Cardiology

## 2020-04-07 DIAGNOSIS — R0600 Dyspnea, unspecified: Secondary | ICD-10-CM

## 2020-04-07 DIAGNOSIS — R0609 Other forms of dyspnea: Secondary | ICD-10-CM

## 2020-04-07 DIAGNOSIS — R931 Abnormal findings on diagnostic imaging of heart and coronary circulation: Secondary | ICD-10-CM | POA: Diagnosis not present

## 2020-04-07 LAB — MYOCARDIAL PERFUSION IMAGING
LV dias vol: 110 mL (ref 62–150)
LV sys vol: 46 mL
Peak HR: 90 {beats}/min
Rest HR: 64 {beats}/min
SDS: 2
SRS: 2
SSS: 4
TID: 0.97

## 2020-04-07 MED ORDER — TECHNETIUM TC 99M TETROFOSMIN IV KIT
10.5000 | PACK | Freq: Once | INTRAVENOUS | Status: AC | PRN
  Administered 2020-04-07: 10.5 via INTRAVENOUS
  Filled 2020-04-07: qty 11

## 2020-04-07 MED ORDER — TECHNETIUM TC 99M TETROFOSMIN IV KIT
31.2000 | PACK | Freq: Once | INTRAVENOUS | Status: AC | PRN
  Administered 2020-04-07: 31.2 via INTRAVENOUS
  Filled 2020-04-07: qty 32

## 2020-04-07 MED ORDER — REGADENOSON 0.4 MG/5ML IV SOLN
0.4000 mg | Freq: Once | INTRAVENOUS | Status: AC
  Administered 2020-04-07: 0.4 mg via INTRAVENOUS

## 2020-04-10 ENCOUNTER — Ambulatory Visit (HOSPITAL_COMMUNITY)
Admission: RE | Admit: 2020-04-10 | Discharge: 2020-04-10 | Disposition: A | Discharge status: Home / Self Care | Payer: Medicare Other | Source: Ambulatory Visit | Attending: Cardiovascular Disease | Admitting: Cardiovascular Disease

## 2020-04-10 ENCOUNTER — Ambulatory Visit (INDEPENDENT_AMBULATORY_CARE_PROVIDER_SITE_OTHER): Payer: Medicare Other | Admitting: Pharmacist

## 2020-04-10 ENCOUNTER — Other Ambulatory Visit: Payer: Self-pay

## 2020-04-10 DIAGNOSIS — R931 Abnormal findings on diagnostic imaging of heart and coronary circulation: Secondary | ICD-10-CM | POA: Diagnosis not present

## 2020-04-10 DIAGNOSIS — E785 Hyperlipidemia, unspecified: Secondary | ICD-10-CM | POA: Diagnosis not present

## 2020-04-10 DIAGNOSIS — I739 Peripheral vascular disease, unspecified: Secondary | ICD-10-CM | POA: Diagnosis not present

## 2020-04-10 NOTE — Patient Instructions (Addendum)
It was a pleasure seeing you today!  We will start you on a medication called Praluent which is similar to [place] but we will start you on the lowest dose (75 mg every 14 days) and see if you can tolerate it. Please call us if you have any symptoms of muscle weakness and we can discuss Nexletol and Nexlizet.   Call us at [phone] with any questions or concerns  Pharmacists -Roseanne Reno and Gerald Stabs

## 2020-04-10 NOTE — Progress Notes (Signed)
Patient ID: Rick Turner                 DOB: 1951-08-18                    MRN: [id number]     HPI: Rick Turner is a 69 y.o. male patient referred to lipid clinic by [name]. PMH is significant for CAD, hypertension, hyperlipidemia, hypothyroidism, OSA, dyspnea on exertion. History of elevated calcium score, was 645 in September 2015 in [place]. Underwent nuclear stress test in October 2015 which was normal. Repeat stress test on 04/07/20 was normal as well.  Scheduled for Lower extremity ultrasounds today to evaluate claudication. ECHO scheduled for 04/28/20.   He was first initiated on statin therapy in the 1990s and was on simvastatin for 15 years which he stated that he tolerated with no issues. He was switched to atorvastatin in 2014 and his leg weakness began around 2015. Around that time, he tried several different statin including atorvastatin, rosuvastatin, and other statin which he cannot rememeber the name of. He was unable to tolerate any of them. He stopped taking statins for about 3 months and noted about a 60% improvement in his leg weakness. Next, he tried Repatha for several months but he noted that his myopathy was worse than with statins. He has also tried ezetimibe for several years as well but was still having muscle weakness. He has been off Repatha for about 1 year now and stopped ezetimibe about 2-3 months ago. He notes that he is still having some muscle weakness despite being off medications but notices an improvement from when he is taking cholesterol medications.    Member ID: [id number] BIN: [id number]  DOB: 1950/12/02  Group ID: [id number] PCN: [id number]  Legal sex: M  Group name: Rick Turner PDP [id number] [place] VALUE SCRIPT       Current Medications: None Intolerances: statins (atorvastatin, rosuvastatin), ezetimibe, Repatha; severe leg pain/weakness with all Risk Factors: Elevated calcium score 645 from 2015 LDL goal: <70  Family History: CAD in his father and  mother, hypertension in his brothers, and 39 year old son has been diagnosed with apical hypertrophic cardiomyopathy  Social History: No smoking history  Labs: LDL 115 on 11/02/2019 but he was taking ezetimibe at the time   Past Medical History:  Diagnosis Date  . Acne rosacea   . Arthritis   . BPH (benign prostatic hypertrophy)   . Clavicle fracture    left  . Detached retina    left   . Fx ankle    left  . Hyperlipidemia   . Hypertension   . Hypothyroidism   . OSA (obstructive sleep apnea)    mild  . Seasonal allergies     Current Outpatient Medications on File Prior to Visit  Medication Sig Dispense Refill  . acetaminophen (TYLENOL) 500 MG tablet Take 2 tablets (1,000 mg total) by mouth every 6 (six) hours. 50 tablet 0  . amLODipine (NORVASC) 5 MG tablet Take 5 mg by mouth 2 (two) times daily.     Marland Kitchen aspirin EC 81 MG tablet Take 81 mg by mouth daily.     . benazepril (LOTENSIN) 20 MG tablet Take 20 mg by mouth 2 (two) times daily.     . benzonatate (TESSALON) 200 MG capsule Take 200 mg by mouth 3 (three) times daily as needed for cough.    . celecoxib (CELEBREX) 100 MG capsule Take 1 capsule (100 mg total) by mouth 2 (two) times  daily. 60 capsule 0  . chlorthalidone (HYGROTON) 25 MG tablet Take 25 mg by mouth daily.     . cyclobenzaprine (FLEXERIL) 10 MG tablet Take 10 mg by mouth 3 (three) times daily as needed for muscle spasms.   1  . diclofenac sodium (VOLTAREN) 1 % GEL APPLY TOPICALLY TO AFFECTED AREA ONCE DAILY AS NEEDED FOR PAIN 2 g 1  . fexofenadine (ALLEGRA) 180 MG tablet Take 180 mg by mouth daily.     . fluticasone (FLONASE) 50 MCG/ACT nasal spray Place 1 spray into both nostrils 2 (two) times daily.     Marland Kitchen KLOR-CON M10 10 MEQ tablet Take 10 mEq by mouth 2 (two) times daily.  0  . levothyroxine (SYNTHROID, LEVOTHROID) 112 MCG tablet Take 112 mcg by mouth at bedtime.     Marland Kitchen REPATHA SURECLICK 242 MG/ML SOAJ Inject 140 mg into the skin every 14 (fourteen) days.    .  tadalafil (CIALIS) 5 MG tablet Take 5 mg by mouth daily as needed (AS DIRECTED).     . tamsulosin (FLOMAX) 0.4 MG CAPS capsule Take 0.4 mg by mouth daily.    Marland Kitchen telmisartan (MICARDIS) 80 MG tablet Take 80 mg by mouth daily.     No current facility-administered medications on file prior to visit.    Allergies  Allergen Reactions  . Statins Other (See Comments)    Pt reports muscle weakness when taking statins    Assessment/Plan: 1. CAD - Calcium score 645 in 2015  - Unable to tolerate statins, Repatha, or ezetimibe  - Patient willing to try lower dose Praluent. Initiate Praluent 75 mg every 14 days. Will work on prior authorization. Instructed patient to call if he notices any worsening in leg pain/weakness. He may be able to get [employer] as well  2. HLD (LDL goal < 70) - LDL 115 on 11/02/2019, though that was on ezetimibe  - Unable to tolerate statins, Repatha, or ezetimibe  - Initiate Praluent as above - Discussed Nexletol and Nexlizet as options if he is unable to tolerate lower dose of Praluent    Kennon Holter, PharmD PGY1 Ambulatory Care Pharmacy Resident

## 2020-04-13 ENCOUNTER — Encounter: Payer: Self-pay | Admitting: Pharmacist

## 2020-04-13 ENCOUNTER — Ambulatory Visit: Payer: Medicare Other | Admitting: Family Medicine

## 2020-04-13 ENCOUNTER — Ambulatory Visit: Payer: Self-pay

## 2020-04-13 ENCOUNTER — Encounter: Payer: Self-pay | Admitting: Family Medicine

## 2020-04-13 ENCOUNTER — Telehealth: Payer: Self-pay | Admitting: Pharmacist

## 2020-04-13 ENCOUNTER — Other Ambulatory Visit: Payer: Self-pay

## 2020-04-13 DIAGNOSIS — G8929 Other chronic pain: Secondary | ICD-10-CM

## 2020-04-13 DIAGNOSIS — M25512 Pain in left shoulder: Secondary | ICD-10-CM

## 2020-04-13 MED ORDER — PRALUENT 75 MG/ML ~~LOC~~ SOAJ: 1.0000 "pen " | SUBCUTANEOUS | 11 refills | Status: DC

## 2020-04-13 NOTE — Telephone Encounter (Signed)
Spoke with patient- will apply for [employer] grant. He will call back with information

## 2020-04-13 NOTE — Telephone Encounter (Signed)
Praluent 75mg  pen approved until further notice. Called [facility] about cost Initial fill will be 431.52 due to deductible then her has to pay 49% of cost which is about 232.38 per month. Could apply for healthwell grant, but will probably run out of money 1-2 months before grant can be renewed. Patient could also look into plan with better coverage for next year and grant could cover entire cost. LVM for patient to call back Per pharmacy- will not fill until he tells them to.

## 2020-04-13 NOTE — Progress Notes (Signed)
Subjective: Patient is here for ultrasound-guided intra-articular left glenohumeral injection.  We are trying PRP.  In addition, he has noticed improvement doing hanging exercises on a regular basis.  His range of motion is better as well as his pain.  He is able to function, able to play golf.  Objective: He has good overhead reach.  He is still limited with behind the back reach.  Procedure: Ultrasound-guided left glenohumeral injection: After sterile prep with Betadine, injected 5 cc of PRP using a 22-gauge spinal needle, passing the needle from posterior approach into the glenohumeral joint.  Injectate was seen filling the joint capsule.

## 2020-04-14 NOTE — Telephone Encounter (Signed)
Patient left his income information on voicemail. Unfortunately, his income is above the cut off for [employer]well as well as for [employer]. I have submitted a tier exception to his insurance company to see if we can get the cost down. Patient aware and is appreciative of the help. Nexletol and Nexlezet are non-formulary-therefore we will run into the same issue.

## 2020-04-15 NOTE — Telephone Encounter (Signed)
Tier exception approved. Praluent lowered to tier 3. Patient made aware. He will have pharmacy re-run and will call if there is any issues.Will call pt in 2 months to set up labs.

## 2020-04-28 ENCOUNTER — Ambulatory Visit (HOSPITAL_COMMUNITY): Payer: Medicare Other | Attending: Cardiology

## 2020-04-28 ENCOUNTER — Other Ambulatory Visit: Payer: Self-pay

## 2020-04-28 DIAGNOSIS — R0600 Dyspnea, unspecified: Secondary | ICD-10-CM | POA: Insufficient documentation

## 2020-04-28 DIAGNOSIS — R0609 Other forms of dyspnea: Secondary | ICD-10-CM

## 2020-04-30 ENCOUNTER — Encounter: Payer: Self-pay | Admitting: Orthopedic Surgery

## 2020-04-30 ENCOUNTER — Ambulatory Visit (INDEPENDENT_AMBULATORY_CARE_PROVIDER_SITE_OTHER): Payer: Medicare Other | Admitting: Orthopedic Surgery

## 2020-04-30 ENCOUNTER — Telehealth: Payer: Self-pay

## 2020-04-30 DIAGNOSIS — M1712 Unilateral primary osteoarthritis, left knee: Secondary | ICD-10-CM

## 2020-04-30 DIAGNOSIS — R931 Abnormal findings on diagnostic imaging of heart and coronary circulation: Secondary | ICD-10-CM | POA: Diagnosis not present

## 2020-04-30 MED ORDER — BUPIVACAINE HCL 0.25 % IJ SOLN
4.0000 mL | INTRAMUSCULAR | Status: AC | PRN
  Administered 2020-04-30: 4 mL via INTRA_ARTICULAR

## 2020-04-30 MED ORDER — LIDOCAINE HCL 1 % IJ SOLN
5.0000 mL | INTRAMUSCULAR | Status: AC | PRN
  Administered 2020-04-30: 5 mL

## 2020-04-30 MED ORDER — METHYLPREDNISOLONE ACETATE 40 MG/ML IJ SUSP
40.0000 mg | INTRAMUSCULAR | Status: AC | PRN
  Administered 2020-04-30: 40 mg via INTRA_ARTICULAR

## 2020-04-30 NOTE — Telephone Encounter (Signed)
Can we please get auth for left knee gel injection? 

## 2020-04-30 NOTE — Progress Notes (Signed)
Office Visit Note   Patient: Rick Turner           Date of Birth: 03-May-1951           MRN: [id number] Visit Date: 04/30/2020 Requested by: Rick Orn, MD [place] [facility] [address],  [place] [address] PCP: Rick Orn, MD  Subjective: Chief Complaint  Patient presents with  . Left Knee - Pain    HPI: Rick Turner is a 69 y.o. male who presents to the office complaining of left knee pain.  His left knee has been starting to bother him more in the last several weeks.  He had a gel injection in February that provided significant relief.  He notes that gel injections last longer than cortisone injections.  He denies any recent injury, groin pain, radicular pain.  He wants to have a cortisone injection as he is leaving for [place] for 1 month in early July.  He will do a lot of hiking..                ROS:  All systems reviewed are negative as they relate to the chief complaint within the history of present illness.  Patient denies fevers or chills.  Assessment & Plan: Visit Diagnoses:  1. Arthritis of left knee     Plan: Patient is a 69 year old male who presents complaining of left knee pain.  His last injection was in February of this year.  He is leaving soon for a trip to [place] to see his daughter.  Plan for cortisone injection today.  Patient tolerated the procedure well.  Follow-up as needed.  We will also preapproved him for gel injections today so that he will be ready when he can get the next gel injection around August or September.  This patient is diagnosed with osteoarthritis of the knee(s).    Radiographs show evidence of joint space narrowing, osteophytes, subchondral sclerosis and/or subchondral cysts.  This patient has knee pain which interferes with functional and activities of daily living.    This patient has experienced inadequate response, adverse effects and/or intolerance with conservative treatments such as acetaminophen, NSAIDS, topical  creams, physical therapy or regular exercise, knee bracing and/or weight loss.   This patient has experienced inadequate response or has a contraindication to intra articular steroid injections for at least 3 months.   This patient is not scheduled to have a total knee replacement within 6 months of starting treatment with viscosupplementation.   Follow-Up Instructions: No follow-ups on file.   Orders:  No orders of the defined types were placed in this encounter.  No orders of the defined types were placed in this encounter.     Procedures: Large Joint Inj: L knee on 04/30/2020 10:16 AM Indications: diagnostic evaluation, joint swelling and pain Details: 18 G 1.5 in needle, superolateral approach  Arthrogram: No  Medications: 5 mL lidocaine 1 %; 40 mg methylPREDNISolone acetate 40 MG/ML; 4 mL bupivacaine 0.25 % Outcome: tolerated well, no immediate complications Procedure, treatment alternatives, risks and benefits explained, specific risks discussed. Consent was given by the patient. Immediately prior to procedure a time out was called to verify the correct patient, procedure, equipment, support staff and site/side marked as required. Patient was prepped and draped in the usual sterile fashion.       Clinical Data: No additional findings.  Objective: Vital Signs: There were no vitals taken for this visit.  Physical Exam:  Constitutional: Patient appears well-developed HEENT:  Head: Normocephalic Eyes:EOM  are normal Neck: Normal range of motion Cardiovascular: Normal rate Pulmonary/chest: Effort normal Neurologic: Patient is alert Skin: Skin is warm Psychiatric: Patient has normal mood and affect  Ortho Exam:  Left knee Exam No effusion Extensor mechanism intact Mild tenderness to palpation over the medial joint line No TTP over the lateral jointlines, quad tendon, patellar tendon, pes anserinus, patella, tibial tubercle, LCL/MCL insertions Stable to  varus/valgus stresses.  Stable to anterior/posterior drawer Extension to 0 degrees Flexion > 90 degrees  Specialty Comments:  No specialty comments available.  Imaging: No results found.   PMFS History: Patient Active Problem List   Diagnosis Date Noted  . Hyperlipidemia 04/10/2020  . Rib fracture 02/09/2019  . 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 Dry eye syndrome 08/20/2013  . Nuclear sclerotic cataract 08/20/2013  . Pseudoaphakia 08/20/2013  . Detached retina 08/20/2013   Past Medical History:  Diagnosis Date  . Acne rosacea   . Arthritis   . BPH (benign prostatic hypertrophy)   . Clavicle fracture    left  . Detached retina    left   . Fx ankle    left  . Hyperlipidemia   . Hypertension   . Hypothyroidism   . OSA (obstructive sleep apnea)    mild  . Seasonal allergies     Family History  Problem Relation Age of Onset  . CAD Mother   . CAD Father   . Hypertension Brother   . Hypertension Brother     Past Surgical History:  Procedure Laterality Date  . CATARACT EXTRACTION     left  . detached retiina     left  . EYE SURGERY     lasix  . HERNIA REPAIR    . TONSILLECTOMY     Social History   Occupational History  . Not on file  Tobacco Use  . Smoking status: Never Smoker  . Smokeless tobacco: Never Used  Substance and Sexual Activity  . Alcohol use: Yes    Alcohol/week: 6.0 - 10.0 standard drinks    Types: 6 - 10 Standard drinks or equivalent per week  . Drug use: Never  . Sexual activity: Not on file

## 2020-04-30 NOTE — Telephone Encounter (Signed)
Noted  

## 2020-05-05 DIAGNOSIS — I10 Essential (primary) hypertension: Secondary | ICD-10-CM | POA: Diagnosis not present

## 2020-05-07 NOTE — Progress Notes (Signed)
Cardiology Office Note:    Date:  05/08/2020   ID:  Rick Turner, DOB April 30, 1951, MRN [id number]  PCP:  [name], MD  Cardiologist:  No primary care provider on file.  Electrophysiologist:  None   Referring MD: [name], MD   No chief complaint on file.   History of Present Illness:    Rick Turner is a 69 y.o. male with a hx of CAD, hypertension, hyperlipidemia, hypothyroidism, OSA who presents for follow-up.  He was referred by [name] for evaluation of dyspnea on exertion, initially seen on 03/31/2020.  He has a history of elevated calcium score, was 645 in September 2015 in [place].  Underwent nuclear stress test in October 2015 which was normal.  He reports that he has been having dyspnea with mild exertion.  Denies any chest pain.  States that he plays golf and when he walks up hills will feel short of breath.  Walking up stairs also causes shortness of breath.  In addition he reports pain in his thighs with exertion.  States that this also occurs with walking up hills or up stairs.  He reports his BP has been up to the 150s over 90s at home.  He is enrolled in a program with [name] where his BPs are automatically sent to their office, and they have been adjusting his medications.  He has not been able to tolerate statins, Zetia, or Repatha.  States that he developed significant leg weakness with all these medications.  LDL 115 on 11/02/2019, though that was on Zetia.  He also has arthritis in his shoulder and knee, takes a total of 800 mg of ibuprofen daily.  No smoking history.  Father had CVA in 81s and CABG in 30s.  Mother had atrial fibrillation.  His son was recently diagnosed with apical hypertrophic cardiomyopathy.  Labwork on 02/25/2020 showed BNP 12, ESR 4, creatinine 1.1, sodium 131, potassium 3.9, albumin 4.8, WBCs 3.7, hemoglobin 12.9.  Lexiscan Myoview was done on 04/07/2020, which showed normal perfusion, EF 58%.  Normal ABIs on 04/10/20.   Echocardiogram on 04/28/2020 showed biventricular function, grade 1 diastolic dysfunction, mild dilatation of the aortic root measuring 38 mm.  Following initial clinic visit, he was referred to lipid clinic and started on Praluent on 04/10/2020.  Since last clinic visit, he reports that he continues to have dyspnea and lightheadedness.  He denies any chest pain.  He has started Praluent, has had 1 injection so far without any issues.  Reports recently made changes to his BP meds, now taking amlodipine and chlorthalidone at night.  Reports BP has been better controlled, has been 120s over 70s at home.   Past Medical History:  Diagnosis Date  . Acne rosacea   . Arthritis   . BPH (benign prostatic hypertrophy)   . Clavicle fracture    left  . Detached retina    left   . Fx ankle    left  . Hyperlipidemia   . Hypertension   . Hypothyroidism   . OSA (obstructive sleep apnea)    mild  . Seasonal allergies     Past Surgical History:  Procedure Laterality Date  . CATARACT EXTRACTION     left  . detached retiina     left  . EYE SURGERY     lasix  . HERNIA REPAIR    . TONSILLECTOMY      Current Medications: Current Meds  Medication Sig  . acetaminophen (TYLENOL) 500 MG tablet Take 2  tablets (1,000 mg total) by mouth every 6 (six) hours. (Patient taking differently: Take 1,000 mg by mouth 2 (two) times daily as needed. )  . Alirocumab (PRALUENT) 75 MG/ML SOAJ Inject 1 pen into the skin every 14 (fourteen) days.  Marland Kitchen amLODipine (NORVASC) 10 MG tablet Take 10 mg by mouth daily.  Marland Kitchen aspirin EC 81 MG tablet Take 81 mg by mouth daily.   . chlorthalidone (HYGROTON) 25 MG tablet Take 25 mg by mouth daily.   . cyclobenzaprine (FLEXERIL) 10 MG tablet Take 10 mg by mouth 3 (three) times daily as needed for muscle spasms.   . diclofenac sodium (VOLTAREN) 1 % GEL APPLY TOPICALLY TO AFFECTED AREA ONCE DAILY AS NEEDED FOR PAIN  . fexofenadine (ALLEGRA) 180 MG tablet Take 180 mg by mouth daily.   .  fluticasone (FLONASE) 50 MCG/ACT nasal spray Place 1 spray into both nostrils 2 (two) times daily.   Marland Kitchen KLOR-CON M10 10 MEQ tablet Take 10 mEq by mouth 2 (two) times daily.  Marland Kitchen levothyroxine (SYNTHROID) 125 MCG tablet Take 125 mcg by mouth every morning.  Marland Kitchen levothyroxine (SYNTHROID, LEVOTHROID) 112 MCG tablet Take 125 mcg by mouth at bedtime.   Marland Kitchen omeprazole (PRILOSEC) 20 MG capsule Take 20 mg by mouth daily.  . tadalafil (CIALIS) 5 MG tablet Take 5 mg by mouth daily as needed (AS DIRECTED).   . tamsulosin (FLOMAX) 0.4 MG CAPS capsule Take 0.4 mg by mouth daily.  Marland Kitchen telmisartan (MICARDIS) 80 MG tablet Take 80 mg by mouth daily.     Allergies:   Statins   Social History   Socioeconomic History  . Marital status: Married    Spouse name: Not on file  . Number of children: Not on file  . Years of education: Not on file  . Highest education level: Not on file  Occupational History  . Not on file  Tobacco Use  . Smoking status: Never Smoker  . Smokeless tobacco: Never Used  Substance and Sexual Activity  . Alcohol use: Yes    Alcohol/week: 6.0 - 10.0 standard drinks    Types: 6 - 10 Standard drinks or equivalent per week  . Drug use: Never  . Sexual activity: Not on file  Other Topics Concern  . Not on file  Social History Narrative  . Not on file   Social Determinants of Health   Financial Resource Strain:   . Difficulty of Paying Living Expenses:   Food Insecurity:   . Worried About Charity fundraiser in the Last Year:   . Arboriculturist in the Last Year:   Transportation Needs:   . Film/video editor (Medical):   Marland Kitchen Lack of Transportation (Non-Medical):   Physical Activity:   . Days of Exercise per Week:   . Minutes of Exercise per Session:   Stress:   . Feeling of Stress :   Social Connections:   . Frequency of Communication with Friends and Family:   . Frequency of Social Gatherings with Friends and Family:   . Attends Religious Services:   . Active Member of  Clubs or Organizations:   . Attends Archivist Meetings:   Marland Kitchen Marital Status:      Family History: The patient's family history includes CAD in his father and mother; Hypertension in his brother and brother.  ROS:   Please see the history of present illness.     All other systems reviewed and are negative.  EKGs/Labs/Other Studies Reviewed:  The following studies were reviewed today:   EKG:  EKG is not ordered today.  The ekg ordered 03/31/20 demonstrates normal sinus rhythm, rate 67, no ST/T abnormality  Recent Labs: No results found for requested labs within last 8760 hours.  Recent Lipid Panel No results found for: CHOL, TRIG, HDL, CHOLHDL, VLDL, LDLCALC, LDLDIRECT  Physical Exam:    VS:  BP 130/78   Pulse 68   Ht '5\' 11"'$  (1.803 m)   Wt 227 lb 12.8 oz (103.3 kg)   SpO2 98%   BMI 31.77 kg/m     Wt Readings from Last 3 Encounters:  05/08/20 227 lb 12.8 oz (103.3 kg)  04/07/20 229 lb (103.9 kg)  03/31/20 229 lb 9.6 oz (104.1 kg)     GEN:  in no acute distress HEENT: Normal NECK: No JVD; No carotid bruits CARDIAC: RRR, no murmurs, rubs, gallops RESPIRATORY:  Clear to auscultation without rales, wheezing or rhonchi  ABDOMEN: Soft, non-tender, non-distended MUSCULOSKELETAL:  No edema; No deformity  SKIN: Warm and dry NEUROLOGIC:  Alert and oriented x 3 PSYCHIATRIC:  Normal affect   ASSESSMENT:    1. DOE (dyspnea on exertion)   2. Elevated coronary artery calcium score   3. Pain in both lower extremities   4. Hyperlipidemia, unspecified hyperlipidemia type   5. Essential hypertension   6. Family history of hypertrophic cardiomyopathy    PLAN:     Dyspnea on exertion: Given known CAD with calcium score 645 in 2015, dyspnea could represent anginal equivalent.  Lexiscan Myoview was done on 04/07/2020, which showed normal perfusion, EF 58%.  Echocardiogram on 04/28/2020 showed biventricular function, grade 1 diastolic dysfunction, mild dilatation of the  aortic root measuring 38 mm.    CAD: Calcium score 645 in 2015.  Has been unable to tolerate statin, Repatha, or Zetia.  Referred to lipid clinic and started on Praluent on 04/10/2020.  Leg pain: Normal ABIs on 04/10/20  Hyperlipidemia: Has been unable to tolerate statins, Repatha, and Zetia.   LDL 115 on 11/02/2019, though that was on Zetia.  Goal LDL less than 70 given CAD history with calcium score 645 in 2015.  Referred to lipid clinic and started on Praluent on 04/10/2020.  Hypertension: On amlodipine 10 mg daily, telmisartan 80 mg daily, chlorthalidone 25 mg daily.  Follows with [name], reports has monitor where measurements are automatically sent to his office.  Appears controlled.  Family history of HCM: Son was recently diagnosed with apical hypertrophic cardiomyopathy.  No evidence of HCM on recent TTE.  Discussed that his son's first degree relatives should be screened  OSA: reports compliance with CPAP  RTC in 6 months   Medication Adjustments/Labs and Tests Ordered: Current medicines are reviewed at length with the patient today.  Concerns regarding medicines are outlined above.  No orders of the defined types were placed in this encounter.  No orders of the defined types were placed in this encounter.   Patient Instructions  Medication Instructions:  Your physician recommends that you continue on your current medications as directed. Please refer to the Current Medication list given to you today.  *If you need a refill on your cardiac medications before your next appointment, please call your pharmacy*  Follow-Up: At [facility], you and your health needs are our priority.  As part of our continuing mission to provide you with exceptional heart care, we have created designated Provider Care Teams.  These Care Teams include your primary Cardiologist (physician) and Advanced Practice  Providers (APPs -  Physician Assistants and Nurse Practitioners) who all work together to  provide you with the care you need, when you need it.  We recommend signing up for the patient portal called "MyChart".  Sign up information is provided on this After Visit Summary.  MyChart is used to connect with patients for Virtual Visits (Telemedicine).  Patients are able to view lab/test results, encounter notes, upcoming appointments, etc.  Non-urgent messages can be sent to your provider as well.   To learn more about what you can do with MyChart, go to [email or web address].    Your next appointment:   6 month(s)  The format for your next appointment:   In Person  Provider:   Oswaldo Milian, MD        Signed, [name], MD  05/08/2020 8:35 AM    [place]

## 2020-05-08 ENCOUNTER — Encounter: Payer: Self-pay | Admitting: Cardiology

## 2020-05-08 ENCOUNTER — Ambulatory Visit (INDEPENDENT_AMBULATORY_CARE_PROVIDER_SITE_OTHER): Payer: Medicare Other | Admitting: Cardiology

## 2020-05-08 ENCOUNTER — Other Ambulatory Visit: Payer: Self-pay

## 2020-05-08 VITALS — BP 130/78 | HR 68 | Ht 71.0 in | Wt 227.8 lb

## 2020-05-08 DIAGNOSIS — R931 Abnormal findings on diagnostic imaging of heart and coronary circulation: Secondary | ICD-10-CM

## 2020-05-08 DIAGNOSIS — E785 Hyperlipidemia, unspecified: Secondary | ICD-10-CM | POA: Diagnosis not present

## 2020-05-08 DIAGNOSIS — R0609 Other forms of dyspnea: Secondary | ICD-10-CM

## 2020-05-08 DIAGNOSIS — M79604 Pain in right leg: Secondary | ICD-10-CM | POA: Diagnosis not present

## 2020-05-08 DIAGNOSIS — R0600 Dyspnea, unspecified: Secondary | ICD-10-CM

## 2020-05-08 DIAGNOSIS — M79605 Pain in left leg: Secondary | ICD-10-CM

## 2020-05-08 DIAGNOSIS — Z8249 Family history of ischemic heart disease and other diseases of the circulatory system: Secondary | ICD-10-CM

## 2020-05-08 DIAGNOSIS — I10 Essential (primary) hypertension: Secondary | ICD-10-CM

## 2020-05-08 NOTE — Patient Instructions (Signed)

## 2020-05-13 ENCOUNTER — Telehealth: Payer: Self-pay

## 2020-05-13 NOTE — Telephone Encounter (Signed)
Submitted VOB, SynviscOne, left knee. 

## 2020-05-15 ENCOUNTER — Telehealth: Payer: Self-pay

## 2020-05-15 NOTE — Telephone Encounter (Signed)
Patient is aware that he is approved for gel injection.  Approved, SynviscOne, left Knee. Buy & Bill Covered at 100% through his secondary insurance(BC[employer]) after Medicare pays No Co-pay No PA required

## 2020-06-04 DIAGNOSIS — I10 Essential (primary) hypertension: Secondary | ICD-10-CM | POA: Diagnosis not present

## 2020-06-05 DIAGNOSIS — I10 Essential (primary) hypertension: Secondary | ICD-10-CM | POA: Diagnosis not present

## 2020-06-16 ENCOUNTER — Ambulatory Visit (INDEPENDENT_AMBULATORY_CARE_PROVIDER_SITE_OTHER): Payer: Medicare Other | Admitting: Family Medicine

## 2020-06-16 ENCOUNTER — Other Ambulatory Visit: Payer: Self-pay

## 2020-06-16 ENCOUNTER — Ambulatory Visit: Payer: Self-pay

## 2020-06-16 ENCOUNTER — Encounter: Payer: Self-pay | Admitting: Family Medicine

## 2020-06-16 DIAGNOSIS — G8929 Other chronic pain: Secondary | ICD-10-CM | POA: Diagnosis not present

## 2020-06-16 DIAGNOSIS — M25512 Pain in left shoulder: Secondary | ICD-10-CM | POA: Diagnosis not present

## 2020-06-16 DIAGNOSIS — M7502 Adhesive capsulitis of left shoulder: Secondary | ICD-10-CM

## 2020-06-16 NOTE — Progress Notes (Signed)
Office Visit Note   Patient: Rick Turner           Date of Birth: Jul 20, 1951           MRN: [id number] Visit Date: 06/16/2020 Requested by: [name], MD [address] [place],  [place] [address] PCP: [name], MD  Subjective: Chief Complaint  Patient presents with  . Left Shoulder - Pain    Would like to try the prolotherapy injection today. No "significant improvement" post PRP injection.    HPI: He is here with persistent left shoulder pain due to glenohumeral DJD.  PRP unfortunately did not help him.  He would like to try dextrose prolotherapy before considering surgery.              ROS:   All other systems were reviewed and are negative.  Objective: Vital Signs: There were no vitals taken for this visit.  Physical Exam:  General:  Alert and oriented, in no acute distress. Pulm:  Breathing unlabored. Psy:  Normal mood, congruent affect.  Decreased ROM of left shoulder with overhead reach.  Imaging: US Guided Needle Placement - No Linked Charges  Result Date: 06/16/2020 Ultrasound-guided left glenohumeral injection: After sterile prep with Betadine, injected 6 cc 1% lidocaine without epinephrine and 4 cc 50% dextrose using a 22-gauge spinal needle, passing the needle through approach into the glenohumeral joint.  Injectate seen filling joint capsule.  Can repeat in 2-3 weeks if needed.     Assessment & Plan: 1.  Left glenohumeral DJD -Dextrose prolotherapy injection as above.  We can repeat in 2 to 3 weeks if this seems to help.  If he gets no relief, he may want to consider surgical intervention per Dr. Marlou Sa.     Procedures: No procedures performed  No notes on file     PMFS History: Patient Active Problem List   Diagnosis Date Noted  . Hyperlipidemia 04/10/2020  . Rib fracture 02/09/2019  . 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 Dry eye syndrome 08/20/2013  . Nuclear sclerotic cataract 08/20/2013  .  Pseudoaphakia 08/20/2013  . Detached retina 08/20/2013   Past Medical History:  Diagnosis Date  . Acne rosacea   . Arthritis   . BPH (benign prostatic hypertrophy)   . Clavicle fracture    left  . Detached retina    left   . Fx ankle    left  . Hyperlipidemia   . Hypertension   . Hypothyroidism   . OSA (obstructive sleep apnea)    mild  . Seasonal allergies     Family History  Problem Relation Age of Onset  . CAD Mother   . CAD Father   . Hypertension Brother   . Hypertension Brother     Past Surgical History:  Procedure Laterality Date  . CATARACT EXTRACTION     left  . detached retiina     left  . EYE SURGERY     lasix  . HERNIA REPAIR    . TONSILLECTOMY     Social History   Occupational History  . Not on file  Tobacco Use  . Smoking status: Never Smoker  . Smokeless tobacco: Never Used  Substance and Sexual Activity  . Alcohol use: Yes    Alcohol/week: 6.0 - 10.0 standard drinks    Types: 6 - 10 Standard drinks or equivalent per week  . Drug use: Never  . Sexual activity: Not on file

## 2020-06-17 ENCOUNTER — Encounter: Payer: Self-pay | Admitting: Orthopedic Surgery

## 2020-06-17 ENCOUNTER — Ambulatory Visit (INDEPENDENT_AMBULATORY_CARE_PROVIDER_SITE_OTHER): Payer: Medicare Other | Admitting: Orthopedic Surgery

## 2020-06-17 DIAGNOSIS — M1712 Unilateral primary osteoarthritis, left knee: Secondary | ICD-10-CM | POA: Diagnosis not present

## 2020-06-17 MED ORDER — LIDOCAINE HCL 1 % IJ SOLN
5.0000 mL | INTRAMUSCULAR | Status: AC | PRN
  Administered 2020-06-17: 5 mL

## 2020-06-17 MED ORDER — HYLAN G-F 20 48 MG/6ML IX SOSY
48.0000 mg | PREFILLED_SYRINGE | INTRA_ARTICULAR | Status: AC | PRN
  Administered 2020-06-17: 48 mg via INTRA_ARTICULAR

## 2020-06-17 NOTE — Progress Notes (Signed)
   Procedure Note  Patient: Rick Turner             Date of Birth: Jul 18, 1951           MRN: [id number]             Visit Date: 06/17/2020  Procedures: Visit Diagnoses:  1. Arthritis of left knee     Large Joint Inj on 06/17/2020 10:10 PM Indications: pain, joint swelling and diagnostic evaluation Details: 18 G 1.5 in needle, superolateral approach  Arthrogram: No  Medications: 5 mL lidocaine 1 %; 48 mg Hylan 48 MG/6ML Outcome: tolerated well, no immediate complications Procedure, treatment alternatives, risks and benefits explained, specific risks discussed. Consent was given by the patient. Immediately prior to procedure a time out was called to verify the correct patient, procedure, equipment, support staff and site/side marked as required. Patient was prepped and draped in the usual sterile fashion.

## 2020-06-18 ENCOUNTER — Telehealth: Payer: Self-pay

## 2020-06-18 DIAGNOSIS — E785 Hyperlipidemia, unspecified: Secondary | ICD-10-CM

## 2020-06-18 NOTE — Addendum Note (Signed)
Addended by: Marcelle Overlie D on: 06/18/2020 09:01 AM   Modules accepted: Orders

## 2020-06-18 NOTE — Addendum Note (Signed)
Addended by: Marcelle Overlie D on: 06/18/2020 09:03 AM   Modules accepted: Orders

## 2020-06-18 NOTE — Telephone Encounter (Signed)
-----   Message from Ramond Dial, [place] sent at 06/18/2020  7:19 AM EDT -----  ----- Message ----- From: Ramond Dial, RPH-CPP Sent: 06/17/2020 To: Ramond Dial, RPH-CPP  Set up labs ----- Message ----- From: Ramond Dial, RPH-CPP Sent: 04/10/2020  12:53 PM EDT To: Ramond Dial, RPH-CPP  PA for praluent 75

## 2020-06-26 ENCOUNTER — Other Ambulatory Visit: Payer: Medicare Other | Admitting: *Deleted

## 2020-06-26 ENCOUNTER — Telehealth: Payer: Self-pay

## 2020-06-26 ENCOUNTER — Other Ambulatory Visit: Payer: Self-pay

## 2020-06-26 DIAGNOSIS — E039 Hypothyroidism, unspecified: Secondary | ICD-10-CM | POA: Diagnosis not present

## 2020-06-26 DIAGNOSIS — E7800 Pure hypercholesterolemia, unspecified: Secondary | ICD-10-CM | POA: Diagnosis not present

## 2020-06-26 DIAGNOSIS — I10 Essential (primary) hypertension: Secondary | ICD-10-CM | POA: Diagnosis not present

## 2020-06-26 DIAGNOSIS — M1712 Unilateral primary osteoarthritis, left knee: Secondary | ICD-10-CM | POA: Diagnosis not present

## 2020-06-26 DIAGNOSIS — N400 Enlarged prostate without lower urinary tract symptoms: Secondary | ICD-10-CM | POA: Diagnosis not present

## 2020-06-26 DIAGNOSIS — E785 Hyperlipidemia, unspecified: Secondary | ICD-10-CM | POA: Diagnosis not present

## 2020-06-26 DIAGNOSIS — I2510 Atherosclerotic heart disease of native coronary artery without angina pectoris: Secondary | ICD-10-CM | POA: Diagnosis not present

## 2020-06-26 LAB — HEPATIC FUNCTION PANEL
ALT: 16 IU/L (ref 0–44)
AST: 23 IU/L (ref 0–40)
Albumin: 4.6 g/dL (ref 3.8–4.8)
Alkaline Phosphatase: 77 IU/L (ref 48–121)
Bilirubin Total: 0.4 mg/dL (ref 0.0–1.2)
Bilirubin, Direct: 0.13 mg/dL (ref 0.00–0.40)
Total Protein: 6.8 g/dL (ref 6.0–8.5)

## 2020-06-26 LAB — LIPID PANEL
Chol/HDL Ratio: 2.6 ratio (ref 0.0–5.0)
Cholesterol, Total: 150 mg/dL (ref 100–199)
HDL: 58 mg/dL (ref >39)
LDL Chol Calc (NIH): 79 mg/dL (ref 0–99)
Triglycerides: 65 mg/dL (ref 0–149)
VLDL Cholesterol Cal: 13 mg/dL (ref 5–40)

## 2020-06-26 NOTE — Telephone Encounter (Signed)
Labs

## 2020-07-21 DIAGNOSIS — Z23 Encounter for immunization: Secondary | ICD-10-CM | POA: Diagnosis not present

## 2020-07-31 DIAGNOSIS — M19012 Primary osteoarthritis, left shoulder: Secondary | ICD-10-CM | POA: Diagnosis not present

## 2020-07-31 DIAGNOSIS — M75112 Incomplete rotator cuff tear or rupture of left shoulder, not specified as traumatic: Secondary | ICD-10-CM | POA: Diagnosis not present

## 2020-07-31 DIAGNOSIS — M25512 Pain in left shoulder: Secondary | ICD-10-CM | POA: Diagnosis not present

## 2020-08-06 DIAGNOSIS — I10 Essential (primary) hypertension: Secondary | ICD-10-CM | POA: Diagnosis not present

## 2020-09-09 DIAGNOSIS — R972 Elevated prostate specific antigen [PSA]: Secondary | ICD-10-CM | POA: Diagnosis not present

## 2020-09-14 DIAGNOSIS — Z23 Encounter for immunization: Secondary | ICD-10-CM | POA: Diagnosis not present

## 2020-09-15 DIAGNOSIS — N5201 Erectile dysfunction due to arterial insufficiency: Secondary | ICD-10-CM | POA: Diagnosis not present

## 2020-09-15 DIAGNOSIS — R972 Elevated prostate specific antigen [PSA]: Secondary | ICD-10-CM | POA: Diagnosis not present

## 2020-09-17 DIAGNOSIS — M25819 Other specified joint disorders, unspecified shoulder: Secondary | ICD-10-CM | POA: Diagnosis not present

## 2020-09-17 DIAGNOSIS — I10 Essential (primary) hypertension: Secondary | ICD-10-CM | POA: Diagnosis not present

## 2020-09-17 DIAGNOSIS — E7800 Pure hypercholesterolemia, unspecified: Secondary | ICD-10-CM | POA: Diagnosis not present

## 2020-09-17 DIAGNOSIS — Z01818 Encounter for other preprocedural examination: Secondary | ICD-10-CM | POA: Diagnosis not present

## 2020-09-17 DIAGNOSIS — R7301 Impaired fasting glucose: Secondary | ICD-10-CM | POA: Diagnosis not present

## 2020-09-23 DIAGNOSIS — Z9849 Cataract extraction status, unspecified eye: Secondary | ICD-10-CM | POA: Diagnosis not present

## 2020-09-23 DIAGNOSIS — Z961 Presence of intraocular lens: Secondary | ICD-10-CM | POA: Diagnosis not present

## 2020-09-23 DIAGNOSIS — H52223 Regular astigmatism, bilateral: Secondary | ICD-10-CM | POA: Diagnosis not present

## 2020-09-23 DIAGNOSIS — H5212 Myopia, left eye: Secondary | ICD-10-CM | POA: Diagnosis not present

## 2020-09-29 DIAGNOSIS — Z20822 Contact with and (suspected) exposure to covid-19: Secondary | ICD-10-CM | POA: Diagnosis not present

## 2020-10-02 DIAGNOSIS — I10 Essential (primary) hypertension: Secondary | ICD-10-CM | POA: Diagnosis not present

## 2020-10-05 DIAGNOSIS — E7800 Pure hypercholesterolemia, unspecified: Secondary | ICD-10-CM | POA: Diagnosis not present

## 2020-10-05 DIAGNOSIS — Z888 Allergy status to other drugs, medicaments and biological substances status: Secondary | ICD-10-CM | POA: Diagnosis not present

## 2020-10-05 DIAGNOSIS — M7989 Other specified soft tissue disorders: Secondary | ICD-10-CM | POA: Diagnosis not present

## 2020-10-05 DIAGNOSIS — I10 Essential (primary) hypertension: Secondary | ICD-10-CM | POA: Diagnosis not present

## 2020-10-05 DIAGNOSIS — M1712 Unilateral primary osteoarthritis, left knee: Secondary | ICD-10-CM | POA: Diagnosis not present

## 2020-10-05 DIAGNOSIS — G4730 Sleep apnea, unspecified: Secondary | ICD-10-CM | POA: Diagnosis not present

## 2020-10-05 DIAGNOSIS — E038 Other specified hypothyroidism: Secondary | ICD-10-CM | POA: Diagnosis not present

## 2020-10-05 DIAGNOSIS — K219 Gastro-esophageal reflux disease without esophagitis: Secondary | ICD-10-CM | POA: Diagnosis not present

## 2020-10-05 DIAGNOSIS — Z96612 Presence of left artificial shoulder joint: Secondary | ICD-10-CM | POA: Diagnosis not present

## 2020-10-05 DIAGNOSIS — M75122 Complete rotator cuff tear or rupture of left shoulder, not specified as traumatic: Secondary | ICD-10-CM | POA: Diagnosis not present

## 2020-10-05 DIAGNOSIS — M7522 Bicipital tendinitis, left shoulder: Secondary | ICD-10-CM | POA: Diagnosis not present

## 2020-10-05 DIAGNOSIS — M19012 Primary osteoarthritis, left shoulder: Secondary | ICD-10-CM | POA: Diagnosis not present

## 2020-10-05 DIAGNOSIS — Z79899 Other long term (current) drug therapy: Secondary | ICD-10-CM | POA: Diagnosis not present

## 2020-10-05 DIAGNOSIS — G8918 Other acute postprocedural pain: Secondary | ICD-10-CM | POA: Diagnosis not present

## 2020-10-05 DIAGNOSIS — M25512 Pain in left shoulder: Secondary | ICD-10-CM | POA: Diagnosis not present

## 2020-10-06 DIAGNOSIS — I10 Essential (primary) hypertension: Secondary | ICD-10-CM | POA: Diagnosis not present

## 2020-10-08 ENCOUNTER — Ambulatory Visit: Payer: Medicare Other | Admitting: Cardiology

## 2020-10-14 ENCOUNTER — Other Ambulatory Visit: Payer: Self-pay | Admitting: Orthopedic Surgery

## 2020-10-14 ENCOUNTER — Ambulatory Visit
Admission: RE | Admit: 2020-10-14 | Discharge: 2020-10-14 | Disposition: A | Discharge status: Home / Self Care | Payer: Medicare Other | Source: Ambulatory Visit | Attending: Orthopedic Surgery | Admitting: Orthopedic Surgery

## 2020-10-14 ENCOUNTER — Other Ambulatory Visit: Payer: Self-pay

## 2020-10-14 DIAGNOSIS — Z96612 Presence of left artificial shoulder joint: Secondary | ICD-10-CM | POA: Diagnosis not present

## 2020-10-14 DIAGNOSIS — Z471 Aftercare following joint replacement surgery: Secondary | ICD-10-CM | POA: Diagnosis not present

## 2020-10-20 DIAGNOSIS — M19012 Primary osteoarthritis, left shoulder: Secondary | ICD-10-CM | POA: Diagnosis not present

## 2020-10-23 DIAGNOSIS — M25612 Stiffness of left shoulder, not elsewhere classified: Secondary | ICD-10-CM | POA: Diagnosis not present

## 2020-10-23 DIAGNOSIS — Z96612 Presence of left artificial shoulder joint: Secondary | ICD-10-CM | POA: Diagnosis not present

## 2020-10-26 DIAGNOSIS — Z96612 Presence of left artificial shoulder joint: Secondary | ICD-10-CM | POA: Diagnosis not present

## 2020-10-26 DIAGNOSIS — M25612 Stiffness of left shoulder, not elsewhere classified: Secondary | ICD-10-CM | POA: Diagnosis not present

## 2020-10-29 DIAGNOSIS — Z96612 Presence of left artificial shoulder joint: Secondary | ICD-10-CM | POA: Diagnosis not present

## 2020-10-29 DIAGNOSIS — M25612 Stiffness of left shoulder, not elsewhere classified: Secondary | ICD-10-CM | POA: Diagnosis not present

## 2020-11-03 DIAGNOSIS — Z96612 Presence of left artificial shoulder joint: Secondary | ICD-10-CM | POA: Diagnosis not present

## 2020-11-03 DIAGNOSIS — M25612 Stiffness of left shoulder, not elsewhere classified: Secondary | ICD-10-CM | POA: Diagnosis not present

## 2020-11-05 DIAGNOSIS — Z96612 Presence of left artificial shoulder joint: Secondary | ICD-10-CM | POA: Diagnosis not present

## 2020-11-05 DIAGNOSIS — M25612 Stiffness of left shoulder, not elsewhere classified: Secondary | ICD-10-CM | POA: Diagnosis not present

## 2020-11-09 DIAGNOSIS — M25612 Stiffness of left shoulder, not elsewhere classified: Secondary | ICD-10-CM | POA: Diagnosis not present

## 2020-11-09 DIAGNOSIS — Z96612 Presence of left artificial shoulder joint: Secondary | ICD-10-CM | POA: Diagnosis not present

## 2020-11-16 DIAGNOSIS — M25612 Stiffness of left shoulder, not elsewhere classified: Secondary | ICD-10-CM | POA: Diagnosis not present

## 2020-11-16 DIAGNOSIS — Z96612 Presence of left artificial shoulder joint: Secondary | ICD-10-CM | POA: Diagnosis not present

## 2020-11-18 DIAGNOSIS — Z96612 Presence of left artificial shoulder joint: Secondary | ICD-10-CM | POA: Diagnosis not present

## 2020-11-18 DIAGNOSIS — M25612 Stiffness of left shoulder, not elsewhere classified: Secondary | ICD-10-CM | POA: Diagnosis not present

## 2020-11-25 DIAGNOSIS — Z96612 Presence of left artificial shoulder joint: Secondary | ICD-10-CM | POA: Diagnosis not present

## 2020-11-25 DIAGNOSIS — M25612 Stiffness of left shoulder, not elsewhere classified: Secondary | ICD-10-CM | POA: Diagnosis not present

## 2020-11-26 ENCOUNTER — Other Ambulatory Visit: Payer: Self-pay

## 2020-11-26 ENCOUNTER — Other Ambulatory Visit: Payer: Self-pay | Admitting: Orthopedic Surgery

## 2020-11-26 ENCOUNTER — Ambulatory Visit
Admission: RE | Admit: 2020-11-26 | Discharge: 2020-11-26 | Disposition: A | Discharge status: Home / Self Care | Payer: Medicare Other | Source: Ambulatory Visit | Attending: Orthopedic Surgery | Admitting: Orthopedic Surgery

## 2020-11-26 DIAGNOSIS — Z96612 Presence of left artificial shoulder joint: Secondary | ICD-10-CM

## 2020-11-26 DIAGNOSIS — Z471 Aftercare following joint replacement surgery: Secondary | ICD-10-CM | POA: Diagnosis not present

## 2020-11-30 DIAGNOSIS — Z96612 Presence of left artificial shoulder joint: Secondary | ICD-10-CM | POA: Diagnosis not present

## 2020-11-30 DIAGNOSIS — M25612 Stiffness of left shoulder, not elsewhere classified: Secondary | ICD-10-CM | POA: Diagnosis not present

## 2020-12-01 DIAGNOSIS — M19012 Primary osteoarthritis, left shoulder: Secondary | ICD-10-CM | POA: Diagnosis not present

## 2020-12-02 DIAGNOSIS — M25612 Stiffness of left shoulder, not elsewhere classified: Secondary | ICD-10-CM | POA: Diagnosis not present

## 2020-12-02 DIAGNOSIS — Z96612 Presence of left artificial shoulder joint: Secondary | ICD-10-CM | POA: Diagnosis not present

## 2020-12-07 DIAGNOSIS — Z96612 Presence of left artificial shoulder joint: Secondary | ICD-10-CM | POA: Diagnosis not present

## 2020-12-07 DIAGNOSIS — M25612 Stiffness of left shoulder, not elsewhere classified: Secondary | ICD-10-CM | POA: Diagnosis not present

## 2020-12-09 DIAGNOSIS — M25612 Stiffness of left shoulder, not elsewhere classified: Secondary | ICD-10-CM | POA: Diagnosis not present

## 2020-12-09 DIAGNOSIS — Z96612 Presence of left artificial shoulder joint: Secondary | ICD-10-CM | POA: Diagnosis not present

## 2020-12-14 DIAGNOSIS — M25612 Stiffness of left shoulder, not elsewhere classified: Secondary | ICD-10-CM | POA: Diagnosis not present

## 2020-12-14 DIAGNOSIS — Z96612 Presence of left artificial shoulder joint: Secondary | ICD-10-CM | POA: Diagnosis not present

## 2020-12-16 DIAGNOSIS — Z96612 Presence of left artificial shoulder joint: Secondary | ICD-10-CM | POA: Diagnosis not present

## 2020-12-16 DIAGNOSIS — M25612 Stiffness of left shoulder, not elsewhere classified: Secondary | ICD-10-CM | POA: Diagnosis not present

## 2020-12-18 DIAGNOSIS — K219 Gastro-esophageal reflux disease without esophagitis: Secondary | ICD-10-CM | POA: Diagnosis not present

## 2020-12-18 DIAGNOSIS — Z0000 Encounter for general adult medical examination without abnormal findings: Secondary | ICD-10-CM | POA: Diagnosis not present

## 2020-12-18 DIAGNOSIS — Z789 Other specified health status: Secondary | ICD-10-CM | POA: Diagnosis not present

## 2020-12-18 DIAGNOSIS — R7301 Impaired fasting glucose: Secondary | ICD-10-CM | POA: Diagnosis not present

## 2020-12-18 DIAGNOSIS — Z1389 Encounter for screening for other disorder: Secondary | ICD-10-CM | POA: Diagnosis not present

## 2020-12-18 DIAGNOSIS — Z6835 Body mass index (BMI) 35.0-35.9, adult: Secondary | ICD-10-CM | POA: Diagnosis not present

## 2020-12-18 DIAGNOSIS — E538 Deficiency of other specified B group vitamins: Secondary | ICD-10-CM | POA: Diagnosis not present

## 2020-12-18 DIAGNOSIS — I10 Essential (primary) hypertension: Secondary | ICD-10-CM | POA: Diagnosis not present

## 2020-12-18 DIAGNOSIS — G4733 Obstructive sleep apnea (adult) (pediatric): Secondary | ICD-10-CM | POA: Diagnosis not present

## 2020-12-18 DIAGNOSIS — E039 Hypothyroidism, unspecified: Secondary | ICD-10-CM | POA: Diagnosis not present

## 2020-12-18 DIAGNOSIS — E7800 Pure hypercholesterolemia, unspecified: Secondary | ICD-10-CM | POA: Diagnosis not present

## 2020-12-18 DIAGNOSIS — Z1211 Encounter for screening for malignant neoplasm of colon: Secondary | ICD-10-CM | POA: Diagnosis not present

## 2020-12-18 DIAGNOSIS — N400 Enlarged prostate without lower urinary tract symptoms: Secondary | ICD-10-CM | POA: Diagnosis not present

## 2020-12-22 ENCOUNTER — Encounter (INDEPENDENT_AMBULATORY_CARE_PROVIDER_SITE_OTHER): Payer: Medicare Other | Admitting: Ophthalmology

## 2020-12-22 ENCOUNTER — Other Ambulatory Visit: Payer: Self-pay

## 2020-12-22 DIAGNOSIS — H338 Other retinal detachments: Secondary | ICD-10-CM | POA: Diagnosis not present

## 2020-12-22 DIAGNOSIS — H35372 Puckering of macula, left eye: Secondary | ICD-10-CM | POA: Diagnosis not present

## 2020-12-22 DIAGNOSIS — I10 Essential (primary) hypertension: Secondary | ICD-10-CM

## 2020-12-22 DIAGNOSIS — H35033 Hypertensive retinopathy, bilateral: Secondary | ICD-10-CM

## 2020-12-22 DIAGNOSIS — H43811 Vitreous degeneration, right eye: Secondary | ICD-10-CM

## 2021-01-01 ENCOUNTER — Telehealth: Payer: Self-pay | Admitting: Orthopedic Surgery

## 2021-01-01 NOTE — Telephone Encounter (Signed)
Noted  

## 2021-01-01 NOTE — Telephone Encounter (Signed)
Can we get auth for gel injection?

## 2021-01-01 NOTE — Telephone Encounter (Signed)
Patient called advised he would like to get a left knee gel injection. Patient also said he got a cortisone injection 10/05/2021 in his left knee when he was out of town. The number to contact patient is [phone]

## 2021-01-03 DIAGNOSIS — I10 Essential (primary) hypertension: Secondary | ICD-10-CM | POA: Diagnosis not present

## 2021-01-05 DIAGNOSIS — E7800 Pure hypercholesterolemia, unspecified: Secondary | ICD-10-CM | POA: Diagnosis not present

## 2021-01-05 DIAGNOSIS — E039 Hypothyroidism, unspecified: Secondary | ICD-10-CM | POA: Diagnosis not present

## 2021-01-05 DIAGNOSIS — M1712 Unilateral primary osteoarthritis, left knee: Secondary | ICD-10-CM | POA: Diagnosis not present

## 2021-01-05 DIAGNOSIS — K219 Gastro-esophageal reflux disease without esophagitis: Secondary | ICD-10-CM | POA: Diagnosis not present

## 2021-01-05 DIAGNOSIS — I10 Essential (primary) hypertension: Secondary | ICD-10-CM | POA: Diagnosis not present

## 2021-01-05 DIAGNOSIS — I2510 Atherosclerotic heart disease of native coronary artery without angina pectoris: Secondary | ICD-10-CM | POA: Diagnosis not present

## 2021-01-05 DIAGNOSIS — N400 Enlarged prostate without lower urinary tract symptoms: Secondary | ICD-10-CM | POA: Diagnosis not present

## 2021-01-07 ENCOUNTER — Telehealth: Payer: Self-pay

## 2021-01-07 NOTE — Progress Notes (Signed)
Cardiology Office Note:    Date:  01/08/2021   ID:  Rick Turner, DOB Apr 06, 1951, MRN [id number]  PCP:  [name], MD  Cardiologist:  No primary care provider on file.  Electrophysiologist:  None   Referring MD: [name], MD   Chief Complaint  Patient presents with  . Coronary Artery Disease    History of Present Illness:    Rick Turner is a 70 y.o. male with a hx of CAD, hypertension, hyperlipidemia, hypothyroidism, OSA who presents for follow-up.  He was referred by [name] for evaluation of dyspnea on exertion, initially seen on 03/31/2020.  He has a history of elevated calcium score, was 645 in September 2015 in [place].  Underwent nuclear stress test in October 2015 which was normal.  He reports that he has been having dyspnea with mild exertion.  Denies any chest pain.  States that he plays golf and when he walks up hills will feel short of breath.  Walking up stairs also causes shortness of breath.  In addition he reports pain in his thighs with exertion.  States that this also occurs with walking up hills or up stairs.  He reports his BP has been up to the 150s over 90s at home.  He is enrolled in a program with [name] where his BPs are automatically sent to their office, and they have been adjusting his medications.  He has not been able to tolerate statins, Zetia, or Repatha.  States that he developed significant leg weakness with all these medications.  LDL 115 on 11/02/2019, though that was on Zetia.  He also has arthritis in his shoulder and knee, takes a total of 800 mg of ibuprofen daily.  No smoking history.  Father had CVA in 47s and CABG in 34s.  Mother had atrial fibrillation.  His son was recently diagnosed with apical hypertrophic cardiomyopathy.  Labwork on 02/25/2020 showed BNP 12, ESR 4, creatinine 1.1, sodium 131, potassium 3.9, albumin 4.8, WBCs 3.7, hemoglobin 12.9.  Lexiscan Myoview was done on 04/07/2020, which showed normal perfusion, EF  58%.  Normal ABIs on 04/10/20.  Echocardiogram on 04/28/2020 showed biventricular function, grade 1 diastolic dysfunction, mild dilatation of the aortic root measuring 38 mm.  Following initial clinic visit, he was referred to lipid clinic and started on Praluent on 04/10/2020.  Since last clinic visit, he reports that he has been doing well.  He continues to have some leg pain, particular with going up stairs.  He denies any chest pain, dyspnea, lightheadedness, syncope, palpitations, or lower extremity edema.  Had recent labs at his PCP, LDL was 55 on 12/18/2020.  He decreased his Praluent dose to every 3 weeks as felt that it was contributing to his leg pain.  Reports compliance with his CPAP.   Past Medical History:  Diagnosis Date  . Acne rosacea   . Arthritis   . BPH (benign prostatic hypertrophy)   . Clavicle fracture    left  . Detached retina    left   . Fx ankle    left  . Hyperlipidemia   . Hypertension   . Hypothyroidism   . OSA (obstructive sleep apnea)    mild  . Seasonal allergies     Past Surgical History:  Procedure Laterality Date  . CATARACT EXTRACTION     left  . detached retiina     left  . EYE SURGERY     lasix  . HERNIA REPAIR    . TONSILLECTOMY  Current Medications: Current Meds  Medication Sig  . acetaminophen (TYLENOL) 500 MG tablet Take 2 tablets (1,000 mg total) by mouth every 6 (six) hours. (Patient taking differently: Take 1,000 mg by mouth 2 (two) times daily as needed.)  . Alirocumab (PRALUENT) 75 MG/ML SOAJ Inject 1 pen into the skin every 14 (fourteen) days.  Marland Kitchen amLODipine (NORVASC) 10 MG tablet Take 10 mg by mouth daily.  Marland Kitchen amoxicillin (AMOXIL) 500 MG capsule TAKE 4 CAPSULES 1 HOUR PRIOR TO DENTAL WORK  . aspirin EC 81 MG tablet Take 81 mg by mouth daily.   . carvedilol (COREG) 6.25 MG tablet 1 tablet with food  . chlorthalidone (HYGROTON) 25 MG tablet Take 25 mg by mouth daily.   . Cholecalciferol (VITAMIN D3) 50 MCG (2000 UT) capsule 1  capsule  . Cyanocobalamin (VITAMIN B12) 1000 MCG TBCR 1 tablet  . diclofenac sodium (VOLTAREN) 1 % GEL APPLY TOPICALLY TO AFFECTED AREA ONCE DAILY AS NEEDED FOR PAIN  . fexofenadine (ALLEGRA) 180 MG tablet Take 180 mg by mouth daily.   . fluticasone (FLONASE) 50 MCG/ACT nasal spray Place 1 spray into both nostrils 2 (two) times daily.   Marland Kitchen levothyroxine (SYNTHROID) 125 MCG tablet Take 125 mcg by mouth every morning.  . Multiple Vitamin (MULTI VITAMIN) TABS 1 tablet  . omeprazole (PRILOSEC) 20 MG capsule Take 20 mg by mouth daily.  . potassium chloride (MICRO-K) 10 MEQ CR capsule Take by mouth.  . tadalafil (CIALIS) 5 MG tablet Take 5 mg by mouth daily as needed (AS DIRECTED).   . tamsulosin (FLOMAX) 0.4 MG CAPS capsule Take 0.4 mg by mouth daily.  Marland Kitchen telmisartan (MICARDIS) 80 MG tablet Take 80 mg by mouth daily.     Allergies:   Evolocumab, Hydrocod polst-cpm polst er, Hydrocodone-acetaminophen, and Statins   Social History   Socioeconomic History  . Marital status: Married    Spouse name: Not on file  . Number of children: Not on file  . Years of education: Not on file  . Highest education level: Not on file  Occupational History  . Not on file  Tobacco Use  . Smoking status: Never Smoker  . Smokeless tobacco: Never Used  Substance and Sexual Activity  . Alcohol use: Yes    Alcohol/week: 6.0 - 10.0 standard drinks    Types: 6 - 10 Standard drinks or equivalent per week  . Drug use: Never  . Sexual activity: Not on file  Other Topics Concern  . Not on file  Social History Narrative  . Not on file   Social Determinants of Health   Financial Resource Strain: Not on file  Food Insecurity: Not on file  Transportation Needs: Not on file  Physical Activity: Not on file  Stress: Not on file  Social Connections: Not on file     Family History: The patient's family history includes CAD in his father and mother; Hypertension in his brother and brother.  ROS:   Please see the  history of present illness.     All other systems reviewed and are negative.  EKGs/Labs/Other Studies Reviewed:    The following studies were reviewed today:   EKG:  EKG is ordered today.  The ekg ordered  demonstrates normal sinus rhythm, rate 63, no ST/T abnormality  Recent Labs: 06/26/2020: ALT 16  Recent Lipid Panel    Component Value Date/Time   CHOL 150 06/26/2020 0821   TRIG 65 06/26/2020 0821   HDL 58 06/26/2020 0821   CHOLHDL 2.6  06/26/2020 0821   LDLCALC 79 06/26/2020 0821    Physical Exam:    VS:  BP 132/74   Pulse 63   Ht _0  (1.803 m)   Wt 244 lb (110.7 kg)   BMI 34.03 kg/m     Wt Readings from Last 3 Encounters:  01/08/21 244 lb (110.7 kg)  05/08/20 227 lb 12.8 oz (103.3 kg)  04/07/20 229 lb (103.9 kg)     GEN:  in no acute distress HEENT: Normal NECK: No JVD; No carotid bruits CARDIAC: RRR, no murmurs, rubs, gallops RESPIRATORY:  Clear to auscultation without rales, wheezing or rhonchi  ABDOMEN: Soft, non-tender, non-distended MUSCULOSKELETAL:  No edema; No deformity  SKIN: Warm and dry NEUROLOGIC:  Alert and oriented x 3 PSYCHIATRIC:  Normal affect   ASSESSMENT:    1. CAD in native artery   2. DOE (dyspnea on exertion)   3. Pain in both lower extremities   4. Essential hypertension   5. Hyperlipidemia, unspecified hyperlipidemia type   6. Family history of hypertrophic cardiomyopathy    PLAN:     Dyspnea on exertion: Given known CAD with calcium score 645 in 2015, dyspnea could represent anginal equivalent.  Lexiscan Myoview was done on 04/07/2020, which showed normal perfusion, EF 58%.  Echocardiogram on 04/28/2020 showed biventricular function, grade 1 diastolic dysfunction, mild dilatation of the aortic root measuring 38 mm.  Reports symptoms have resolved, no further cardiac work-up recommended at this time.  CAD: Calcium score 645 in 2015.  Has been unable to tolerate statin, Repatha, or Zetia.  Referred to lipid clinic and started on  Praluent on 04/10/2020.  LDL 55 on 12/18/2020.  Leg pain: Normal ABIs on 04/10/20  Hyperlipidemia: Has been unable to tolerate statins, Repatha, and Zetia.   LDL 115 on 11/02/2019, though that was on Zetia.  Goal LDL less than 70 given CAD history with calcium score 645 in 2015.  Referred to lipid clinic and started on Praluent on 04/10/2020.  Reported leg pain on Praluent but tolerating taking it every 3 weeks.  LDL 55 on 12/18/2020.  Hypertension: On amlodipine 10 mg daily, telmisartan 80 mg daily, chlorthalidone 25 mg daily.  Follows with [name], reports has monitor where measurements are automatically sent to his office.  Appears controlled.  Family history of HCM: Son was diagnosed with apical hypertrophic cardiomyopathy.  No evidence of HCM on recent TTE.  Discussed that his son's first degree relatives should be screened  OSA: reports compliance with CPAP  RTC in 6 months   Medication Adjustments/Labs and Tests Ordered: Current medicines are reviewed at length with the patient today.  Concerns regarding medicines are outlined above.  Orders Placed This Encounter  Procedures  . EKG 12-Lead   No orders of the defined types were placed in this encounter.   Patient Instructions  Medication Instructions:  Your physician recommends that you continue on your current medications as directed. Please refer to the Current Medication list given to you today.  *If you need a refill on your cardiac medications before your next appointment, please call your pharmacy*  Follow-Up: At [facility], you and your health needs are our priority.  As part of our continuing mission to provide you with exceptional heart care, we have created designated Provider Care Teams.  These Care Teams include your primary Cardiologist (physician) and Advanced Practice Providers (APPs -  Physician Assistants and Nurse Practitioners) who all work together to provide you with the care you need, when you  need it.  We  recommend signing up for the patient portal called "MyChart".  Sign up information is provided on this After Visit Summary.  MyChart is used to connect with patients for Virtual Visits (Telemedicine).  Patients are able to view lab/test results, encounter notes, upcoming appointments, etc.  Non-urgent messages can be sent to your provider as well.   To learn more about what you can do with MyChart, go to [email or web address].    Your next appointment:   12 month(s)  The format for your next appointment:   In Person  Provider:   Oswaldo Milian, MD         Signed, [name], MD  01/08/2021 9:15 AM    [place]

## 2021-01-07 NOTE — Telephone Encounter (Signed)
Submitted VOB for SynviscOne, left knee. Pending BV. 

## 2021-01-08 ENCOUNTER — Encounter: Payer: Self-pay | Admitting: Cardiology

## 2021-01-08 ENCOUNTER — Ambulatory Visit (INDEPENDENT_AMBULATORY_CARE_PROVIDER_SITE_OTHER): Payer: Medicare Other | Admitting: Cardiology

## 2021-01-08 ENCOUNTER — Other Ambulatory Visit: Payer: Self-pay

## 2021-01-08 VITALS — BP 132/74 | HR 63 | Ht 71.0 in | Wt 244.0 lb

## 2021-01-08 DIAGNOSIS — E785 Hyperlipidemia, unspecified: Secondary | ICD-10-CM | POA: Diagnosis not present

## 2021-01-08 DIAGNOSIS — Z8249 Family history of ischemic heart disease and other diseases of the circulatory system: Secondary | ICD-10-CM | POA: Diagnosis not present

## 2021-01-08 DIAGNOSIS — M79604 Pain in right leg: Secondary | ICD-10-CM | POA: Diagnosis not present

## 2021-01-08 DIAGNOSIS — I10 Essential (primary) hypertension: Secondary | ICD-10-CM | POA: Diagnosis not present

## 2021-01-08 DIAGNOSIS — R0609 Other forms of dyspnea: Secondary | ICD-10-CM

## 2021-01-08 DIAGNOSIS — M79605 Pain in left leg: Secondary | ICD-10-CM | POA: Diagnosis not present

## 2021-01-08 DIAGNOSIS — R0600 Dyspnea, unspecified: Secondary | ICD-10-CM | POA: Diagnosis not present

## 2021-01-08 DIAGNOSIS — I2510 Atherosclerotic heart disease of native coronary artery without angina pectoris: Secondary | ICD-10-CM

## 2021-01-08 NOTE — Patient Instructions (Signed)

## 2021-01-26 ENCOUNTER — Telehealth: Payer: Self-pay

## 2021-01-26 DIAGNOSIS — R29898 Other symptoms and signs involving the musculoskeletal system: Secondary | ICD-10-CM | POA: Diagnosis not present

## 2021-01-26 NOTE — Telephone Encounter (Signed)
Noted. Gel injection has been approved. See previous message in chart.

## 2021-01-26 NOTE — Telephone Encounter (Signed)
Patient called regarding approval for gel injection I went ahead and made a appointment for him to receive a gel injection tomorrow Wednesday 01/27/2021 at 2:15.

## 2021-01-26 NOTE — Telephone Encounter (Signed)
Approved for SynviscOne, left knee. Buy & Bill Medicare deductible has been met Covered at 100% after Medicare pays No Co-pay No PA required  Appt. 01/27/2021 with Dr. Dean. 

## 2021-01-27 ENCOUNTER — Encounter: Payer: Self-pay | Admitting: Orthopedic Surgery

## 2021-01-27 ENCOUNTER — Ambulatory Visit (INDEPENDENT_AMBULATORY_CARE_PROVIDER_SITE_OTHER): Payer: Medicare Other | Admitting: Orthopedic Surgery

## 2021-01-27 DIAGNOSIS — M1712 Unilateral primary osteoarthritis, left knee: Secondary | ICD-10-CM | POA: Diagnosis not present

## 2021-01-27 MED ORDER — LIDOCAINE HCL 1 % IJ SOLN
5.0000 mL | INTRAMUSCULAR | Status: AC | PRN
  Administered 2021-01-27: 5 mL

## 2021-01-27 MED ORDER — HYLAN G-F 20 48 MG/6ML IX SOSY
48.0000 mg | PREFILLED_SYRINGE | INTRA_ARTICULAR | Status: AC | PRN
  Administered 2021-01-27: 48 mg via INTRA_ARTICULAR

## 2021-01-27 NOTE — Progress Notes (Signed)
   Procedure Note  Patient: Rick Turner             Date of Birth: November 14, 1950           MRN: [id number]             Visit Date: 01/27/2021  Procedures: Visit Diagnoses:  1. Arthritis of left knee     Large Joint Inj: L knee on 01/27/2021 8:34 PM Indications: pain, joint swelling and diagnostic evaluation Details: 18 G 1.5 in needle, superolateral approach  Arthrogram: No  Medications: 5 mL lidocaine 1 %; 48 mg Hylan 48 MG/6ML Outcome: tolerated well, no immediate complications Procedure, treatment alternatives, risks and benefits explained, specific risks discussed. Consent was given by the patient. Immediately prior to procedure a time out was called to verify the correct patient, procedure, equipment, support staff and site/side marked as required. Patient was prepped and draped in the usual sterile fashion.

## 2021-01-29 DIAGNOSIS — M19012 Primary osteoarthritis, left shoulder: Secondary | ICD-10-CM | POA: Diagnosis not present

## 2021-01-29 DIAGNOSIS — M25512 Pain in left shoulder: Secondary | ICD-10-CM | POA: Diagnosis not present

## 2021-01-29 DIAGNOSIS — M19011 Primary osteoarthritis, right shoulder: Secondary | ICD-10-CM | POA: Diagnosis not present

## 2021-01-29 DIAGNOSIS — M25511 Pain in right shoulder: Secondary | ICD-10-CM | POA: Diagnosis not present

## 2021-02-04 DIAGNOSIS — I10 Essential (primary) hypertension: Secondary | ICD-10-CM | POA: Diagnosis not present

## 2021-03-05 DIAGNOSIS — I10 Essential (primary) hypertension: Secondary | ICD-10-CM | POA: Diagnosis not present

## 2021-03-16 DIAGNOSIS — R972 Elevated prostate specific antigen [PSA]: Secondary | ICD-10-CM | POA: Diagnosis not present

## 2021-03-20 DIAGNOSIS — Z23 Encounter for immunization: Secondary | ICD-10-CM | POA: Diagnosis not present

## 2021-04-06 DIAGNOSIS — I10 Essential (primary) hypertension: Secondary | ICD-10-CM | POA: Diagnosis not present

## 2021-04-07 DIAGNOSIS — I10 Essential (primary) hypertension: Secondary | ICD-10-CM | POA: Diagnosis not present

## 2021-04-07 DIAGNOSIS — K219 Gastro-esophageal reflux disease without esophagitis: Secondary | ICD-10-CM | POA: Diagnosis not present

## 2021-04-07 DIAGNOSIS — I2510 Atherosclerotic heart disease of native coronary artery without angina pectoris: Secondary | ICD-10-CM | POA: Diagnosis not present

## 2021-04-07 DIAGNOSIS — N400 Enlarged prostate without lower urinary tract symptoms: Secondary | ICD-10-CM | POA: Diagnosis not present

## 2021-04-07 DIAGNOSIS — E7800 Pure hypercholesterolemia, unspecified: Secondary | ICD-10-CM | POA: Diagnosis not present

## 2021-04-07 DIAGNOSIS — E039 Hypothyroidism, unspecified: Secondary | ICD-10-CM | POA: Diagnosis not present

## 2021-04-07 DIAGNOSIS — M1712 Unilateral primary osteoarthritis, left knee: Secondary | ICD-10-CM | POA: Diagnosis not present

## 2021-04-21 ENCOUNTER — Encounter: Payer: Self-pay | Admitting: *Deleted

## 2021-04-21 ENCOUNTER — Other Ambulatory Visit: Payer: Self-pay | Admitting: *Deleted

## 2021-04-26 ENCOUNTER — Other Ambulatory Visit: Payer: Self-pay

## 2021-04-26 ENCOUNTER — Ambulatory Visit (INDEPENDENT_AMBULATORY_CARE_PROVIDER_SITE_OTHER): Payer: Medicare Other | Admitting: Diagnostic Neuroimaging

## 2021-04-26 ENCOUNTER — Encounter: Payer: Self-pay | Admitting: Diagnostic Neuroimaging

## 2021-04-26 VITALS — BP 118/70 | HR 65 | Ht 71.0 in | Wt 230.0 lb

## 2021-04-26 DIAGNOSIS — M6281 Muscle weakness (generalized): Secondary | ICD-10-CM | POA: Diagnosis not present

## 2021-04-26 DIAGNOSIS — R202 Paresthesia of skin: Secondary | ICD-10-CM

## 2021-04-26 DIAGNOSIS — I2510 Atherosclerotic heart disease of native coronary artery without angina pectoris: Secondary | ICD-10-CM | POA: Diagnosis not present

## 2021-04-26 DIAGNOSIS — R200 Anesthesia of skin: Secondary | ICD-10-CM | POA: Diagnosis not present

## 2021-04-26 DIAGNOSIS — R972 Elevated prostate specific antigen [PSA]: Secondary | ICD-10-CM | POA: Diagnosis not present

## 2021-04-26 NOTE — Patient Instructions (Signed)
-   check labs and EMG

## 2021-04-26 NOTE — Progress Notes (Signed)
[facility]  PATIENT: Rick Turner DOB: [birth year]  REFERRING CLINICIAN: [name], MD HISTORY FROM: patient  REASON FOR VISIT: new consult    HISTORICAL  CHIEF COMPLAINT:  Chief Complaint  Patient presents with   Bilateral leg weakness    Rm 7 New Pt  "progressive leg weakness since 2017, persistent low back pain"     HISTORY OF PRESENT ILLNESS:   70 year old male here for evaluation of muscle weakness.  2017 patient was having difficulty getting up off the floor during exercise.  He developed burning station in the quads and hips.  Also having shortness of breath and dyspnea on exertion.  Nowadays having more troubles going up and down stairs or slopes on the grass.  Patient has been sad since 2005.  When he developed muscle weakness these were switched around to see if this would reduce muscle weakness.  He also tried alternate lipid-lowering injectable therapies but this did not seem to help.  Patient continues to have burning and cramps sensation in his feet especially when he wakes up.    REVIEW OF SYSTEMS: Full 14 system review of systems performed and negative with exception of: as per HPI.  ALLERGIES: Allergies  Allergen Reactions   Evolocumab     Other reaction(s): leg wekness   Hydrocod Polst-Cpm Polst Er     Other reaction(s): Unknown   Hydrocodone-Acetaminophen     Other reaction(s): itchy   Statins Other (See Comments)    Pt reports muscle weakness when taking statins    HOME MEDICATIONS: Outpatient Medications Prior to Visit  Medication Sig Dispense Refill   amLODipine (NORVASC) 10 MG tablet Take 10 mg by mouth daily.     aspirin EC 81 MG tablet 81 mg daily.     carvedilol (COREG) 6.25 MG tablet 1 tablet with food     chlorthalidone (HYGROTON) 25 MG tablet Take 25 mg by mouth daily.      Cholecalciferol (VITAMIN D3) 50 MCG (2000 UT) capsule 1 capsule     Cyanocobalamin (VITAMIN B12) 1000 MCG TBCR 1 tablet      cyclobenzaprine (FLEXERIL) 10 MG tablet Take 10 mg by mouth 3 (three) times daily as needed for muscle spasms.     diclofenac sodium (VOLTAREN) 1 % GEL APPLY TOPICALLY TO AFFECTED AREA ONCE DAILY AS NEEDED FOR PAIN 2 g 1   fexofenadine (ALLEGRA) 180 MG tablet Take 180 mg by mouth daily.      fluticasone (FLONASE) 50 MCG/ACT nasal spray Place 1 spray into both nostrils 2 (two) times daily.      gabapentin (NEURONTIN) 300 MG capsule Take 1 capsule by mouth 2 (two) times daily.     levothyroxine (SYNTHROID) 125 MCG tablet Take 125 mcg by mouth every morning.     Multiple Vitamin (MULTI VITAMIN) TABS 1 tablet     omeprazole (PRILOSEC) 20 MG capsule Take 20 mg by mouth daily.     potassium chloride (MICRO-K) 10 MEQ CR capsule Take by mouth.     tadalafil (CIALIS) 5 MG tablet Take 5 mg by mouth daily as needed (AS DIRECTED).      tamsulosin (FLOMAX) 0.4 MG CAPS capsule Take 0.4 mg by mouth daily.     telmisartan (MICARDIS) 80 MG tablet Take 80 mg by mouth daily.     acetaminophen (TYLENOL) 500 MG tablet Take 2 tablets (1,000 mg total) by mouth every 6 (six) hours. (Patient not taking: Reported on 04/26/2021) 50 tablet 0   Alirocumab (PRALUENT) 75 MG/ML  SOAJ Inject 1 pen into the skin every 14 (fourteen) days. (Patient not taking: Reported on 04/26/2021) 2 pen 11   amoxicillin (AMOXIL) 500 MG capsule TAKE 4 CAPSULES 1 HOUR PRIOR TO DENTAL WORK (Patient not taking: Reported on 04/26/2021)     No facility-administered medications prior to visit.    PAST MEDICAL HISTORY: Past Medical History:  Diagnosis Date   Acne rosacea    Arthritis    BPH (benign prostatic hypertrophy)    Clavicle fracture    left   Detached retina    left    Detached retina, left 2011   Fx ankle    left   GERD (gastroesophageal reflux disease)    Hyperlipidemia    Hypertension    Hypothyroidism    OSA (obstructive sleep apnea)    mild   Seasonal allergies    Sleep apnea    Weakness of both lower extremities      PAST SURGICAL HISTORY: Past Surgical History:  Procedure Laterality Date   CATARACT EXTRACTION  08/2011   left, right 2016   detached retiina  2011   left, 2014   EYE SURGERY     lasix   HERNIA REPAIR Right    ing   TONSILLECTOMY     TOTAL SHOULDER REPLACEMENT Left 2021    FAMILY HISTORY: Family History  Problem Relation Age of Onset   CAD Mother    CAD Father    Breast cancer Sister    Hypertension Brother    Lung cancer Brother    Hypertension Brother    Parkinson's disease Brother     SOCIAL HISTORY: Social History   Socioeconomic History   Marital status: Married    Spouse name: Not on file   Number of children: 3   Years of education: PhD   Highest education level: Professional school degree (e.g., MD, DDS, DVM, JD)  Occupational History   Not on file  Tobacco Use   Smoking status: Never   Smokeless tobacco: Never  Substance and Sexual Activity   Alcohol use: Yes    Alcohol/week: 6.0 - 10.0 standard drinks    Types: 6 - 10 Standard drinks or equivalent per week    Comment: 2-3 daily, 04/26/21 15-20 weekly   Drug use: Never   Sexual activity: Not on file  Other Topics Concern   Not on file  Social History Narrative   Lives with wife, retired    Caffeine 2-3 c day   Social Determinants of Health   Financial Resource Strain: Not on file  Food Insecurity: Not on file  Transportation Needs: Not on file  Physical Activity: Not on file  Stress: Not on file  Social Connections: Not on file  Intimate Partner Violence: Not on file     PHYSICAL EXAM  GENERAL EXAM/CONSTITUTIONAL: Vitals:  Vitals:   04/26/21 0816  BP: 118/70  Pulse: 65  Weight: 230 lb (104.3 kg)  Height: 5\' 11"  (1.803 m)   Body mass index is 32.08 kg/m. Wt Readings from Last 3 Encounters:  04/26/21 230 lb (104.3 kg)  01/08/21 244 lb (110.7 kg)  05/08/20 227 lb 12.8 oz (103.3 kg)   Patient is in no distress; well developed, nourished and groomed; neck is  supple  CARDIOVASCULAR: Examination of carotid arteries is normal; no carotid bruits Regular rate and rhythm, no murmurs Examination of peripheral vascular system by observation and palpation is normal  EYES: Ophthalmoscopic exam of optic discs and posterior segments is normal; no papilledema or  hemorrhages No results found.  MUSCULOSKELETAL: Gait, strength, tone, movements noted in Neurologic exam below  NEUROLOGIC: MENTAL STATUS:  No flowsheet data found. awake, alert, oriented to person, place and time recent and remote memory intact normal attention and concentration language fluent, comprehension intact, naming intact fund of knowledge appropriate  CRANIAL NERVE:  2nd - no papilledema on fundoscopic exam 2nd, 3rd, 4th, 6th - pupils equal and reactive to light, visual fields full to confrontation, extraocular muscles intact, no nystagmus 5th - facial sensation symmetric 7th - facial strength symmetric 8th - hearing intact 9th - palate elevates symmetrically, uvula midline 11th - shoulder shrug symmetric 12th - tongue protrusion midline  MOTOR:  normal bulk and tone, full strength in the BUE, BLE  SENSORY:  normal and symmetric to light touch DECR PP, TEMP AND VIB IN FEET  COORDINATION:  finger-nose-finger, fine finger movements normal  REFLEXES:  deep tendon reflexes 1+ and symmetric; EXCEPT TRACE IN LEFT ANKLE  GAIT/STATION:  narrow based gait     DIAGNOSTIC DATA (LABS, IMAGING, TESTING) - I reviewed patient records, labs, notes, testing and imaging myself where available.  Lab Results  Component Value Date   WBC 8.7 02/09/2019   HGB 11.3 (L) 02/09/2019   HCT 32.7 (L) 02/09/2019   MCV 94.8 02/09/2019   PLT 283 02/09/2019      Component Value Date/Time   NA 129 (L) 02/10/2019 0229   K 3.2 (L) 02/10/2019 0229   CL 96 (L) 02/10/2019 0229   CO2 24 02/10/2019 0229   GLUCOSE 118 (H) 02/10/2019 0229   BUN 14 02/10/2019 0229   CREATININE 1.17  02/10/2019 0229   CALCIUM 8.5 (L) 02/10/2019 0229   PROT 6.8 06/26/2020 0821   ALBUMIN 4.6 06/26/2020 0821   AST 23 06/26/2020 0821   ALT 16 06/26/2020 0821   ALKPHOS 77 06/26/2020 0821   BILITOT 0.4 06/26/2020 0821   GFRNONAA >60 02/10/2019 0229   GFRAA >60 02/10/2019 0229   Lab Results  Component Value Date   CHOL 150 06/26/2020   HDL 58 06/26/2020   LDLCALC 79 06/26/2020   TRIG 65 06/26/2020   CHOLHDL 2.6 06/26/2020   No results found for: HGBA1C No results found for: VITAMINB12 No results found for: TSH     ASSESSMENT AND PLAN  70 y.o. year old male here with:  Dx:  1. Muscle weakness   2. Numbness and tingling of both feet      PLAN:  - check labs and EMG/NCS  Orders Placed This Encounter  Procedures   CK   Aldolase   AChR Abs with Reflex to MuSK   NCV with EMG(electromyography)   Return for for NCV/EMG.    Penni Bombard, MD 2/68/3419, 6:22 AM Certified in Neurology, Neurophysiology and Neuroimaging  [facility] [address], [place], [place] [address] [phone]

## 2021-04-27 ENCOUNTER — Encounter: Payer: Self-pay | Admitting: Diagnostic Neuroimaging

## 2021-04-27 DIAGNOSIS — R29898 Other symptoms and signs involving the musculoskeletal system: Secondary | ICD-10-CM | POA: Diagnosis not present

## 2021-04-27 DIAGNOSIS — E7800 Pure hypercholesterolemia, unspecified: Secondary | ICD-10-CM | POA: Diagnosis not present

## 2021-04-27 DIAGNOSIS — R7301 Impaired fasting glucose: Secondary | ICD-10-CM | POA: Diagnosis not present

## 2021-04-27 DIAGNOSIS — I10 Essential (primary) hypertension: Secondary | ICD-10-CM | POA: Diagnosis not present

## 2021-05-02 LAB — ACHR ABS WITH REFLEX TO MUSK: AChR Binding Ab, Serum: 0.03 nmol/L (ref 0.00–0.24)

## 2021-05-02 LAB — CK: Total CK: 105 U/L (ref 41–331)

## 2021-05-02 LAB — ALDOLASE: Aldolase: 4.2 U/L (ref 3.3–10.3)

## 2021-05-02 LAB — MUSK ANTIBODIES: MuSK Antibodies: <1 U/mL

## 2021-05-04 ENCOUNTER — Telehealth: Payer: Self-pay | Admitting: *Deleted

## 2021-05-04 NOTE — Telephone Encounter (Signed)
LVM informing patient his labs are normal. He'll get NCS results as Dr Leta Baptist does test on 05/13/21. Left # for questions.

## 2021-05-06 DIAGNOSIS — I10 Essential (primary) hypertension: Secondary | ICD-10-CM | POA: Diagnosis not present

## 2021-05-13 ENCOUNTER — Encounter (INDEPENDENT_AMBULATORY_CARE_PROVIDER_SITE_OTHER): Payer: Medicare Other | Admitting: Diagnostic Neuroimaging

## 2021-05-13 ENCOUNTER — Ambulatory Visit (INDEPENDENT_AMBULATORY_CARE_PROVIDER_SITE_OTHER): Payer: Medicare Other | Admitting: Diagnostic Neuroimaging

## 2021-05-13 ENCOUNTER — Other Ambulatory Visit: Payer: Self-pay

## 2021-05-13 DIAGNOSIS — R748 Abnormal levels of other serum enzymes: Secondary | ICD-10-CM | POA: Diagnosis not present

## 2021-05-13 DIAGNOSIS — R6889 Other general symptoms and signs: Secondary | ICD-10-CM

## 2021-05-13 DIAGNOSIS — M6281 Muscle weakness (generalized): Secondary | ICD-10-CM

## 2021-05-13 DIAGNOSIS — Z79899 Other long term (current) drug therapy: Secondary | ICD-10-CM

## 2021-05-13 DIAGNOSIS — R799 Abnormal finding of blood chemistry, unspecified: Secondary | ICD-10-CM

## 2021-05-13 DIAGNOSIS — R200 Anesthesia of skin: Secondary | ICD-10-CM | POA: Diagnosis not present

## 2021-05-13 DIAGNOSIS — R739 Hyperglycemia, unspecified: Secondary | ICD-10-CM

## 2021-05-13 DIAGNOSIS — Z0289 Encounter for other administrative examinations: Secondary | ICD-10-CM

## 2021-05-13 DIAGNOSIS — R202 Paresthesia of skin: Secondary | ICD-10-CM | POA: Diagnosis not present

## 2021-05-13 DIAGNOSIS — E569 Vitamin deficiency, unspecified: Secondary | ICD-10-CM

## 2021-05-13 NOTE — Progress Notes (Signed)
EMG normal. Will check addl labs and MRIs. If normal, then [facility].   Orders Placed This Encounter  Procedures   MR BRAIN W WO CONTRAST   MR CERVICAL SPINE W WO CONTRAST   MR THORACIC SPINE W WO CONTRAST   CBC with diff   CMP   Vitamin B12   MMA   Homocysteine   A1c   TSH   SPEP with IFE   ANA w/Reflex   SSA, SSB   ESR   CRP   Vitamin B1   Vitamin B6   Copper   ANCA    Penni Bombard, MD 0/0/4471, 5:80 PM Certified in Neurology, Neurophysiology and Neuroimaging  [facility] [address] [place], [place] [address] [phone]

## 2021-05-13 NOTE — Procedures (Signed)
[facility]  NCS (NERVE CONDUCTION STUDY) WITH EMG (ELECTROMYOGRAPHY) REPORT   STUDY DATE: 05/13/21 PATIENT NAME: Rick Turner DOB: [birth year] MRN: [id number]  ORDERING CLINICIAN: Andrey Spearman, MD   TECHNOLOGIST: Sherre Scarlet ELECTROMYOGRAPHER: Earlean Polka. Chimere Klingensmith, MD  CLINICAL INFORMATION: 70 year old male with intermittent exertional muscle cramps, burning and numbness in the legs.  FINDINGS: NERVE CONDUCTION STUDY:  Bilateral peroneal and tibial motor responses are normal.  Bilateral sural and superficial peroneal sensory responses are normal.  Bilateral tibial F wave latencies are normal.    NEEDLE ELECTROMYOGRAPHY:  Needle examination of right lower extremity is normal.   IMPRESSION:   Normal study.  No electrodiagnostic evidence of large fiber neuropathy or myopathy at this time.   INTERPRETING PHYSICIAN:  Penni Bombard, MD Certified in Neurology, Neurophysiology and Neuroimaging  [facility] [address], [place], [place] [address] [phone]   [facility]    Nerve / Sites Muscle Latency Ref. Amplitude Ref. Rel Amp Segments Distance Velocity Ref. Area    ms ms mV mV %  cm m/s m/s mVms  R Peroneal - EDB     Ankle EDB 4.5 ?6.5 3.7 ?2.0 100 Ankle - EDB 9   11.8     Fib head EDB 11.0  3.0  81.8 Fib head - Ankle 29 44 ?44 11.0     Pop fossa EDB 13.3  3.0  100 Pop fossa - Fib head 10 44 ?44 11.7         Pop fossa - Ankle      L Peroneal - EDB     Ankle EDB 4.1 ?6.5 2.9 ?2.0 100 Ankle - EDB 9   9.2     Fib head EDB 10.6  2.5  85.4 Fib head - Ankle 29 44 ?44 8.3     Pop fossa EDB 12.9  2.5  99.8 Pop fossa - Fib head 10 44 ?44 8.3         Pop fossa - Ankle      R Tibial - AH     Ankle AH 3.2 ?5.8 7.0 ?4.0 100 Ankle - AH 9   17.7     Pop fossa AH 12.5  4.6  65.3 Pop fossa - Ankle 40 43 ?41 20.7  L Tibial - AH     Ankle AH 3.8 ?5.8 7.8 ?4.0 100 Ankle - AH 9   17.1     Pop fossa AH 13.9  4.9  62.7 Pop  fossa - Ankle 41 41 ?41 14.1             SNC    Nerve / Sites Rec. Site Peak Lat Ref.  Amp Ref. Segments Distance    ms ms V V  cm  R Sural - Ankle (Calf)     Calf Ankle 3.8 ?4.4 6 ?6 Calf - Ankle 14  L Sural - Ankle (Calf)     Calf Ankle 4.0 ?4.4 6 ?6 Calf - Ankle 14  R Superficial peroneal - Ankle     Lat leg Ankle 3.9 ?4.4 6 ?6 Lat leg - Ankle 14  L Superficial peroneal - Ankle     Lat leg Ankle 3.6 ?4.4 6 ?6 Lat leg - Ankle 14             F  Wave    Nerve F Lat Ref.   ms ms  R Tibial - AH 54.7 ?56.0  L Tibial - AH 55.9 ?56.0  EMG Summary Table    Spontaneous MUAP Recruitment  Muscle IA Fib PSW Fasc Other Amp Dur. Poly Pattern  R. Vastus medialis Normal None None None _______ Normal Normal Normal Normal  R. Tibialis anterior Normal None None None _______ Normal Normal Normal Normal  R. Gastrocnemius (Medial head) Normal None None None _______ Normal Normal Normal Normal

## 2021-05-16 DIAGNOSIS — E7800 Pure hypercholesterolemia, unspecified: Secondary | ICD-10-CM | POA: Diagnosis not present

## 2021-05-16 DIAGNOSIS — N400 Enlarged prostate without lower urinary tract symptoms: Secondary | ICD-10-CM | POA: Diagnosis not present

## 2021-05-16 DIAGNOSIS — I10 Essential (primary) hypertension: Secondary | ICD-10-CM | POA: Diagnosis not present

## 2021-05-16 DIAGNOSIS — M1712 Unilateral primary osteoarthritis, left knee: Secondary | ICD-10-CM | POA: Diagnosis not present

## 2021-05-16 DIAGNOSIS — K219 Gastro-esophageal reflux disease without esophagitis: Secondary | ICD-10-CM | POA: Diagnosis not present

## 2021-05-16 DIAGNOSIS — E039 Hypothyroidism, unspecified: Secondary | ICD-10-CM | POA: Diagnosis not present

## 2021-05-16 DIAGNOSIS — I2510 Atherosclerotic heart disease of native coronary artery without angina pectoris: Secondary | ICD-10-CM | POA: Diagnosis not present

## 2021-05-17 ENCOUNTER — Other Ambulatory Visit: Payer: Self-pay | Admitting: *Deleted

## 2021-05-17 MED ORDER — ALPRAZOLAM 0.5 MG PO TABS: 0.5000 mg | ORAL_TABLET | ORAL | 0 refills | Status: DC | PRN

## 2021-05-18 LAB — SJOGREN'S SYNDROME ANTIBODS(SSA + SSB)
ENA SSA (RO) Ab: <0.2 AI (ref 0.0–0.9)
ENA SSB (LA) Ab: <0.2 AI (ref 0.0–0.9)

## 2021-05-18 LAB — COMPREHENSIVE METABOLIC PANEL
ALT: 15 IU/L (ref 0–44)
AST: 23 IU/L (ref 0–40)
Albumin/Globulin Ratio: 2.1 (ref 1.2–2.2)
Albumin: 4.5 g/dL (ref 3.8–4.8)
Alkaline Phosphatase: 72 IU/L (ref 44–121)
BUN/Creatinine Ratio: 17 (ref 10–24)
BUN: 22 mg/dL (ref 8–27)
Bilirubin Total: 0.5 mg/dL (ref 0.0–1.2)
CO2: 21 mmol/L (ref 20–29)
Calcium: 8.8 mg/dL (ref 8.6–10.2)
Chloride: 96 mmol/L (ref 96–106)
Creatinine, Ser: 1.31 mg/dL — ABNORMAL HIGH (ref 0.76–1.27)
Globulin, Total: 2.1 g/dL (ref 1.5–4.5)
Glucose: 106 mg/dL — ABNORMAL HIGH (ref 65–99)
Potassium: 4 mmol/L (ref 3.5–5.2)
Sodium: 133 mmol/L — ABNORMAL LOW (ref 134–144)
Total Protein: 6.6 g/dL (ref 6.0–8.5)
eGFR: 59 mL/min/{1.73_m2} — ABNORMAL LOW (ref >59)

## 2021-05-18 LAB — CBC WITH DIFFERENTIAL/PLATELET
Basophils Absolute: 0 10*3/uL (ref 0.0–0.2)
Basos: 1 %
EOS (ABSOLUTE): 0.2 10*3/uL (ref 0.0–0.4)
Eos: 7 %
Hematocrit: 34.9 % — ABNORMAL LOW (ref 37.5–51.0)
Hemoglobin: 12.1 g/dL — ABNORMAL LOW (ref 13.0–17.7)
Immature Grans (Abs): 0 10*3/uL (ref 0.0–0.1)
Immature Granulocytes: 0 %
Lymphocytes Absolute: 0.8 10*3/uL (ref 0.7–3.1)
Lymphs: 25 %
MCH: 35.2 pg — ABNORMAL HIGH (ref 26.6–33.0)
MCHC: 34.7 g/dL (ref 31.5–35.7)
MCV: 102 fL — ABNORMAL HIGH (ref 79–97)
Monocytes Absolute: 0.4 10*3/uL (ref 0.1–0.9)
Monocytes: 11 %
Neutrophils Absolute: 1.8 10*3/uL (ref 1.4–7.0)
Neutrophils: 56 %
Platelets: 238 10*3/uL (ref 150–450)
RBC: 3.44 x10E6/uL — ABNORMAL LOW (ref 4.14–5.80)
RDW: 12.1 % (ref 11.6–15.4)
WBC: 3.2 10*3/uL — ABNORMAL LOW (ref 3.4–10.8)

## 2021-05-18 LAB — METHYLMALONIC ACID, SERUM: Methylmalonic Acid: 180 nmol/L (ref 0–378)

## 2021-05-18 LAB — MULTIPLE MYELOMA PANEL, SERUM
Albumin SerPl Elph-Mcnc: 4.1 g/dL (ref 2.9–4.4)
Albumin/Glob SerPl: 1.7 (ref 0.7–1.7)
Alpha 1: 0.2 g/dL (ref 0.0–0.4)
Alpha2 Glob SerPl Elph-Mcnc: 0.6 g/dL (ref 0.4–1.0)
B-Globulin SerPl Elph-Mcnc: 0.9 g/dL (ref 0.7–1.3)
Gamma Glob SerPl Elph-Mcnc: 0.9 g/dL (ref 0.4–1.8)
Globulin, Total: 2.5 g/dL (ref 2.2–3.9)
IgA/Immunoglobulin A, Serum: 234 mg/dL (ref 61–437)
IgG (Immunoglobin G), Serum: 964 mg/dL (ref 603–1613)
IgM (Immunoglobulin M), Srm: 67 mg/dL (ref 20–172)

## 2021-05-18 LAB — TSH: TSH: 1.02 u[IU]/mL (ref 0.450–4.500)

## 2021-05-18 LAB — SEDIMENTATION RATE: Sed Rate: 13 mm/hr (ref 0–30)

## 2021-05-18 LAB — HEMOGLOBIN A1C
Est. average glucose Bld gHb Est-mCnc: 108 mg/dL
Hgb A1c MFr Bld: 5.4 % (ref 4.8–5.6)

## 2021-05-18 LAB — VITAMIN B12: Vitamin B-12: 698 pg/mL (ref 232–1245)

## 2021-05-18 LAB — C-REACTIVE PROTEIN: CRP: <1 mg/L (ref 0–10)

## 2021-05-18 LAB — COPPER, SERUM: Copper: 108 ug/dL (ref 69–132)

## 2021-05-18 LAB — VITAMIN B1: Thiamine: 69.3 nmol/L (ref 66.5–200.0)

## 2021-05-18 LAB — VITAMIN B6: Vitamin B6: 30.6 ug/L (ref 3.4–65.2)

## 2021-05-18 LAB — HOMOCYSTEINE: Homocysteine: 19.5 umol/L — ABNORMAL HIGH (ref 0.0–17.2)

## 2021-05-26 ENCOUNTER — Ambulatory Visit
Admission: RE | Admit: 2021-05-26 | Discharge: 2021-05-26 | Disposition: A | Discharge status: Home / Self Care | Payer: Medicare Other | Source: Ambulatory Visit | Attending: Diagnostic Neuroimaging | Admitting: Diagnostic Neuroimaging

## 2021-05-26 ENCOUNTER — Other Ambulatory Visit: Payer: Self-pay

## 2021-05-26 DIAGNOSIS — R200 Anesthesia of skin: Secondary | ICD-10-CM

## 2021-05-26 DIAGNOSIS — M6281 Muscle weakness (generalized): Secondary | ICD-10-CM

## 2021-05-26 DIAGNOSIS — R202 Paresthesia of skin: Secondary | ICD-10-CM

## 2021-05-26 DIAGNOSIS — Z79899 Other long term (current) drug therapy: Secondary | ICD-10-CM

## 2021-05-26 DIAGNOSIS — R6889 Other general symptoms and signs: Secondary | ICD-10-CM

## 2021-05-26 MED ORDER — GADOBENATE DIMEGLUMINE 529 MG/ML IV SOLN
20.0000 mL | Freq: Once | INTRAVENOUS | Status: AC | PRN
  Administered 2021-05-26: 20 mL via INTRAVENOUS

## 2021-05-27 ENCOUNTER — Telehealth: Payer: Self-pay

## 2021-05-27 NOTE — Telephone Encounter (Signed)
-----   Message from Penni Bombard, MD sent at 05/26/2021  5:14 PM EDT ----- Overall labs ok except mild anemia, mild changes in sodium, glucose and kidney function. Could follow these up with PCP. Other labs ok. Continue current plan. Please call patient. -VRP

## 2021-05-27 NOTE — Telephone Encounter (Signed)
Sent patient message via MyChart stating Goodmorning Rick Turner,  I wanted to let you know your overall labs ok except mild anemia, mild changes in sodium, glucose and kidney function. You can follow these up with PCP. Other labs ok, please Continue current plan and medications. If you have any questions, please let us know. Have a great day!

## 2021-05-28 DIAGNOSIS — Z1211 Encounter for screening for malignant neoplasm of colon: Secondary | ICD-10-CM | POA: Diagnosis not present

## 2021-05-28 DIAGNOSIS — D122 Benign neoplasm of ascending colon: Secondary | ICD-10-CM | POA: Diagnosis not present

## 2021-05-29 ENCOUNTER — Ambulatory Visit
Admission: RE | Admit: 2021-05-29 | Discharge: 2021-05-29 | Disposition: A | Discharge status: Home / Self Care | Payer: Medicare Other | Source: Ambulatory Visit | Attending: Diagnostic Neuroimaging | Admitting: Diagnostic Neuroimaging

## 2021-05-29 ENCOUNTER — Other Ambulatory Visit: Payer: Self-pay

## 2021-05-29 DIAGNOSIS — M6281 Muscle weakness (generalized): Secondary | ICD-10-CM | POA: Diagnosis not present

## 2021-05-29 DIAGNOSIS — R200 Anesthesia of skin: Secondary | ICD-10-CM

## 2021-05-29 DIAGNOSIS — R202 Paresthesia of skin: Secondary | ICD-10-CM

## 2021-05-29 DIAGNOSIS — R6889 Other general symptoms and signs: Secondary | ICD-10-CM

## 2021-05-29 DIAGNOSIS — Z79899 Other long term (current) drug therapy: Secondary | ICD-10-CM

## 2021-05-29 MED ORDER — GADOBENATE DIMEGLUMINE 529 MG/ML IV SOLN
20.0000 mL | Freq: Once | INTRAVENOUS | Status: AC | PRN
  Administered 2021-05-29: 20 mL via INTRAVENOUS

## 2021-06-01 DIAGNOSIS — D122 Benign neoplasm of ascending colon: Secondary | ICD-10-CM | POA: Diagnosis not present

## 2021-06-04 DIAGNOSIS — I10 Essential (primary) hypertension: Secondary | ICD-10-CM | POA: Diagnosis not present

## 2021-06-17 DIAGNOSIS — R1032 Left lower quadrant pain: Secondary | ICD-10-CM | POA: Diagnosis not present

## 2021-06-17 DIAGNOSIS — D649 Anemia, unspecified: Secondary | ICD-10-CM | POA: Diagnosis not present

## 2021-06-17 DIAGNOSIS — R972 Elevated prostate specific antigen [PSA]: Secondary | ICD-10-CM | POA: Diagnosis not present

## 2021-06-23 DIAGNOSIS — M25551 Pain in right hip: Secondary | ICD-10-CM | POA: Diagnosis not present

## 2021-06-24 ENCOUNTER — Ambulatory Visit
Admission: RE | Admit: 2021-06-24 | Discharge: 2021-06-24 | Disposition: A | Discharge status: Home / Self Care | Payer: Medicare Other | Source: Ambulatory Visit | Attending: Sports Medicine | Admitting: Sports Medicine

## 2021-06-24 ENCOUNTER — Other Ambulatory Visit: Payer: Self-pay

## 2021-06-24 ENCOUNTER — Other Ambulatory Visit: Payer: Self-pay | Admitting: Sports Medicine

## 2021-06-24 DIAGNOSIS — M47816 Spondylosis without myelopathy or radiculopathy, lumbar region: Secondary | ICD-10-CM | POA: Diagnosis not present

## 2021-06-24 DIAGNOSIS — M1611 Unilateral primary osteoarthritis, right hip: Secondary | ICD-10-CM | POA: Diagnosis not present

## 2021-06-24 DIAGNOSIS — M25551 Pain in right hip: Secondary | ICD-10-CM

## 2021-06-29 DIAGNOSIS — M1611 Unilateral primary osteoarthritis, right hip: Secondary | ICD-10-CM | POA: Diagnosis not present

## 2021-07-01 DIAGNOSIS — E871 Hypo-osmolality and hyponatremia: Secondary | ICD-10-CM | POA: Diagnosis not present

## 2021-07-19 DIAGNOSIS — E871 Hypo-osmolality and hyponatremia: Secondary | ICD-10-CM | POA: Diagnosis not present

## 2021-07-19 DIAGNOSIS — Z5181 Encounter for therapeutic drug level monitoring: Secondary | ICD-10-CM | POA: Diagnosis not present

## 2021-07-19 DIAGNOSIS — I10 Essential (primary) hypertension: Secondary | ICD-10-CM | POA: Diagnosis not present

## 2021-07-21 DIAGNOSIS — D649 Anemia, unspecified: Secondary | ICD-10-CM | POA: Diagnosis not present

## 2021-07-21 DIAGNOSIS — N400 Enlarged prostate without lower urinary tract symptoms: Secondary | ICD-10-CM | POA: Diagnosis not present

## 2021-07-21 DIAGNOSIS — E7800 Pure hypercholesterolemia, unspecified: Secondary | ICD-10-CM | POA: Diagnosis not present

## 2021-07-21 DIAGNOSIS — I10 Essential (primary) hypertension: Secondary | ICD-10-CM | POA: Diagnosis not present

## 2021-07-21 DIAGNOSIS — I2510 Atherosclerotic heart disease of native coronary artery without angina pectoris: Secondary | ICD-10-CM | POA: Diagnosis not present

## 2021-07-21 DIAGNOSIS — E871 Hypo-osmolality and hyponatremia: Secondary | ICD-10-CM | POA: Diagnosis not present

## 2021-07-21 DIAGNOSIS — R29898 Other symptoms and signs involving the musculoskeletal system: Secondary | ICD-10-CM | POA: Diagnosis not present

## 2021-07-22 ENCOUNTER — Telehealth: Payer: Self-pay

## 2021-07-22 ENCOUNTER — Other Ambulatory Visit: Payer: Self-pay

## 2021-07-22 ENCOUNTER — Ambulatory Visit (INDEPENDENT_AMBULATORY_CARE_PROVIDER_SITE_OTHER): Payer: Medicare Other | Admitting: Orthopedic Surgery

## 2021-07-22 DIAGNOSIS — I2510 Atherosclerotic heart disease of native coronary artery without angina pectoris: Secondary | ICD-10-CM

## 2021-07-22 DIAGNOSIS — M1712 Unilateral primary osteoarthritis, left knee: Secondary | ICD-10-CM

## 2021-07-22 NOTE — Telephone Encounter (Signed)
Noted  

## 2021-07-22 NOTE — Telephone Encounter (Signed)
Can we get auth for left knee gel injection?

## 2021-07-23 ENCOUNTER — Encounter: Payer: Self-pay | Admitting: Orthopedic Surgery

## 2021-07-23 DIAGNOSIS — M1712 Unilateral primary osteoarthritis, left knee: Secondary | ICD-10-CM

## 2021-07-23 MED ORDER — LIDOCAINE HCL 1 % IJ SOLN
5.0000 mL | INTRAMUSCULAR | Status: AC | PRN
  Administered 2021-07-23: 5 mL

## 2021-07-23 MED ORDER — METHYLPREDNISOLONE ACETATE 40 MG/ML IJ SUSP
40.0000 mg | INTRAMUSCULAR | Status: AC | PRN
  Administered 2021-07-23: 40 mg via INTRA_ARTICULAR

## 2021-07-23 MED ORDER — BUPIVACAINE HCL 0.25 % IJ SOLN
4.0000 mL | INTRAMUSCULAR | Status: AC | PRN
  Administered 2021-07-23: 4 mL via INTRA_ARTICULAR

## 2021-07-23 NOTE — Progress Notes (Signed)
Office Visit Note   Patient: Rick Turner           Date of Birth: 03/19/1951           MRN: [id number] Visit Date: 07/22/2021 Requested by: [name], MD [address] [place] [address] [place],   [address] PCP: [name], MD  Subjective: Chief Complaint  Patient presents with   Left Knee - Pain    HPI: Rick Turner is a 70 year old patient with left knee arthritis.  Infection in left knee last performed 01/27/2021.  Gel injection lasted 6 months.  Having some recurrence of pain but not enough to do any type of intervention.  Overall he is able to play golf and get around with fairly minimal symptoms.  Right knee is holding up well also.  No interval hospitalizations or surgeries.              ROS: All systems reviewed are negative as they relate to the chief complaint within the history of present illness.  Patient denies  fevers or chills.   Assessment & Plan: Visit Diagnoses:  1. Arthritis of left knee     Plan: Impression is left knee pain with arthritis.  Plan is cortisone injection today.  We will preapproved him for gel injection.  Continue with nonweightbearing quad strengthening exercises and follow-up as needed. This patient is diagnosed with osteoarthritis of the knee(s).    Radiographs show evidence of joint space narrowing, osteophytes, subchondral sclerosis and/or subchondral cysts.  This patient has knee pain which interferes with functional and activities of daily living.    This patient has experienced inadequate response, adverse effects and/or intolerance with conservative treatments such as acetaminophen, NSAIDS, topical creams, physical therapy or regular exercise, knee bracing and/or weight loss.   This patient has experienced inadequate response or has a contraindication to intra articular steroid injections for at least 3 months.   This patient is not scheduled to have a total knee replacement within 6 months of starting treatment with  viscosupplementation.   Follow-Up Instructions: Return if symptoms worsen or fail to improve.   Orders:  No orders of the defined types were placed in this encounter.  No orders of the defined types were placed in this encounter.     Procedures: Large Joint Inj: L knee on 07/23/2021 7:21 AM Indications: diagnostic evaluation, joint swelling and pain Details: 18 G 1.5 in needle, superolateral approach  Arthrogram: No  Medications: 5 mL lidocaine 1 %; 40 mg methylPREDNISolone acetate 40 MG/ML; 4 mL bupivacaine 0.25 % Outcome: tolerated well, no immediate complications Procedure, treatment alternatives, risks and benefits explained, specific risks discussed. Consent was given by the patient. Immediately prior to procedure a time out was called to verify the correct patient, procedure, equipment, support staff and site/side marked as required. Patient was prepped and draped in the usual sterile fashion.      Clinical Data: No additional findings.  Objective: Vital Signs: There were no vitals taken for this visit.  Physical Exam:   Constitutional: Patient appears well-developed HEENT:  Head: Normocephalic Eyes:EOM are normal Neck: Normal range of motion Cardiovascular: Normal rate Pulmonary/chest: Effort normal Neurologic: Patient is alert Skin: Skin is warm Psychiatric: Patient has normal mood and affect   Ortho Exam: Ortho exam demonstrates full active and passive range of motion of the hips.  Left knee has about a 5 degree flexion contracture but bends well to 120.  Trace effusion is present.  This is aspirated today.  Extensor mechanism  intact.  Collateral and cruciate ligaments stable.  No other masses lymphadenopathy or skin changes noted in that left knee region.  Specialty Comments:  No specialty comments available.  Imaging: No results found.   PMFS History: Patient Active Problem List   Diagnosis Date Noted   Hyperlipidemia 04/10/2020   Rib fracture  02/09/2019   Macula-off rhegmatogenous retinal detachment 04/19/2016   Dry eye syndrome 08/20/2013   Nuclear sclerotic cataract 08/20/2013   Pseudoaphakia 08/20/2013   Detached retina 08/20/2013   Past Medical History:  Diagnosis Date   Acne rosacea    Arthritis    BPH (benign prostatic hypertrophy)    Clavicle fracture    left   Detached retina    left    Detached retina, left 2011   Fx ankle    left   GERD (gastroesophageal reflux disease)    Hyperlipidemia    Hypertension    Hypothyroidism    OSA (obstructive sleep apnea)    mild   Seasonal allergies    Sleep apnea    Weakness of both lower extremities     Family History  Problem Relation Age of Onset   CAD Mother    CAD Father    Breast cancer Sister    Hypertension Brother    Lung cancer Brother    Hypertension Brother    Parkinson's disease Brother     Past Surgical History:  Procedure Laterality Date   CATARACT EXTRACTION  08/2011   left, right 2016   detached retiina  2011   left, 2014   EYE SURGERY     lasix   HERNIA REPAIR Right    ing   TONSILLECTOMY     TOTAL SHOULDER REPLACEMENT Left 2021   Social History   Occupational History   Not on file  Tobacco Use   Smoking status: Never   Smokeless tobacco: Never  Substance and Sexual Activity   Alcohol use: Yes    Alcohol/week: 6.0 - 10.0 standard drinks    Types: 6 - 10 Standard drinks or equivalent per week    Comment: 2-3 daily, 04/26/21 15-20 weekly   Drug use: Never   Sexual activity: Not on file

## 2021-07-26 DIAGNOSIS — Z23 Encounter for immunization: Secondary | ICD-10-CM | POA: Diagnosis not present

## 2021-07-26 DIAGNOSIS — E871 Hypo-osmolality and hyponatremia: Secondary | ICD-10-CM | POA: Diagnosis not present

## 2021-07-30 DIAGNOSIS — K219 Gastro-esophageal reflux disease without esophagitis: Secondary | ICD-10-CM | POA: Diagnosis not present

## 2021-07-30 DIAGNOSIS — M1712 Unilateral primary osteoarthritis, left knee: Secondary | ICD-10-CM | POA: Diagnosis not present

## 2021-07-30 DIAGNOSIS — N400 Enlarged prostate without lower urinary tract symptoms: Secondary | ICD-10-CM | POA: Diagnosis not present

## 2021-07-30 DIAGNOSIS — I2510 Atherosclerotic heart disease of native coronary artery without angina pectoris: Secondary | ICD-10-CM | POA: Diagnosis not present

## 2021-07-30 DIAGNOSIS — I10 Essential (primary) hypertension: Secondary | ICD-10-CM | POA: Diagnosis not present

## 2021-07-30 DIAGNOSIS — E039 Hypothyroidism, unspecified: Secondary | ICD-10-CM | POA: Diagnosis not present

## 2021-07-30 DIAGNOSIS — E7800 Pure hypercholesterolemia, unspecified: Secondary | ICD-10-CM | POA: Diagnosis not present

## 2021-07-30 DIAGNOSIS — M1611 Unilateral primary osteoarthritis, right hip: Secondary | ICD-10-CM | POA: Diagnosis not present

## 2021-08-06 DIAGNOSIS — I10 Essential (primary) hypertension: Secondary | ICD-10-CM | POA: Diagnosis not present

## 2021-08-18 ENCOUNTER — Telehealth: Payer: Self-pay

## 2021-08-18 NOTE — Telephone Encounter (Signed)
VOB has been submitted for SynviscOne, left knee. Pending BV. 

## 2021-08-20 DIAGNOSIS — M19012 Primary osteoarthritis, left shoulder: Secondary | ICD-10-CM | POA: Diagnosis not present

## 2021-08-20 DIAGNOSIS — M25512 Pain in left shoulder: Secondary | ICD-10-CM | POA: Diagnosis not present

## 2021-08-24 ENCOUNTER — Other Ambulatory Visit: Payer: Self-pay | Admitting: Sports Medicine

## 2021-08-24 ENCOUNTER — Telehealth: Payer: Self-pay

## 2021-08-24 DIAGNOSIS — R1031 Right lower quadrant pain: Secondary | ICD-10-CM

## 2021-08-24 NOTE — Telephone Encounter (Signed)
Called and left a VM for patient to CB to schedule for gel injection with Dr. Marlou Sa.  Approved for SynviscOne, left knee. [place] Patient is covered at 100% through [facility] after Medicare pays. No Co-pay No PA required

## 2021-08-27 DIAGNOSIS — I10 Essential (primary) hypertension: Secondary | ICD-10-CM | POA: Diagnosis not present

## 2021-08-27 DIAGNOSIS — E7800 Pure hypercholesterolemia, unspecified: Secondary | ICD-10-CM | POA: Diagnosis not present

## 2021-08-27 DIAGNOSIS — R1031 Right lower quadrant pain: Secondary | ICD-10-CM | POA: Diagnosis not present

## 2021-08-27 DIAGNOSIS — R29898 Other symptoms and signs involving the musculoskeletal system: Secondary | ICD-10-CM | POA: Diagnosis not present

## 2021-09-02 ENCOUNTER — Telehealth: Payer: Self-pay | Admitting: Diagnostic Neuroimaging

## 2021-09-02 NOTE — Telephone Encounter (Signed)
Called by [name] to discuss patient.  Patient has been off of cholesterol-lowering medications for several months but symptoms continue.  I called patient to discuss.  Patient tells me that he continues to have problems when he is walking up inclines at a golf course or walking up steps.  He feels a lot of pain and pressure and strain in his quads when he does this.  When he is walking on level ground or cycling or other exercises he feels okay.  Reviewed prior testing including MRI of the brain, cervical, thoracic, lumbar spine, EMG testing and extensive lab testing.  Many central and peripheral nervous system etiologies have been ruled out.  At this point I do not have a specific explanation for patient's symptoms.  I suspect some of this could be related to age-related deconditioning or muscle imbalance.  I recommended patient start a strength training program to improve posterior chain and leg strength for the next 6 weeks to see if this improves his functioning.  He will report back after that to let us know.  If symptoms still are persistent then may consider second opinion at academic [facility].  Penni Bombard, MD 89/21/1941, 7:40 PM Certified in Neurology, Neurophysiology and Neuroimaging  [facility] [address], [place], [place] [address] [phone]

## 2021-09-06 DIAGNOSIS — R972 Elevated prostate specific antigen [PSA]: Secondary | ICD-10-CM | POA: Diagnosis not present

## 2021-09-06 DIAGNOSIS — I10 Essential (primary) hypertension: Secondary | ICD-10-CM | POA: Diagnosis not present

## 2021-09-13 DIAGNOSIS — N5201 Erectile dysfunction due to arterial insufficiency: Secondary | ICD-10-CM | POA: Diagnosis not present

## 2021-09-13 DIAGNOSIS — R350 Frequency of micturition: Secondary | ICD-10-CM | POA: Diagnosis not present

## 2021-09-13 DIAGNOSIS — R972 Elevated prostate specific antigen [PSA]: Secondary | ICD-10-CM | POA: Diagnosis not present

## 2021-09-18 ENCOUNTER — Ambulatory Visit
Admission: RE | Admit: 2021-09-18 | Discharge: 2021-09-18 | Disposition: A | Discharge status: Home / Self Care | Payer: Medicare Other | Source: Ambulatory Visit | Attending: Sports Medicine | Admitting: Sports Medicine

## 2021-09-18 ENCOUNTER — Other Ambulatory Visit: Payer: Self-pay

## 2021-09-18 DIAGNOSIS — M160 Bilateral primary osteoarthritis of hip: Secondary | ICD-10-CM | POA: Diagnosis not present

## 2021-09-18 DIAGNOSIS — M533 Sacrococcygeal disorders, not elsewhere classified: Secondary | ICD-10-CM | POA: Diagnosis not present

## 2021-09-18 DIAGNOSIS — R1031 Right lower quadrant pain: Secondary | ICD-10-CM

## 2021-09-18 DIAGNOSIS — K4020 Bilateral inguinal hernia, without obstruction or gangrene, not specified as recurrent: Secondary | ICD-10-CM | POA: Diagnosis not present

## 2021-09-18 DIAGNOSIS — M25751 Osteophyte, right hip: Secondary | ICD-10-CM | POA: Diagnosis not present

## 2021-09-18 MED ORDER — GADOBENATE DIMEGLUMINE 529 MG/ML IV SOLN
20.0000 mL | Freq: Once | INTRAVENOUS | Status: AC | PRN
  Administered 2021-09-18: 20 mL via INTRAVENOUS

## 2021-09-21 DIAGNOSIS — I10 Essential (primary) hypertension: Secondary | ICD-10-CM | POA: Diagnosis not present

## 2021-09-22 ENCOUNTER — Ambulatory Visit (INDEPENDENT_AMBULATORY_CARE_PROVIDER_SITE_OTHER): Payer: Medicare Other | Admitting: Orthopedic Surgery

## 2021-09-22 ENCOUNTER — Ambulatory Visit (INDEPENDENT_AMBULATORY_CARE_PROVIDER_SITE_OTHER): Payer: Medicare Other

## 2021-09-22 ENCOUNTER — Other Ambulatory Visit: Payer: Self-pay

## 2021-09-22 DIAGNOSIS — I2510 Atherosclerotic heart disease of native coronary artery without angina pectoris: Secondary | ICD-10-CM | POA: Diagnosis not present

## 2021-09-22 DIAGNOSIS — M79601 Pain in right arm: Secondary | ICD-10-CM

## 2021-09-22 DIAGNOSIS — M1712 Unilateral primary osteoarthritis, left knee: Secondary | ICD-10-CM | POA: Diagnosis not present

## 2021-09-24 ENCOUNTER — Encounter: Payer: Self-pay | Admitting: Orthopedic Surgery

## 2021-09-24 DIAGNOSIS — H5201 Hypermetropia, right eye: Secondary | ICD-10-CM | POA: Diagnosis not present

## 2021-09-24 DIAGNOSIS — R300 Dysuria: Secondary | ICD-10-CM | POA: Diagnosis not present

## 2021-09-24 DIAGNOSIS — H524 Presbyopia: Secondary | ICD-10-CM | POA: Diagnosis not present

## 2021-09-24 DIAGNOSIS — R350 Frequency of micturition: Secondary | ICD-10-CM | POA: Diagnosis not present

## 2021-09-24 DIAGNOSIS — Z9849 Cataract extraction status, unspecified eye: Secondary | ICD-10-CM | POA: Diagnosis not present

## 2021-09-24 DIAGNOSIS — M6281 Muscle weakness (generalized): Secondary | ICD-10-CM | POA: Diagnosis not present

## 2021-09-24 DIAGNOSIS — H5212 Myopia, left eye: Secondary | ICD-10-CM | POA: Diagnosis not present

## 2021-09-24 DIAGNOSIS — H52223 Regular astigmatism, bilateral: Secondary | ICD-10-CM | POA: Diagnosis not present

## 2021-09-24 DIAGNOSIS — M25551 Pain in right hip: Secondary | ICD-10-CM | POA: Diagnosis not present

## 2021-09-24 MED ORDER — LIDOCAINE HCL 1 % IJ SOLN
5.0000 mL | INTRAMUSCULAR | Status: AC | PRN
  Administered 2021-09-22: 5 mL

## 2021-09-24 MED ORDER — HYLAN G-F 20 48 MG/6ML IX SOSY
48.0000 mg | PREFILLED_SYRINGE | INTRA_ARTICULAR | Status: AC | PRN
  Administered 2021-09-22: 48 mg via INTRA_ARTICULAR

## 2021-09-24 NOTE — Progress Notes (Signed)
Office Visit Note   Patient: Rick Turner           Date of Birth: 1951/08/18           MRN: [id number] Visit Date: 09/22/2021 Requested by: Rick Orn, MD [address] [place],  [place] [address] PCP: Rick Orn, MD  Subjective: Chief Complaint  Patient presents with   Neck - Pain   Knee Pain    HPI: Rick Turner is a 70 year old patient presents for Synvisc 1 left knee.  He also reports right upper extremity pain with no known injury for the past 4 weeks.  Describes neck pain but no numbness and tingling.  Reports decreased range of motion of the neck.  The pain does wake him from sleep at night.  Denies any shoulder pain but does report relief by placing his arm overhead.  Unread MRI scan from 4 weeks ago is reviewed.  This does show disc pathology at the C4-5 and C5-6 level.  Patient has known arthritis in the left knee which has been stable.              ROS: All systems reviewed are negative as they relate to the chief complaint within the history of present illness.  Patient denies  fevers or chills.   Assessment & Plan: Visit Diagnoses:  1. Right arm pain     Plan: Impression is left knee arthritis.  Synvisc 1 injected today.  Patient also has right upper extremity radiculopathy which looks to be cervical spine related.  Refer to Dr. Ernestina Turner for C-spine ESI in the face of right-sided radiculopathy and normal shoulder exam.  Follow-Up Instructions: No follow-ups on file.   Orders:  Orders Placed This Encounter  Procedures   XR Cervical Spine 2 or 3 views   Ambulatory referral to Physical Medicine Rehab   No orders of the defined types were placed in this encounter.     Procedures: Large Joint Inj: L knee on 09/22/2021 7:44 AM Indications: pain, joint swelling and diagnostic evaluation Details: 18 G 1.5 in needle, superolateral approach  Arthrogram: No  Medications: 5 mL lidocaine 1 %; 48 mg Hylan 48 MG/6ML Outcome: tolerated well, no  immediate complications Procedure, treatment alternatives, risks and benefits explained, specific risks discussed. Consent was given by the patient. Immediately prior to procedure a time out was called to verify the correct patient, procedure, equipment, support staff and site/side marked as required. Patient was prepped and draped in the usual sterile fashion.      Clinical Data: No additional findings.  Objective: Vital Signs: There were no vitals taken for this visit.  Physical Exam:   Constitutional: Patient appears well-developed HEENT:  Head: Normocephalic Eyes:EOM are normal Neck: Normal range of motion Cardiovascular: Normal rate Pulmonary/chest: Effort normal Neurologic: Patient is alert Skin: Skin is warm Psychiatric: Patient has normal mood and affect   Ortho Exam: Ortho exam demonstrates good flexion extension rotation of the cervical spine.  5 out of 5 grip EPL FPL interosseous resection extension bicep triceps and deltoid strength.  Right shoulder has reasonable external rotation of 50 degrees of AB duction to about 45 to 50 degrees.  Rotator cuff strength is intact.  No definite paresthesias C5-T1.  Radial pulse intact bilaterally.  No coarse grinding or crepitus in that right shoulder with internal and external rotation at 90 degrees of abduction. Specialty Comments:  No specialty comments available.  Imaging: No results found.   PMFS History: Patient Active Problem List  Diagnosis Date Noted   Hyperlipidemia 04/10/2020   Rib fracture 02/09/2019   Macula-off rhegmatogenous retinal detachment 04/19/2016   Dry eye syndrome 08/20/2013   Nuclear sclerotic cataract 08/20/2013   Pseudoaphakia 08/20/2013   Detached retina 08/20/2013   Past Medical History:  Diagnosis Date   Acne rosacea    Arthritis    BPH (benign prostatic hypertrophy)    Clavicle fracture    left   Detached retina    left    Detached retina, left 2011   Fx ankle    left   GERD  (gastroesophageal reflux disease)    Hyperlipidemia    Hypertension    Hypothyroidism    OSA (obstructive sleep apnea)    mild   Seasonal allergies    Sleep apnea    Weakness of both lower extremities     Family History  Problem Relation Age of Onset   CAD Mother    CAD Father    Breast cancer Sister    Hypertension Brother    Lung cancer Brother    Hypertension Brother    Parkinson's disease Brother     Past Surgical History:  Procedure Laterality Date   CATARACT EXTRACTION  08/2011   left, right 2016   detached retiina  2011   left, 2014   EYE SURGERY     lasix   HERNIA REPAIR Right    ing   TONSILLECTOMY     TOTAL SHOULDER REPLACEMENT Left 2021   Social History   Occupational History   Not on file  Tobacco Use   Smoking status: Never   Smokeless tobacco: Never  Substance and Sexual Activity   Alcohol use: Yes    Alcohol/week: 6.0 - 10.0 standard drinks    Types: 6 - 10 Standard drinks or equivalent per week    Comment: 2-3 daily, 04/26/21 15-20 weekly   Drug use: Never   Sexual activity: Not on file

## 2021-10-04 DIAGNOSIS — N400 Enlarged prostate without lower urinary tract symptoms: Secondary | ICD-10-CM | POA: Diagnosis not present

## 2021-10-04 DIAGNOSIS — M1712 Unilateral primary osteoarthritis, left knee: Secondary | ICD-10-CM | POA: Diagnosis not present

## 2021-10-04 DIAGNOSIS — E039 Hypothyroidism, unspecified: Secondary | ICD-10-CM | POA: Diagnosis not present

## 2021-10-04 DIAGNOSIS — E7800 Pure hypercholesterolemia, unspecified: Secondary | ICD-10-CM | POA: Diagnosis not present

## 2021-10-04 DIAGNOSIS — M1611 Unilateral primary osteoarthritis, right hip: Secondary | ICD-10-CM | POA: Diagnosis not present

## 2021-10-04 DIAGNOSIS — K219 Gastro-esophageal reflux disease without esophagitis: Secondary | ICD-10-CM | POA: Diagnosis not present

## 2021-10-04 DIAGNOSIS — I2510 Atherosclerotic heart disease of native coronary artery without angina pectoris: Secondary | ICD-10-CM | POA: Diagnosis not present

## 2021-10-04 DIAGNOSIS — I10 Essential (primary) hypertension: Secondary | ICD-10-CM | POA: Diagnosis not present

## 2021-10-06 DIAGNOSIS — I10 Essential (primary) hypertension: Secondary | ICD-10-CM | POA: Diagnosis not present

## 2021-10-12 DIAGNOSIS — R350 Frequency of micturition: Secondary | ICD-10-CM | POA: Diagnosis not present

## 2021-10-12 DIAGNOSIS — M25551 Pain in right hip: Secondary | ICD-10-CM | POA: Diagnosis not present

## 2021-10-12 DIAGNOSIS — R300 Dysuria: Secondary | ICD-10-CM | POA: Diagnosis not present

## 2021-10-12 DIAGNOSIS — M6281 Muscle weakness (generalized): Secondary | ICD-10-CM | POA: Diagnosis not present

## 2021-10-19 ENCOUNTER — Ambulatory Visit: Payer: Self-pay

## 2021-10-19 ENCOUNTER — Other Ambulatory Visit: Payer: Self-pay

## 2021-10-19 ENCOUNTER — Ambulatory Visit (INDEPENDENT_AMBULATORY_CARE_PROVIDER_SITE_OTHER): Payer: Medicare Other | Admitting: Physical Medicine and Rehabilitation

## 2021-10-19 VITALS — BP 156/78 | HR 78

## 2021-10-19 DIAGNOSIS — M5412 Radiculopathy, cervical region: Secondary | ICD-10-CM

## 2021-10-19 DIAGNOSIS — I10 Essential (primary) hypertension: Secondary | ICD-10-CM | POA: Diagnosis not present

## 2021-10-19 MED ORDER — METHYLPREDNISOLONE ACETATE 80 MG/ML IJ SUSP
80.0000 mg | Freq: Once | INTRAMUSCULAR | Status: AC
  Administered 2021-10-19: 16:00:00 80 mg

## 2021-10-19 NOTE — Patient Instructions (Signed)

## 2021-10-19 NOTE — Progress Notes (Signed)
Pt state neck pain that travels to his right arm. Pt state turning his head makes the pain worse. Pt state he takes over the counter pain meds and uses heat / ice to help ease his pain.  Numeric Pain Rating Scale and Functional Assessment Average Pain 5   In the last MONTH (on 0-10 scale) has pain interfered with the following?  1. General activity like being  able to carry out your everyday physical activities such as walking, climbing stairs, carrying groceries, or moving a chair?  Rating(8)   +Driver, -BT, -Dye Allergies.  

## 2021-10-26 NOTE — Procedures (Signed)
Cervical Epidural Steroid Injection - Interlaminar Approach with Fluoroscopic Guidance  Patient: Rick Turner      Date of Birth: Jul 21, 1951 MRN: [id number] PCP: [name], MD      Visit Date: 10/19/2021   Universal Protocol:    Date/Time: 12/20/226:22 AM  Consent Given By: the patient  Position: PRONE  Additional Comments: Vital signs were monitored before and after the procedure. Patient was prepped and draped in the usual sterile fashion. The correct patient, procedure, and site was verified.   Injection Procedure Details:   Procedure diagnoses: Cervical radiculopathy [M54.12]    Meds Administered:  Meds ordered this encounter  Medications   methylPREDNISolone acetate (DEPO-MEDROL) injection 80 mg     Laterality: Right  Location/Site: C7-T1  Needle: 3.5 in., 20 ga. Tuohy  Needle Placement: Paramedian epidural space  Findings:  -Comments: Excellent flow of contrast into the epidural space.  Procedure Details: Using a paramedian approach from the side mentioned above, the region overlying the inferior lamina was localized under fluoroscopic visualization and the soft tissues overlying this structure were infiltrated with 4 ml. of 1% Lidocaine without Epinephrine. A # 20 gauge, Tuohy needle was inserted into the epidural space using a paramedian approach.  The epidural space was localized using loss of resistance along with contralateral oblique bi-planar fluoroscopic views.  After negative aspirate for air, blood, and CSF, a 2 ml. volume of Isovue-250 was injected into the epidural space and the flow of contrast was observed. Radiographs were obtained for documentation purposes.   The injectate was administered into the level noted above.  Additional Comments:  The patient tolerated the procedure well Dressing: 2 x 2 sterile gauze and Band-Aid    Post-procedure details: Patient was observed during the procedure. Post-procedure instructions were  reviewed.  Patient left the clinic in stable condition.

## 2021-10-26 NOTE — Progress Notes (Signed)
Yosgar Demirjian - 70 y.o. male MRN [id number]  Date of birth: 1950-12-09  Office Visit Note: Visit Date: 10/19/2021 PCP: [name], MD Referred by: [name], MD  Subjective: Chief Complaint  Patient presents with   Neck - Pain   Right Shoulder - Pain   Right Arm - Pain   HPI:  Siraj Dermody is a 70 y.o. male who comes in today at the request of Dr. Anderson Malta for planned Right C7-T1 Cervical Interlaminar epidural steroid injection with fluoroscopic guidance.  The patient has failed conservative care including home exercise, medications, time and activity modification.  This injection will be diagnostic and hopefully therapeutic.  Please see requesting physician notes for further details and justification. MRI reviewed with images and spine model.  MRI reviewed in the note below.   ROS Otherwise per HPI.  Assessment & Plan: Visit Diagnoses:    ICD-10-CM   1. Cervical radiculopathy  M54.12 XR C-ARM NO REPORT    Epidural Steroid injection    methylPREDNISolone acetate (DEPO-MEDROL) injection 80 mg      Plan: No additional findings.   Meds & Orders:  Meds ordered this encounter  Medications   methylPREDNISolone acetate (DEPO-MEDROL) injection 80 mg    Orders Placed This Encounter  Procedures   XR C-ARM NO REPORT   Epidural Steroid injection    Follow-up: Return for visit to requesting provider as needed.   Procedures: No procedures performed  Cervical Epidural Steroid Injection - Interlaminar Approach with Fluoroscopic Guidance  Patient: Duvid Smalls      Date of Birth: [birth year] MRN: [id number] PCP: [name], MD      Visit Date: 10/19/2021   Universal Protocol:    Date/Time: 12/20/226:22 AM  Consent Given By: the patient  Position: PRONE  Additional Comments: Vital signs were monitored before and after the procedure. Patient was prepped and draped in the usual sterile fashion. The correct patient, procedure, and site was  verified.   Injection Procedure Details:   Procedure diagnoses: Cervical radiculopathy [M54.12]    Meds Administered:  Meds ordered this encounter  Medications   methylPREDNISolone acetate (DEPO-MEDROL) injection 80 mg     Laterality: Right  Location/Site: C7-T1  Needle: 3.5 in., 20 ga. Tuohy  Needle Placement: Paramedian epidural space  Findings:  -Comments: Excellent flow of contrast into the epidural space.  Procedure Details: Using a paramedian approach from the side mentioned above, the region overlying the inferior lamina was localized under fluoroscopic visualization and the soft tissues overlying this structure were infiltrated with 4 ml. of 1% Lidocaine without Epinephrine. A # 20 gauge, Tuohy needle was inserted into the epidural space using a paramedian approach.  The epidural space was localized using loss of resistance along with contralateral oblique bi-planar fluoroscopic views.  After negative aspirate for air, blood, and CSF, a 2 ml. volume of Isovue-250 was injected into the epidural space and the flow of contrast was observed. Radiographs were obtained for documentation purposes.   The injectate was administered into the level noted above.  Additional Comments:  The patient tolerated the procedure well Dressing: 2 x 2 sterile gauze and Band-Aid    Post-procedure details: Patient was observed during the procedure. Post-procedure instructions were reviewed.  Patient left the clinic in stable condition.   Clinical History: STUDY DATE: 05/26/2021 PATIENT NAME: Yoshiaki Kreuser DOB: Sep 04, 1951 MRN: [id number]   EXAM: MRI of the cervical spine with and without contrast   ORDERING CLINICIAN: Andrey Spearman MD CLINICAL HISTORY: 70 year old man with  numbness and weakness COMPARISON FILMS: None   TECHNIQUE: MRI of the cervical spine was obtained utilizing 3 mm sagittal slices from the posterior fossa down to the T3-4 level with T1, T2 and inversion  recovery views. In addition 4 mm axial slices from Z6-1 down to T1-2 level were included with T2 and gradient echo views.  After the infusion of contrast, additional T1-weighted images were performed. CONTRAST: 20 mL MultiHance IMAGING SITE: [facility], [place], [place], [place]   FINDINGS: :  On sagittal images, the spine is imaged from above the cervicomedullary junction to T2.  Visible brain appears normal.  Paravertebral soft tissue appears normal.  The spinal cord is of normal caliber and signal.   2 mm retrolisthesis of C4 upon C5.Marland Kitchen   Endplate degenerative changes are noted at C6-C7.  Otherwise bone marrow is normal.   The discs and interspaces were further evaluated on axial views from C2 to T1 as follows: C2 - C3:  The disc and interspace appear normal. C3 - C4: There is minimal uncovertebral spurring to the right.  Mild right foraminal narrowing but no nerve root compression or spinal stenosis.. C4 - C5: There is moderate loss of disc height associated with 2 mm retrolisthesis, disc bulging, uncovertebral spurring and right facet hypertrophy.  There is mild spinal stenosis and moderate right foraminal narrowing and mild left foraminal narrowing.  There is no definite nerve root compression. C5 - C6: There is mild reduced disc height, disc bulging causing borderline spinal stenosis and mild right and mild to moderate left foraminal narrowing.  There is no nerve root compression. C6 - C7: There is mild reduced disc height, endplate degenerative changes, disc bulging and facet hypertrophy.  There is no minimal foraminal narrowing.  No spinal stenosis or nerve root compression. C7 - T1: Minimal disc bulging but no spinal stenosis or nerve root compression.   After the infusion contrast, normal enhancement pattern was observed.   IMPRESSION: This MRI of the cervical spine with and without contrast shows the following: 1.   The spinal cord appears normal. 2.   At C4-C5, there is  mild spinal stenosis due to minimal retrolisthesis and other degenerative changes.  There is moderate right foraminal narrowing and mild left foraminal narrowing.  There does not appear to be nerve root compression. 3.   At C5-C6, there is borderline spinal stenosis due to degenerative changes.  There is mild to moderate left foraminal narrowing but no nerve root compression. 4.   At C6-C7, there are degenerative changes and endplate degenerative changes but no nerve root compression or spinal stenosis. 5.   Normal enhancement pattern.     Objective:  VS:  HT:     WT:    BMI:      BP:(!) 156/78   HR:78bpm   TEMP: ( )   RESP:  Physical Exam Vitals and nursing note reviewed.  Constitutional:      General: He is not in acute distress.    Appearance: Normal appearance. He is not ill-appearing.  HENT:     Head: Normocephalic and atraumatic.     Right Ear: External ear normal.     Left Ear: External ear normal.  Eyes:     Extraocular Movements: Extraocular movements intact.  Cardiovascular:     Rate and Rhythm: Normal rate.     Pulses: Normal pulses.  Abdominal:     General: There is no distension.     Palpations: Abdomen is soft.  Musculoskeletal:  General: No signs of injury.     Cervical back: Neck supple. Tenderness present. No rigidity.     Right lower leg: No edema.     Left lower leg: No edema.     Comments: Patient has good strength in the upper extremities with 5 out of 5 strength in wrist extension long finger flexion APB.  No intrinsic hand muscle atrophy.  Negative Hoffmann's test.  Lymphadenopathy:     Cervical: No cervical adenopathy.  Skin:    Findings: No erythema or rash.  Neurological:     General: No focal deficit present.     Mental Status: He is alert and oriented to person, place, and time.     Sensory: No sensory deficit.     Motor: No weakness or abnormal muscle tone.     Coordination: Coordination normal.  Psychiatric:        Mood and Affect: Mood  normal.        Behavior: Behavior normal.     Imaging: No results found.

## 2021-10-28 ENCOUNTER — Encounter: Payer: Self-pay | Admitting: Physical Medicine and Rehabilitation

## 2021-11-01 DIAGNOSIS — Z6831 Body mass index (BMI) 31.0-31.9, adult: Secondary | ICD-10-CM | POA: Diagnosis not present

## 2021-11-01 DIAGNOSIS — J069 Acute upper respiratory infection, unspecified: Secondary | ICD-10-CM | POA: Diagnosis not present

## 2021-11-01 DIAGNOSIS — R070 Pain in throat: Secondary | ICD-10-CM | POA: Diagnosis not present

## 2021-11-01 DIAGNOSIS — R0981 Nasal congestion: Secondary | ICD-10-CM | POA: Diagnosis not present

## 2021-11-03 DIAGNOSIS — U071 COVID-19: Secondary | ICD-10-CM | POA: Diagnosis not present

## 2021-11-04 ENCOUNTER — Telehealth: Payer: Self-pay | Admitting: Physical Medicine and Rehabilitation

## 2021-11-04 NOTE — Telephone Encounter (Signed)
Error

## 2021-11-05 DIAGNOSIS — I10 Essential (primary) hypertension: Secondary | ICD-10-CM | POA: Diagnosis not present

## 2021-11-11 ENCOUNTER — Other Ambulatory Visit: Payer: Self-pay

## 2021-11-11 DIAGNOSIS — K4090 Unilateral inguinal hernia, without obstruction or gangrene, not specified as recurrent: Secondary | ICD-10-CM | POA: Diagnosis not present

## 2021-11-11 DIAGNOSIS — R29898 Other symptoms and signs involving the musculoskeletal system: Secondary | ICD-10-CM | POA: Diagnosis not present

## 2021-11-11 DIAGNOSIS — E785 Hyperlipidemia, unspecified: Secondary | ICD-10-CM

## 2021-11-11 DIAGNOSIS — K4091 Unilateral inguinal hernia, without obstruction or gangrene, recurrent: Secondary | ICD-10-CM | POA: Diagnosis not present

## 2021-11-17 DIAGNOSIS — M25551 Pain in right hip: Secondary | ICD-10-CM | POA: Diagnosis not present

## 2021-11-17 DIAGNOSIS — R300 Dysuria: Secondary | ICD-10-CM | POA: Diagnosis not present

## 2021-11-17 DIAGNOSIS — M6281 Muscle weakness (generalized): Secondary | ICD-10-CM | POA: Diagnosis not present

## 2021-11-17 DIAGNOSIS — R350 Frequency of micturition: Secondary | ICD-10-CM | POA: Diagnosis not present

## 2021-11-23 ENCOUNTER — Ambulatory Visit (INDEPENDENT_AMBULATORY_CARE_PROVIDER_SITE_OTHER): Payer: Medicare Other | Admitting: Physical Medicine and Rehabilitation

## 2021-11-23 ENCOUNTER — Ambulatory Visit: Payer: Medicare Other

## 2021-11-23 ENCOUNTER — Other Ambulatory Visit: Payer: Self-pay

## 2021-11-23 ENCOUNTER — Encounter: Payer: Self-pay | Admitting: Physical Medicine and Rehabilitation

## 2021-11-23 VITALS — BP 127/77 | HR 60

## 2021-11-23 DIAGNOSIS — M542 Cervicalgia: Secondary | ICD-10-CM

## 2021-11-23 DIAGNOSIS — M5412 Radiculopathy, cervical region: Secondary | ICD-10-CM | POA: Diagnosis not present

## 2021-11-23 DIAGNOSIS — G8929 Other chronic pain: Secondary | ICD-10-CM | POA: Diagnosis not present

## 2021-11-23 DIAGNOSIS — M25511 Pain in right shoulder: Secondary | ICD-10-CM

## 2021-11-23 DIAGNOSIS — M7918 Myalgia, other site: Secondary | ICD-10-CM

## 2021-11-23 MED ORDER — METHYLPREDNISOLONE ACETATE 80 MG/ML IJ SUSP
80.0000 mg | Freq: Once | INTRAMUSCULAR | Status: AC
  Administered 2021-11-23: 80 mg

## 2021-11-23 NOTE — Progress Notes (Signed)
Pt state neck pain that travels to his right arm. Pt state turning his head makes the pain worse. Pt state he takes over the counter pain meds and uses heat / ice to help ease his pain.  Numeric Pain Rating Scale and Functional Assessment Average Pain 5   In the last MONTH (on 0-10 scale) has pain interfered with the following?  1. General activity like being  able to carry out your everyday physical activities such as walking, climbing stairs, carrying groceries, or moving a chair?  Rating(9)   +Driver, -BT, -Dye Allergies.

## 2021-11-23 NOTE — Patient Instructions (Signed)

## 2021-11-24 DIAGNOSIS — E785 Hyperlipidemia, unspecified: Secondary | ICD-10-CM | POA: Diagnosis not present

## 2021-11-24 NOTE — Procedures (Signed)
Cervical Epidural Steroid Injection - Interlaminar Approach with Fluoroscopic Guidance  Patient: Rick Turner      Date of Birth: [birth year] MRN: [id number] PCP: [name], MD      Visit Date: 11/23/2021   Universal Protocol:    Date/Time: 01/18/235:53 AM  Consent Given By: the patient  Position: PRONE  Additional Comments: Vital signs were monitored before and after the procedure. Patient was prepped and draped in the usual sterile fashion. The correct patient, procedure, and site was verified.   Injection Procedure Details:   Procedure diagnoses: Cervical radiculopathy [M54.12]    Meds Administered:  Meds ordered this encounter  Medications   methylPREDNISolone acetate (DEPO-MEDROL) injection 80 mg     Laterality: Right  Location/Site: C7-T1  Needle: 3.5 in., 20 ga. Tuohy  Needle Placement: Paramedian epidural space  Findings:  -Comments: Excellent flow of contrast into the epidural space.  Procedure Details: Using a paramedian approach from the side mentioned above, the region overlying the inferior lamina was localized under fluoroscopic visualization and the soft tissues overlying this structure were infiltrated with 4 ml. of 1% Lidocaine without Epinephrine. A # 20 gauge, Tuohy needle was inserted into the epidural space using a paramedian approach.  The epidural space was localized using loss of resistance along with contralateral oblique bi-planar fluoroscopic views.  After negative aspirate for air, blood, and CSF, a 2 ml. volume of Isovue-250 was injected into the epidural space and the flow of contrast was observed. Radiographs were obtained for documentation purposes.   The injectate was administered into the level noted above.  Additional Comments:  The patient tolerated the procedure well Dressing: 2 x 2 sterile gauze and Band-Aid    Post-procedure details: Patient was observed during the procedure. Post-procedure instructions were  reviewed.  Patient left the clinic in stable condition.

## 2021-11-24 NOTE — Progress Notes (Signed)
Harmon Bommarito - 71 y.o. male MRN [id number]  Date of birth: [birth year]  Office Visit Note: Visit Date: 11/23/2021 PCP: [name], MD Referred by: [name], MD  Subjective: Chief Complaint  Patient presents with   Neck - Pain   Right Arm - Pain   HPI: Osmel Dykstra is a 71 y.o. male who comes in today for planned repeat Right C7-T1  Cervical Interlaminar epidural steroid injection with fluoroscopic guidance.  The patient has failed conservative care including home exercise, medications, time and activity modification.  This injection will be diagnostic and hopefully therapeutic.  Please see requesting physician notes for further details and justification. Patient received more than 50% pain relief from prior injection.  Patient also reports recent experience with 1 session of dry needling from an independent physical therapist and that seemed to help as well.  This did seem to help shoulder pain as well as neck pain.  Evaluation of the looking at myofascial pain syndrome.  His symptoms are consistent with both potential for radicular pain but also myofascial pain.  He has no red flag symptoms.  I have gone ahead today and put a referral into physical therapy in our office for manual treatment and reevaluation and management and possible dry needling of his cervical spine and upper back.  Referring: Dr. Anderson Malta      Review of Systems  Constitutional:  Negative for chills, fever, malaise/fatigue and weight loss.  HENT:  Negative for hearing loss and sinus pain.   Eyes:  Negative for blurred vision, double vision and photophobia.  Respiratory:  Negative for cough and shortness of breath.   Cardiovascular:  Negative for chest pain, palpitations and leg swelling.  Gastrointestinal:  Negative for abdominal pain, nausea and vomiting.  Genitourinary:  Negative for flank pain.  Musculoskeletal:  Positive for joint pain and neck pain. Negative for myalgias.  Skin:  Negative  for itching and rash.  Neurological:  Negative for tremors, focal weakness and weakness.  Endo/Heme/Allergies: Negative.   Psychiatric/Behavioral:  Negative for depression.   All other systems reviewed and are negative. Otherwise per HPI.  Assessment & Plan: Visit Diagnoses:    ICD-10-CM   1. Cervical radiculopathy  M54.12 XR C-ARM NO REPORT    Epidural Steroid injection    methylPREDNISolone acetate (DEPO-MEDROL) injection 80 mg    Ambulatory referral to Physical Therapy    2. Cervicalgia  M54.2 Ambulatory referral to Physical Therapy    3. Myofascial pain syndrome  M79.18 Ambulatory referral to Physical Therapy    4. Chronic right shoulder pain  M25.511    G89.29        Plan: Findings:  From a cervical radiculopathy standpoint he did seem to get relief with the epidural injection so we did repeat that.  He continues to follow with Dr. Anderson Malta for his neck and shoulder.  He does have some mechanical complaints of myofascial pain as well as documented.  Did make referral to physical therapy today for dry needling and mechanical treatment.   Meds & Orders:  Meds ordered this encounter  Medications   methylPREDNISolone acetate (DEPO-MEDROL) injection 80 mg    Orders Placed This Encounter  Procedures   XR C-ARM NO REPORT   Ambulatory referral to Physical Therapy   Epidural Steroid injection    Follow-up: Return for visit to requesting provider as needed.   Procedures: No procedures performed  Cervical Epidural Steroid Injection - Interlaminar Approach with Fluoroscopic Guidance  Patient: Sparsh  Traynham      Date of Birth: 1950/12/06 MRN: [id number] PCP: [name], MD      Visit Date: 11/23/2021   Universal Protocol:    Date/Time: 01/18/235:53 AM  Consent Given By: the patient  Position: PRONE  Additional Comments: Vital signs were monitored before and after the procedure. Patient was prepped and draped in the usual sterile fashion. The correct  patient, procedure, and site was verified.   Injection Procedure Details:   Procedure diagnoses: Cervical radiculopathy [M54.12]    Meds Administered:  Meds ordered this encounter  Medications   methylPREDNISolone acetate (DEPO-MEDROL) injection 80 mg     Laterality: Right  Location/Site: C7-T1  Needle: 3.5 in., 20 ga. Tuohy  Needle Placement: Paramedian epidural space  Findings:  -Comments: Excellent flow of contrast into the epidural space.  Procedure Details: Using a paramedian approach from the side mentioned above, the region overlying the inferior lamina was localized under fluoroscopic visualization and the soft tissues overlying this structure were infiltrated with 4 ml. of 1% Lidocaine without Epinephrine. A # 20 gauge, Tuohy needle was inserted into the epidural space using a paramedian approach.  The epidural space was localized using loss of resistance along with contralateral oblique bi-planar fluoroscopic views.  After negative aspirate for air, blood, and CSF, a 2 ml. volume of Isovue-250 was injected into the epidural space and the flow of contrast was observed. Radiographs were obtained for documentation purposes.   The injectate was administered into the level noted above.  Additional Comments:  The patient tolerated the procedure well Dressing: 2 x 2 sterile gauze and Band-Aid    Post-procedure details: Patient was observed during the procedure. Post-procedure instructions were reviewed.  Patient left the clinic in stable condition.   Clinical History: STUDY DATE: 05/26/2021 PATIENT NAME: Tanor Glaspy DOB: [birth year] MRN: [id number]   EXAM: MRI of the cervical spine with and without contrast   ORDERING CLINICIAN: Andrey Spearman MD CLINICAL HISTORY: 71 year old man with numbness and weakness COMPARISON FILMS: None   TECHNIQUE: MRI of the cervical spine was obtained utilizing 3 mm sagittal slices from the posterior fossa down to the T3-4  level with T1, T2 and inversion recovery views. In addition 4 mm axial slices from Y7-0 down to T1-2 level were included with T2 and gradient echo views.  After the infusion of contrast, additional T1-weighted images were performed. CONTRAST: 20 mL MultiHance IMAGING SITE: [facility], [place], [place], [place]   FINDINGS: :  On sagittal images, the spine is imaged from above the cervicomedullary junction to T2.  Visible brain appears normal.  Paravertebral soft tissue appears normal.  The spinal cord is of normal caliber and signal.   2 mm retrolisthesis of C4 upon C5.Marland Kitchen   Endplate degenerative changes are noted at C6-C7.  Otherwise bone marrow is normal.   The discs and interspaces were further evaluated on axial views from C2 to T1 as follows: C2 - C3:  The disc and interspace appear normal. C3 - C4: There is minimal uncovertebral spurring to the right.  Mild right foraminal narrowing but no nerve root compression or spinal stenosis.. C4 - C5: There is moderate loss of disc height associated with 2 mm retrolisthesis, disc bulging, uncovertebral spurring and right facet hypertrophy.  There is mild spinal stenosis and moderate right foraminal narrowing and mild left foraminal narrowing.  There is no definite nerve root compression. C5 - C6: There is mild reduced disc height, disc bulging causing borderline spinal stenosis and mild  right and mild to moderate left foraminal narrowing.  There is no nerve root compression. C6 - C7: There is mild reduced disc height, endplate degenerative changes, disc bulging and facet hypertrophy.  There is no minimal foraminal narrowing.  No spinal stenosis or nerve root compression. C7 - T1: Minimal disc bulging but no spinal stenosis or nerve root compression.   After the infusion contrast, normal enhancement pattern was observed.   IMPRESSION: This MRI of the cervical spine with and without contrast shows the following: 1.   The spinal cord appears  normal. 2.   At C4-C5, there is mild spinal stenosis due to minimal retrolisthesis and other degenerative changes.  There is moderate right foraminal narrowing and mild left foraminal narrowing.  There does not appear to be nerve root compression. 3.   At C5-C6, there is borderline spinal stenosis due to degenerative changes.  There is mild to moderate left foraminal narrowing but no nerve root compression. 4.   At C6-C7, there are degenerative changes and endplate degenerative changes but no nerve root compression or spinal stenosis. 5.   Normal enhancement pattern.   He reports that he has never smoked. He has never used smokeless tobacco.  Recent Labs    05/13/21 1439  HGBA1C 5.4    Objective:  VS:  HT:     WT:    BMI:      BP:127/77   HR:60bpm   TEMP: ( )   RESP:  Physical Exam Vitals and nursing note reviewed.  Constitutional:      General: He is not in acute distress.    Appearance: Normal appearance. He is not ill-appearing.  HENT:     Head: Normocephalic and atraumatic.     Right Ear: External ear normal.     Left Ear: External ear normal.  Eyes:     Extraocular Movements: Extraocular movements intact.  Cardiovascular:     Rate and Rhythm: Normal rate.     Pulses: Normal pulses.  Abdominal:     General: There is no distension.     Palpations: Abdomen is soft.  Musculoskeletal:        General: No signs of injury.     Cervical back: Neck supple. Tenderness present. No rigidity.     Right lower leg: No edema.     Left lower leg: No edema.     Comments: Patient has good strength in the upper extremities with 5 out of 5 strength in wrist extension long finger flexion APB.  No intrinsic hand muscle atrophy.  Negative Hoffmann's test.  Trigger points palpated with concordant pain in the trapezius levator scapula and rhomboids bilaterally right more than left.  Lymphadenopathy:     Cervical: No cervical adenopathy.  Skin:    Findings: No erythema or rash.  Neurological:      General: No focal deficit present.     Mental Status: He is alert and oriented to person, place, and time.     Sensory: No sensory deficit.     Motor: No weakness or abnormal muscle tone.     Coordination: Coordination normal.  Psychiatric:        Mood and Affect: Mood normal.        Behavior: Behavior normal.    Ortho Exam  Imaging: XR C-ARM NO REPORT  Result Date: 11/23/2021 Please see Notes tab for imaging impression.   Past Medical/Family/Surgical/Social History: Medications & Allergies reviewed per EMR, new medications updated. Patient Active Problem List   Diagnosis  Date Noted   Hyperlipidemia 04/10/2020   Rib fracture 02/09/2019   Macula-off rhegmatogenous retinal detachment 04/19/2016   Dry eye syndrome 08/20/2013   Nuclear sclerotic cataract 08/20/2013   Pseudoaphakia 08/20/2013   Detached retina 08/20/2013   Past Medical History:  Diagnosis Date   Acne rosacea    Arthritis    BPH (benign prostatic hypertrophy)    Clavicle fracture    left   Detached retina    left    Detached retina, left 2011   Fx ankle    left   GERD (gastroesophageal reflux disease)    Hyperlipidemia    Hypertension    Hypothyroidism    OSA (obstructive sleep apnea)    mild   Seasonal allergies    Sleep apnea    Weakness of both lower extremities    Family History  Problem Relation Age of Onset   CAD Mother    CAD Father    Breast cancer Sister    Hypertension Brother    Lung cancer Brother    Hypertension Brother    Parkinson's disease Brother    Past Surgical History:  Procedure Laterality Date   CATARACT EXTRACTION  08/2011   left, right 2016   detached retiina  2011   left, 2014   EYE SURGERY     lasix   HERNIA REPAIR Right    ing   TONSILLECTOMY     TOTAL SHOULDER REPLACEMENT Left 2021   Social History   Occupational History   Not on file  Tobacco Use   Smoking status: Never   Smokeless tobacco: Never  Substance and Sexual Activity   Alcohol use: Yes     Alcohol/week: 6.0 - 10.0 standard drinks    Types: 6 - 10 Standard drinks or equivalent per week    Comment: 2-3 daily, 04/26/21 15-20 weekly   Drug use: Never   Sexual activity: Not on file

## 2021-11-25 LAB — LIPID PANEL
Chol/HDL Ratio: 3.6 ratio (ref 0.0–5.0)
Cholesterol, Total: 243 mg/dL — ABNORMAL HIGH (ref 100–199)
HDL: 68 mg/dL (ref >39)
LDL Chol Calc (NIH): 163 mg/dL — ABNORMAL HIGH (ref 0–99)
Triglycerides: 69 mg/dL (ref 0–149)
VLDL Cholesterol Cal: 12 mg/dL (ref 5–40)

## 2021-11-25 LAB — HEPATIC FUNCTION PANEL
ALT: 15 IU/L (ref 0–44)
AST: 16 IU/L (ref 0–40)
Albumin: 4.6 g/dL (ref 3.8–4.8)
Alkaline Phosphatase: 81 IU/L (ref 44–121)
Bilirubin Total: 0.3 mg/dL (ref 0.0–1.2)
Bilirubin, Direct: 0.12 mg/dL (ref 0.00–0.40)
Total Protein: 7.1 g/dL (ref 6.0–8.5)

## 2021-11-29 IMAGING — MR MR SHOULDER*L* W/ CM
6 series · 40 of 40 positions shown · IV contrast (agent unspecified)
Comparison: X-ray 07/31/2019

CLINICAL DATA: Chronic left shoulder pain

EXAM:
MR ARTHROGRAM OF THE LEFT SHOULDER
TECHNIQUE: Multiplanar, multisequence MR imaging of the left shoulder was
performed following the administration of intra-articular contrast.
CONTRAST:  See Injection Documentation.

[Series 3: T1 fat-sat · axial · 4.0mm · 0.27mm/px · z∈[-39,+46]mm · 6 of 18 slices shown (1 of 4)]
[im 1/18]
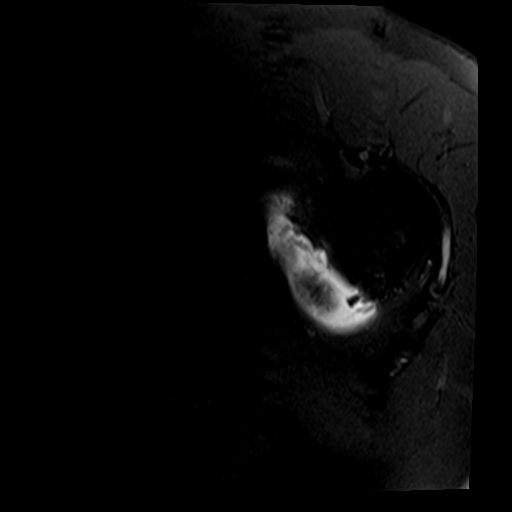
[im 4/18]
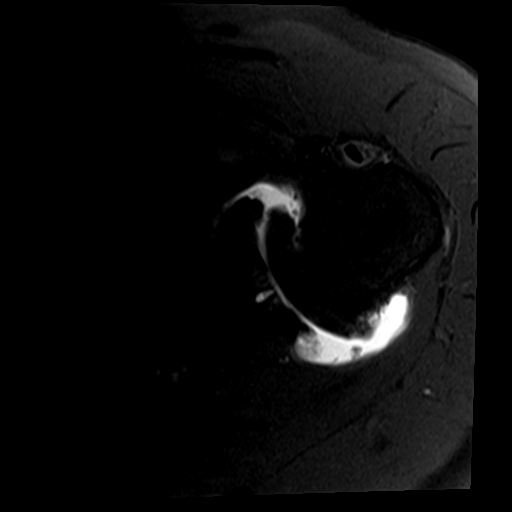
[im 7/18]
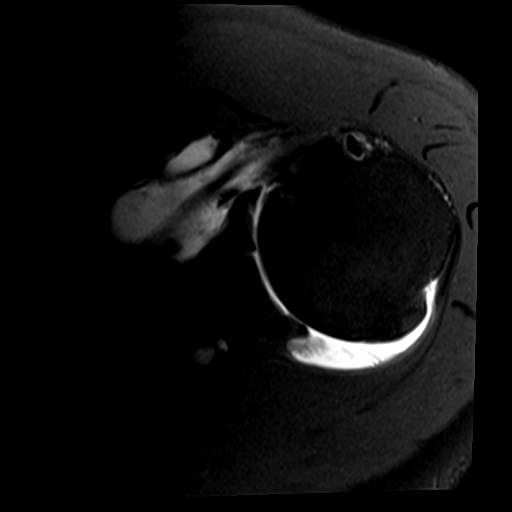
[im 11/18]
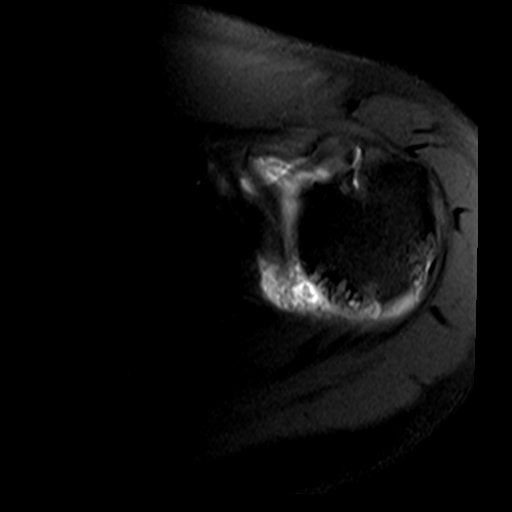
[im 14/18]
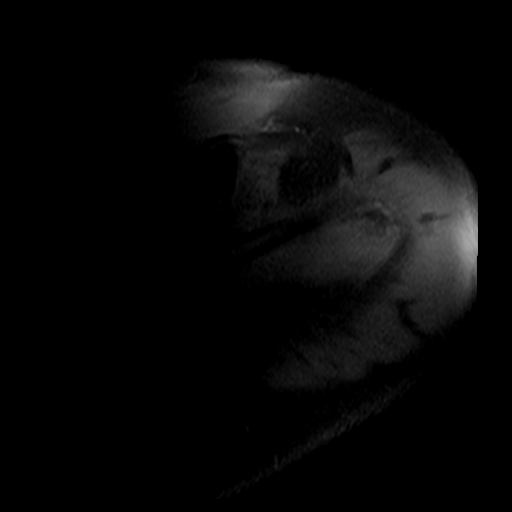
[im 18/18]
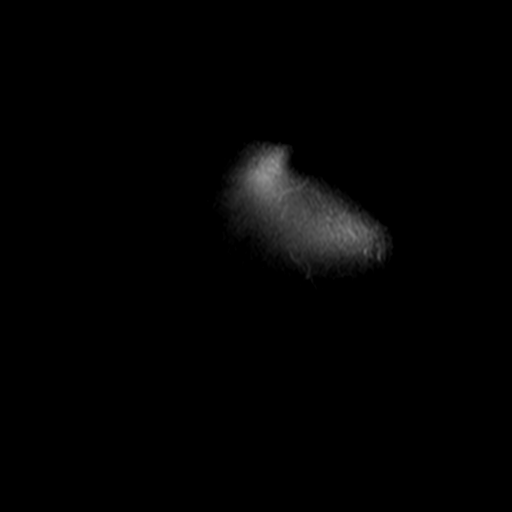

[Series 4: T2 fat-sat · sagittal · 4.0mm · 0.55mm/px · 7 of 20 slices shown (1 of 2)]
[im 1/20]
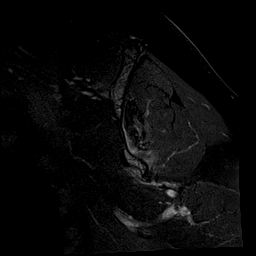
[im 4/20]
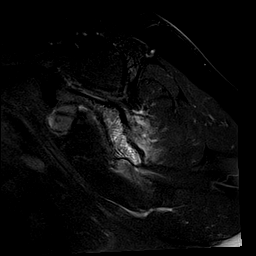
[im 7/20]
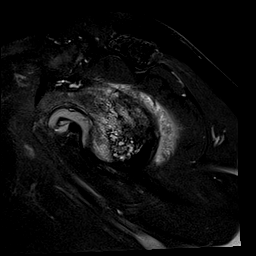
[im 10/20]
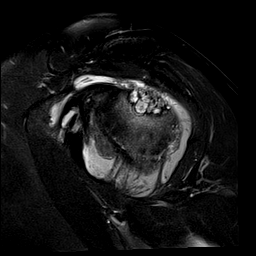
[im 13/20]
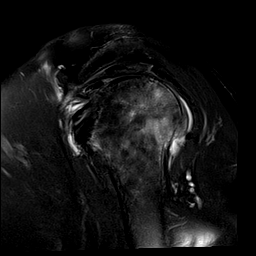
[im 16/20]
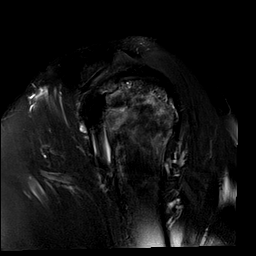
[im 20/20]
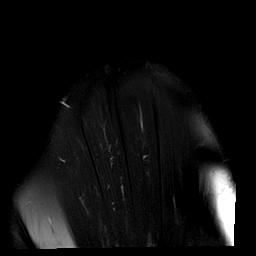

[Series 5: T1 fat-sat · oblique · 4.0mm · 0.55mm/px · 7 of 19 slices shown (2 of 4)]
[im 1/19]
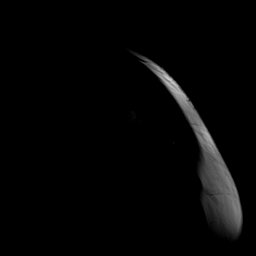
[im 4/19]
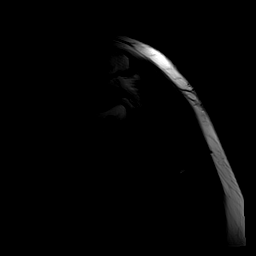
[im 7/19]
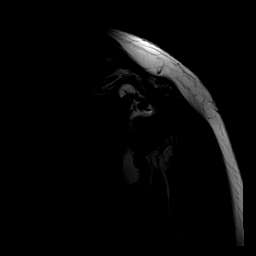
[im 10/19]
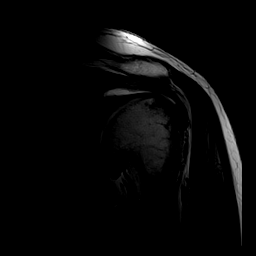
[im 13/19]
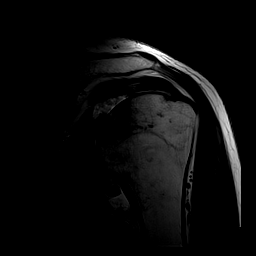
[im 16/19]
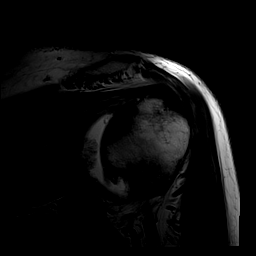
[im 19/19]
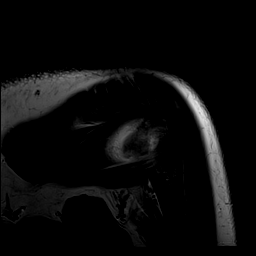

[Series 6: T1 fat-sat · oblique · 4.0mm · 0.55mm/px · 7 of 19 slices shown (3 of 4)]
[im 1/19]
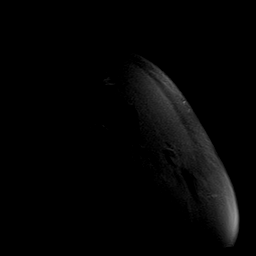
[im 4/19]
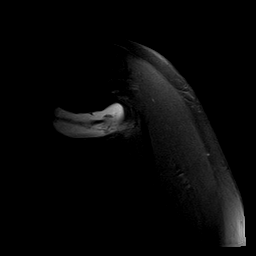
[im 7/19]
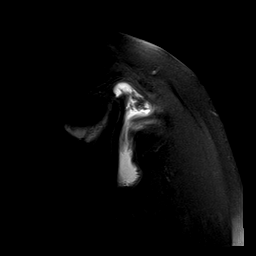
[im 10/19]
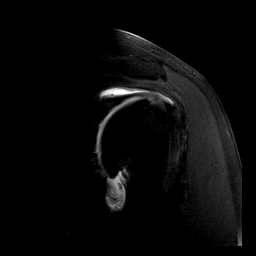
[im 13/19]
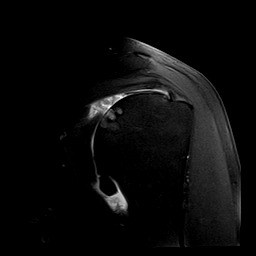
[im 16/19]
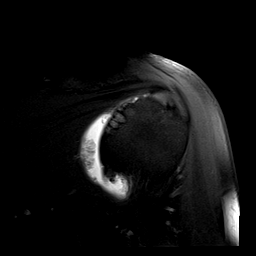
[im 19/19]
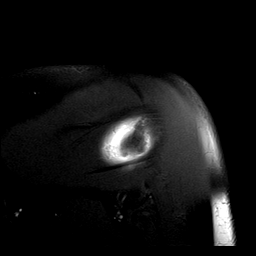

[Series 7: T2 fat-sat · oblique · 4.0mm · 0.55mm/px · 7 of 19 slices shown (2 of 2)]
[im 1/19]
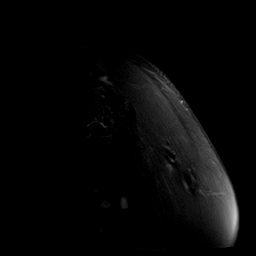
[im 4/19]
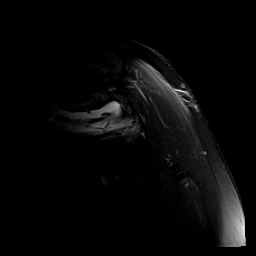
[im 7/19]
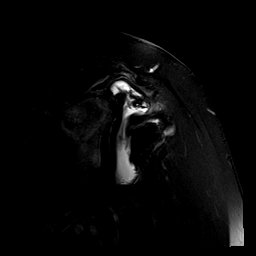
[im 10/19]
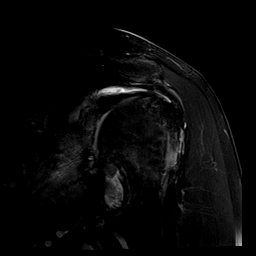
[im 13/19]
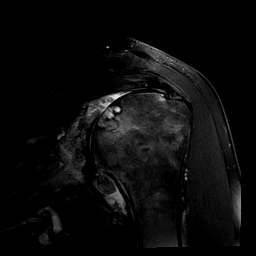
[im 16/19]
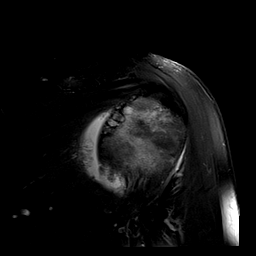
[im 19/19]
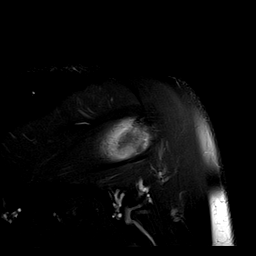

[Series 10: T1 fat-sat · sagittal · 4.0mm · 0.59mm/px · 6 of 16 slices shown (4 of 4)]
[im 1/16]
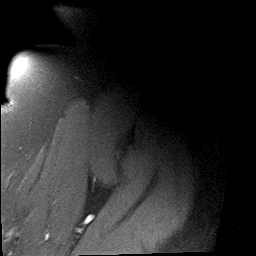
[im 4/16]
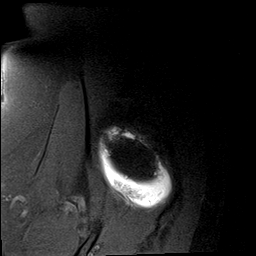
[im 7/16]
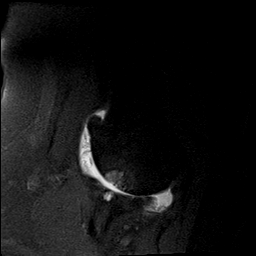
[im 10/16]
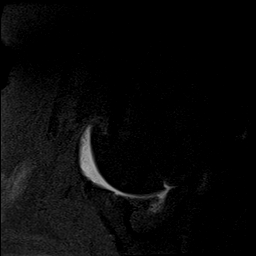
[im 13/16]
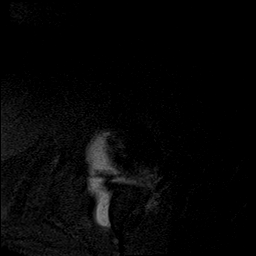
[im 16/16]
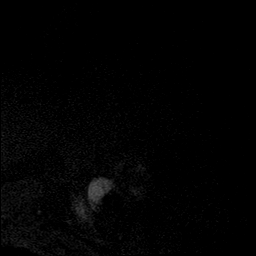

[40 of 40 positions shown; findings below may reference images not displayed]

FINDINGS: Rotator cuff: Supraspinatus tendinosis with partial-thickness tear
of the anterior and mid insertional fibers (series 7, images 7-9).
Additional low-grade articular sided tearing of the far anterior
supraspinatus tendon fibers. High-grade partial thickness tearing of
the superior aspect of the distal subscapularis tendon.
Infraspinatus and teres minor tendons intact.

Muscles: Unremarkable muscle bulk and signal intensity of the
rotator cuff musculature.

Biceps long head: Tendinosis and low-grade interstitial tearing of
the intra-articular portion of the long head biceps tendon.

Acromioclavicular Joint: No significant AC joint arthropathy. No
fluid or contrast within the subacromial-subdeltoid bursa.

Glenohumeral Joint: Severe glenohumeral joint osteoarthritis with
extensive full-thickness chondral loss with marked subchondral
cystic changes in the posterosuperior aspect of the humeral head.
There also mild subchondral cystic changes within the
posteroinferior aspect of the glenoid with mild bone loss and
osseous remodeling. Glenohumeral joint is well distended with
injected contrast. There is extensive synovitis throughout the
glenohumeral joint.

Labrum: Circumferentially degenerated and torn.

Bones: Patchy marrow edema within the humeral head centered at the
posterosuperior aspect where there is marked subchondral cystic
changes. No acute fracture. No dislocation.
IMPRESSION: 1. Severe glenohumeral joint osteoarthritis with extensive
full-thickness chondral loss and marked subchondral cystic changes
in the posterosuperior aspect of the humeral head. Extensive
synovitis throughout the glenohumeral joint.
2. Supraspinatus tendinosis with partial-thickness tear of the
anterior and mid insertional fibers. Additional low-grade articular
sided tearing of the far anterior supraspinatus tendon fibers.
3. High-grade partial thickness tearing of the superior aspect of
the distal subscapularis tendon.
4. Tendinosis and low-grade interstitial tearing of the
intra-articular portion of the long head biceps tendon.

## 2021-12-03 ENCOUNTER — Other Ambulatory Visit: Payer: Self-pay

## 2021-12-03 ENCOUNTER — Ambulatory Visit (INDEPENDENT_AMBULATORY_CARE_PROVIDER_SITE_OTHER): Payer: Medicare Other | Admitting: Rehabilitative and Restorative Service Providers"

## 2021-12-03 ENCOUNTER — Encounter: Payer: Self-pay | Admitting: Rehabilitative and Restorative Service Providers"

## 2021-12-03 DIAGNOSIS — M6281 Muscle weakness (generalized): Secondary | ICD-10-CM | POA: Diagnosis not present

## 2021-12-03 DIAGNOSIS — M5412 Radiculopathy, cervical region: Secondary | ICD-10-CM

## 2021-12-03 DIAGNOSIS — R293 Abnormal posture: Secondary | ICD-10-CM

## 2021-12-03 NOTE — Therapy (Signed)
OUTPATIENT PHYSICAL THERAPY CERVICAL EVALUATION   Patient Name: Rick Turner MRN: [id number] DOB:December 27, 1950, 71 y.o., male Today's Date: 12/03/2021   PT End of Session - 12/03/21 1727     Visit Number 1    Number of Visits 16    Date for PT Re-Evaluation 01/28/22    Authorization Type Medicare    Progress Note Due on Visit 10    PT Start Time 1015    PT Stop Time 1100    PT Time Calculation (min) 45 min    Activity Tolerance Patient tolerated treatment well;No increased pain    Behavior During Therapy WFL for tasks assessed/performed             Past Medical History:  Diagnosis Date   Acne rosacea    Arthritis    BPH (benign prostatic hypertrophy)    Clavicle fracture    left   Detached retina    left    Detached retina, left 2011   Fx ankle    left   GERD (gastroesophageal reflux disease)    Hyperlipidemia    Hypertension    Hypothyroidism    OSA (obstructive sleep apnea)    mild   Seasonal allergies    Sleep apnea    Weakness of both lower extremities    Past Surgical History:  Procedure Laterality Date   CATARACT EXTRACTION  08/2011   left, right 2016   detached retiina  2011   left, 2014   EYE SURGERY     lasix   HERNIA REPAIR Right    ing   TONSILLECTOMY     TOTAL SHOULDER REPLACEMENT Left 2021   Patient Active Problem List   Diagnosis Date Noted   Hyperlipidemia 04/10/2020   Rib fracture 02/09/2019   Macula-off rhegmatogenous retinal detachment 04/19/2016   Dry eye syndrome 08/20/2013   Nuclear sclerotic cataract 08/20/2013   Pseudoaphakia 08/20/2013   Detached retina 08/20/2013    PCP: [name], MD  REFERRING PROVIDER: Magnus Sinning, MD  REFERRING DIAG: M54.12 (ICD-10-CM) - Cervical radiculopathy M54.2 (ICD-10-CM) - Cervicalgia M79.18 (ICD-10-CM) - Myofascial pain syndrome   THERAPY DIAG:  Abnormal posture  Muscle weakness (generalized)  Radiculopathy, cervical region  ONSET DATE: Chronic (~ 6  months)  SUBJECTIVE:                                                                                                                                                                                                         SUBJECTIVE STATEMENT: Rick Turner mentions neck and R UE pain that has been present for  about 6 months.  He has had 2 injections which have given him good symptomatic relief.  He plays gold 5X/week and wants to make sure he can continue his current activities without flaring himself back-up.  Although improved post-injections, symptoms are still present and are worst with rotation and flexion.  PERTINENT HISTORY:  Arthritis, L clavicle fracture, L ankle fracture, hyperlipidemia, HTN, hypothyroid, previous TSA  PAIN:  Are you having pain? Yes NPRS scale: 5/10 Pain location: R arm as distal as the elbow (was hand) Pain orientation: Right  PAIN TYPE: tingling Pain description: intermittent  Aggravating factors: Flexion and rotation Relieving factors: Injections  PRECAUTIONS: Cervical  WEIGHT BEARING RESTRICTIONS No  FALLS:  Has patient fallen in last 6 months? No Number of falls: 0  LIVING ENVIRONMENT: Lives with: lives with their family    OCCUPATION: Retired  PLOF: [place] Return to pain-free symptom-free golf 5 days a week  OBJECTIVE:   DIAGNOSTIC FINDINGS:  Rick Turner has postural impairments, weak scapular and cervical muscles, poor body mechanics awareness and an active L cervical radiculopathy in need of skilled care.  PATIENT SURVEYS:  FOTO 53 (Goal 63 in 11 visits)   COGNITION: Overall cognitive status: Within functional limits for tasks assessed    POSTURE:  Forward head, internally rotated and protracted shoulders, decreased lumbar lordosis  CERVICAL AROM  A/PROM AROM (deg) 12/03/2021  Flexion   Extension 70  Right lateral flexion 30  Left lateral flexion 20  Right rotation 45  Left rotation 35   (Blank rows = not  tested)    Strength:   Cervical strength in pounds 39.2 pounds extension 23.0 (L)/16.5 (R) pounds lateral bending Right 12/03/2021 Left 12/03/2021      TODAY'S TREATMENT:  Access Code: [email or web address] URL: [email or web address] Date: 12/03/2021 Prepared by: Vista Mink  Exercises Standing Scapular Retraction - 5 x daily - 7 x weekly - 1 sets - 5 reps - 5 second hold Standing Isometric Cervical Extension with Manual Resistance - 3-5 x daily - 7 x weekly - 1 sets - 5 reps - 5 hold Standing Isometric Cervical Sidebending with Manual Resistance - 2-3 x daily - 7 x weekly - 1 sets - 10 reps - 5 hold  Cervical traction static 8 minutes 0-25#   PATIENT EDUCATION:  Education details: Reviewed exam findings, reviewed imaging and related imaging to spine anatomy, discussed postural correction, education regarding traction and starter HEP. Person educated: Patient Education method: Explanation, Demonstration, Tactile cues, Verbal cues, and Handouts Education comprehension: verbalized understanding, returned demonstration, verbal cues required, tactile cues required, and needs further education   HOME EXERCISE PROGRAM: Access Code: [email or web address]  ASSESSMENT:  CLINICAL IMPRESSION: Rick Turner is a 71 y.o. Male who was seen today for physical therapy evaluation and treatment for R sided cervical radiculopathy. Objective impairments include decreased activity tolerance, decreased endurance, decreased knowledge of condition, decreased mobility, decreased ROM, decreased strength, decreased safety awareness, increased edema, increased fascial restrictions, impaired perceived functional ability, increased muscle spasms, impaired UE functional use, improper body mechanics, postural dysfunction, and pain. These impairments are limiting patient from community activity. Personal factors including Fitness are also affecting patient's functional outcome. Rick Turner will benefit from skilled PT to address above  impairments and improve overall function.  REHAB POTENTIAL: Good  CLINICAL DECISION MAKING: Stable/uncomplicated  EVALUATION COMPLEXITY: Low   GOALS: Goals reviewed with patient? Yes  SHORT TERM GOALS:  STG Name Target Date Goal status  1 Improve cervical AROM for lateral bending to 30/30 and rotation  to 50/50 degrees. Baseline: 20-30 and 35-45 respectively 12/31/2021 INITIAL   LONG TERM GOALS:   LTG Name Target Date Goal status  1 Improve FOTO to 63 in 11 visits. Baseline: 53 01/14/2022 INITIAL  2 Rick Turner will report neck, upper trapezius and scapular pain consistently 0-2/10 on the Numeric Pain Rating Scale with no R UE radicular symptoms. Baseline: 5/10 with radiculopathy 01/28/2022 INITIAL  3 Improve cervical extension strength to 50 and lateral bending to 30 pounds. Baseline: 39 and 16-23 pounds 01/28/2022 INITIAL  4 Rick Turner will have improved postural awareness and be able to implement this into daily activities. Baseline: Stared education at evaluation 01/28/2022 INITIAL  5 Rick Turner will be independent with his long-term HEP at DC. Baseline: Started today 01/28/2022 INITIAL   PLAN: PT FREQUENCY: 1-2x/week  PT DURATION: 8 weeks  PLANNED INTERVENTIONS: Therapeutic exercises, Therapeutic activity, Neuro Muscular re-education, Patient/Family education, Joint mobilization, Dry Needling, Traction, and Manual therapy  PLAN FOR NEXT SESSION: Assess traction comfort, consider 26-27#.  Progress scapular and postural strengthening.  Review HEP.   Farley Ly, PT, MPT 12/03/2021, 5:46 PM

## 2021-12-07 ENCOUNTER — Encounter: Payer: Self-pay | Admitting: Physical Therapy

## 2021-12-07 ENCOUNTER — Other Ambulatory Visit: Payer: Self-pay

## 2021-12-07 ENCOUNTER — Ambulatory Visit (INDEPENDENT_AMBULATORY_CARE_PROVIDER_SITE_OTHER): Payer: Medicare Other | Admitting: Physical Therapy

## 2021-12-07 DIAGNOSIS — M6281 Muscle weakness (generalized): Secondary | ICD-10-CM

## 2021-12-07 DIAGNOSIS — M25551 Pain in right hip: Secondary | ICD-10-CM | POA: Diagnosis not present

## 2021-12-07 DIAGNOSIS — I10 Essential (primary) hypertension: Secondary | ICD-10-CM | POA: Diagnosis not present

## 2021-12-07 DIAGNOSIS — R293 Abnormal posture: Secondary | ICD-10-CM | POA: Diagnosis not present

## 2021-12-07 DIAGNOSIS — R300 Dysuria: Secondary | ICD-10-CM | POA: Diagnosis not present

## 2021-12-07 DIAGNOSIS — M5412 Radiculopathy, cervical region: Secondary | ICD-10-CM

## 2021-12-07 DIAGNOSIS — R350 Frequency of micturition: Secondary | ICD-10-CM | POA: Diagnosis not present

## 2021-12-07 NOTE — Therapy (Deleted)
[facility] [address] [place], [place], [address] Phone: [phone]   Fax:  [phone]  Physical Therapy Treatment  Patient Details  Name: Rick Turner MRN: [id number] Date of Birth: 1951/03/09 No data recorded  Encounter Date: 12/07/2021    Past Medical History:  Diagnosis Date   Acne rosacea    Arthritis    BPH (benign prostatic hypertrophy)    Clavicle fracture    left   Detached retina    left    Detached retina, left 2011   Fx ankle    left   GERD (gastroesophageal reflux disease)    Hyperlipidemia    Hypertension    Hypothyroidism    OSA (obstructive sleep apnea)    mild   Seasonal allergies    Sleep apnea    Weakness of both lower extremities     Past Surgical History:  Procedure Laterality Date   CATARACT EXTRACTION  08/2011   left, right 2016   detached retiina  2011   left, 2014   EYE SURGERY     lasix   HERNIA REPAIR Right    ing   TONSILLECTOMY     TOTAL SHOULDER REPLACEMENT Left 2021    There were no vitals filed for this visit.                                            Patient will benefit from skilled therapeutic intervention in order to improve the following deficits and impairments:     Visit Diagnosis: No diagnosis found.     Problem List Patient Active Problem List   Diagnosis Date Noted   Hyperlipidemia 04/10/2020   Rib fracture 02/09/2019   Macula-off rhegmatogenous retinal detachment 04/19/2016   Dry eye syndrome 08/20/2013   Nuclear sclerotic cataract 08/20/2013   Pseudoaphakia 08/20/2013   Detached retina 08/20/2013    Oretha Caprice, PT 12/07/2021, 10:10 AM  [facility] [address] [place], [place], [address] Phone: [phone]   Fax:  [phone]  Name: Emily Forse MRN: [id number] Date of Birth: 1951/01/20

## 2021-12-07 NOTE — Therapy (Signed)
OUTPATIENT PHYSICAL THERAPY TREATMENT NOTE   Patient Name: Rick Turner MRN: [id number] DOB:1951-10-10, 71 y.o., male Today's Date: 12/07/2021  PCP: [name], MD REFERRING PROVIDER: Magnus Sinning, MD   PT End of Session - 12/07/21 1141     Visit Number 2    Number of Visits 16    Date for PT Re-Evaluation 01/28/22    Authorization Type Medicare    Progress Note Due on Visit 10    PT Start Time 1103    PT Stop Time 1150    PT Time Calculation (min) 47 min    Activity Tolerance Patient tolerated treatment well;No increased pain    Behavior During Therapy WFL for tasks assessed/performed             Past Medical History:  Diagnosis Date   Acne rosacea    Arthritis    BPH (benign prostatic hypertrophy)    Clavicle fracture    left   Detached retina    left    Detached retina, left 2011   Fx ankle    left   GERD (gastroesophageal reflux disease)    Hyperlipidemia    Hypertension    Hypothyroidism    OSA (obstructive sleep apnea)    mild   Seasonal allergies    Sleep apnea    Weakness of both lower extremities    Past Surgical History:  Procedure Laterality Date   CATARACT EXTRACTION  08/2011   left, right 2016   detached retiina  2011   left, 2014   EYE SURGERY     lasix   HERNIA REPAIR Right    ing   TONSILLECTOMY     TOTAL SHOULDER REPLACEMENT Left 2021   Patient Active Problem List   Diagnosis Date Noted   Hyperlipidemia 04/10/2020   Rib fracture 02/09/2019   Macula-off rhegmatogenous retinal detachment 04/19/2016   Dry eye syndrome 08/20/2013   Nuclear sclerotic cataract 08/20/2013   Pseudoaphakia 08/20/2013   Detached retina 08/20/2013    REFERRING DIAG: M54.12 (ICD-10-CM) - Cervical radiculopathy M54.2 (ICD-10-CM) - Cervicalgia M79.18 (ICD-10-CM) - Myofascial pain syndrome    THERAPY DIAG:  Abnormal posture   Muscle weakness (generalized)   Radiculopathy, cervical region   ONSET DATE: Chronic (~ 6 months)     Precautions: cervical  PERTINENT HISTORY:  Arthritis, L clavicle fracture, L ankle fracture, hyperlipidemia, HTN, hypothyroid, previous TSA  PRECAUTIONS: none  SUBJECTIVE: Pt arriving today reporting 4/10 pain and reporting soreness with cervical paraspinals and believes it could have been the overpressure with cervical resistance exercises. Pt stating there are times his pain goes away completely, but then can hit all of sudden.   PAIN:  Are you having pain? Yes NPRS scale: 4/10 Pain location: neck Pain orientation: Right  PAIN TYPE: aching Pain description: intermittent  Aggravating factors: reaching across body Relieving factors: changing positions   Strength:     Cervical strength in pounds 39.2 pounds extension 23.0 (L)/16.5 (R) pounds lateral bending Right 12/03/2021 Left 12/03/2021   A/PROM AROM (deg) 12/03/2021  Flexion    Extension 70  Right lateral flexion 30  Left lateral flexion 20  Right rotation 45  Left rotation 35   (Blank rows = not tested)  TODAY'S TREATMENT:   12/07/2021    Therapeutic Exercise:  Aerobic: Supine: Prone:  Seated: UBE: L1.5 3 minutes each direction  Standing: rows: Level 2 Therabnd 2x15       Shoulder extension: Level 2  theraband 2x15  Shoulder ER, Level 2 therband 2x15 right side       Rolling red physiball up the wall x 10 hodling  Neuromuscular Re-education: Manual Therapy: Therapeutic Activity: Self Care: Trigger Point Dry Needling:  Modalities: Cervical Traction: 25# 20 minutes cervical protocol                    Percussion to bilateral Upper traps and right anterior/lateral shoulder.      12/03/2021  Access Code: [email or web address] URL: [email or web address] Date: 12/03/2021 Prepared by: Vista Mink   Exercises Standing Scapular Retraction - 5 x daily - 7 x weekly - 1 sets - 5 reps - 5 second hold Standing Isometric Cervical Extension with Manual Resistance - 3-5 x daily -  7 x weekly - 1 sets - 5 reps - 5 hold Standing Isometric Cervical Sidebending with Manual Resistance - 2-3 x daily - 7 x weekly - 1 sets - 10 reps - 5 hold   Cervical traction static 8 minutes 0-25#     PATIENT EDUCATION:  Education details: Discussed DN and handout issued for next treatment visit. Person educated: Patient Education method: Explanation, Demonstration, Tactile cues, Verbal cues, and Handouts Education comprehension: verbalized understanding, returned demonstration, verbal cues required, tactile cues required, and needs further education     HOME EXERCISE PROGRAM: Access Code: [email or web address]   ASSESSMENT:   CLINICAL IMPRESSION: Pt tolerating exercises and stretching well. Pt with good response to percussion/manual therapy and cervical traction. Pt was instructed to hold off on the resistive cervical isometrics due to reports of increased pain. Continue skilled PT to maximize function.     Objective impairments include decreased activity tolerance, decreased endurance, decreased knowledge of condition, decreased mobility, decreased ROM, decreased strength, decreased safety awareness, increased edema, increased fascial restrictions, impaired perceived functional ability, increased muscle spasms, impaired UE functional use, improper body mechanics, postural dysfunction, and pain. These impairments are limiting patient from community activity. Personal factors including Fitness are also affecting patient's functional outcome. Rick Turner will benefit from skilled PT to address above impairments and improve overall function.   REHAB POTENTIAL: Good   CLINICAL DECISION MAKING: Stable/uncomplicated   EVALUATION COMPLEXITY: Low     GOALS: Goals reviewed with patient? Yes   SHORT TERM GOALS:   STG Name Target Date Goal status  1 Improve cervical AROM for lateral bending to 30/30 and rotation to 50/50 degrees. Baseline: 20-30 and 35-45 respectively 12/31/2021 INITIAL    LONG TERM GOALS:     LTG Name Target Date Goal status  1 Improve FOTO to 63 in 11 visits. Baseline: 53 01/14/2022 INITIAL  2 Rick Turner will report neck, upper trapezius and scapular pain consistently 0-2/10 on the Numeric Pain Rating Scale with no R UE radicular symptoms. Baseline: 5/10 with radiculopathy 01/28/2022 INITIAL  3 Improve cervical extension strength to 50 and lateral bending to 30 pounds. Baseline: 39 and 16-23 pounds 01/28/2022 INITIAL  4 Rick Turner will have improved postural awareness and be able to implement this into daily activities. Baseline: Stared education at evaluation 01/28/2022 INITIAL  5 Rick Turner will be independent with his long-term HEP at [place]. Baseline: Started today 01/28/2022 INITIAL    PLAN: PT FREQUENCY: 1-2x/week   PT DURATION: 8 weeks   PLANNED INTERVENTIONS: Therapeutic exercises, Therapeutic activity, Neuro Muscular re-education, Patient/Family education, Joint mobilization, Dry Needling, Traction, and Manual therapy   PLAN FOR NEXT SESSION: Assess traction comfort, consider 26-27#.  Progress scapular and postural strengthening.  Review HEP.   Oretha Caprice, PT, MPT 12/07/2021,  11:45 AM

## 2021-12-10 ENCOUNTER — Ambulatory Visit (INDEPENDENT_AMBULATORY_CARE_PROVIDER_SITE_OTHER): Payer: Medicare Other | Admitting: Rehabilitative and Restorative Service Providers"

## 2021-12-10 ENCOUNTER — Other Ambulatory Visit: Payer: Self-pay

## 2021-12-10 ENCOUNTER — Encounter: Payer: Self-pay | Admitting: Rehabilitative and Restorative Service Providers"

## 2021-12-10 ENCOUNTER — Encounter: Payer: Medicare Other | Admitting: Rehabilitative and Restorative Service Providers"

## 2021-12-10 DIAGNOSIS — R293 Abnormal posture: Secondary | ICD-10-CM | POA: Diagnosis not present

## 2021-12-10 DIAGNOSIS — M6281 Muscle weakness (generalized): Secondary | ICD-10-CM

## 2021-12-10 DIAGNOSIS — M5412 Radiculopathy, cervical region: Secondary | ICD-10-CM | POA: Diagnosis not present

## 2021-12-10 NOTE — Therapy (Addendum)
OUTPATIENT PHYSICAL THERAPY TREATMENT NOTE   Patient Name: Rick Turner MRN: [id number] DOB:08-31-1951, 71 y.o., male Today's Date: 12/10/2021  PCP: [name], MD REFERRING PROVIDER: Magnus Sinning, MD   PT End of Session - 12/10/21 1215     Visit Number 3    Number of Visits 16    Date for PT Re-Evaluation 01/28/22    Authorization Type Medicare    Progress Note Due on Visit 10    PT Start Time 1139    PT Stop Time 1225    PT Time Calculation (min) 46 min    Activity Tolerance Patient tolerated treatment well    Behavior During Therapy [facility] for tasks assessed/performed              Past Medical History:  Diagnosis Date   Acne rosacea    Arthritis    BPH (benign prostatic hypertrophy)    Clavicle fracture    left   Detached retina    left    Detached retina, left 2011   Fx ankle    left   GERD (gastroesophageal reflux disease)    Hyperlipidemia    Hypertension    Hypothyroidism    OSA (obstructive sleep apnea)    mild   Seasonal allergies    Sleep apnea    Weakness of both lower extremities    Past Surgical History:  Procedure Laterality Date   CATARACT EXTRACTION  08/2011   left, right 2016   detached retiina  2011   left, 2014   EYE SURGERY     lasix   HERNIA REPAIR Right    ing   TONSILLECTOMY     TOTAL SHOULDER REPLACEMENT Left 2021   Patient Active Problem List   Diagnosis Date Noted   Hyperlipidemia 04/10/2020   Rib fracture 02/09/2019   Macula-off rhegmatogenous retinal detachment 04/19/2016   Dry eye syndrome 08/20/2013   Nuclear sclerotic cataract 08/20/2013   Pseudoaphakia 08/20/2013   Detached retina 08/20/2013    REFERRING DIAG: M54.12 (ICD-10-CM) - Cervical radiculopathy M54.2 (ICD-10-CM) - Cervicalgia M79.18 (ICD-10-CM) - Myofascial pain syndrome    THERAPY DIAG:  Abnormal posture   Muscle weakness (generalized)   Radiculopathy, cervical region   ONSET DATE: Chronic (~ 6 months)    Precautions:  cervical  PERTINENT HISTORY:  Arthritis, L clavicle fracture, L ankle fracture, hyperlipidemia, HTN, hypothyroid, previous TSA  PRECAUTIONS: none  SUBJECTIVE: Patient complaints in Rt arm noted c playing golf.  Pt. Indicated moderately level of pains today.    PAIN:  Are you having pain? Yes NPRS scale: 4/10 Pain location: Rt arm upper Pain orientation: Right  PAIN TYPE: tight, s Pain description: constant last day  Aggravating factors: playing golf, reaching out Relieving factors: direct pressure on upper arm helped some  AROM:  12/10/2021: Cervical ext:52 c neck tightness Rt  Cervical flexion: 48 c neck tightness Rt   No arm symptom change noted  Cervical Lt rotation: 60 c Rt neck tightness  Cervical Rt rotation: 70 c Rt neck/Rt arm symptoms Strength:    12/03/2021 Cervical strength in pounds 39.2 pounds extension 23.0 (L)/16.5 (R) pounds lateral bending   A/PROM AROM (deg) 12/03/2021  Flexion    Extension 70  Right lateral flexion 30  Left lateral flexion 20  Right rotation 45  Left rotation 35   (Blank rows = not tested)  TODAY'S TREATMENT:  12/10/2021:    Therapeutic Exercise:  Aerobic: UBE: Lvl 2 3 minutes each direction Supine: Prone:  Seated:  Seated cervical retraction x 10   Seated Rt upper trap stretch 15 sec x 5   Standing: Rows c scapular retraction blue band x20       Shoulder extension: blue band 20x                              Shoulder ER c towel at side green band 2 x 15 bilateral        Manual Therapy: compression to Rt upper trap/infraspinatus, skilled palpation c DN Trigger Point Dry Needling: Twitch response noted  Modalities: Cervical Traction: 25# 20 minutes cervical protocol                    Percussion to bilateral Upper traps and right anterior/lateral shoulder.    12/07/2021    Therapeutic Exercise:  Aerobic: Supine: Prone:  Seated: UBE: Lvl 1.5 3 minutes each direction  Standing: rows: Level 2 Therabnd 2x15       Shoulder  extension: Level 2  theraband 2x15                              Shoulder ER, Level 2 therband 2x15 right side       Rolling red physiball up the wall x 10 hodling  Neuromuscular Re-education: Manual Therapy: Therapeutic Activity: Self Care: Trigger Point Dry Needling:  Modalities: Cervical Traction: 15 mins intermittent 25 lbs 60 sec, 20 sec 15 lbs                          12/03/2021  Access Code: [email or web address] URL: [email or web address] Date: 12/03/2021 Prepared by: Vista Mink   Exercises Standing Scapular Retraction - 5 x daily - 7 x weekly - 1 sets - 5 reps - 5 second hold Standing Isometric Cervical Extension with Manual Resistance - 3-5 x daily - 7 x weekly - 1 sets - 5 reps - 5 hold Standing Isometric Cervical Sidebending with Manual Resistance - 2-3 x daily - 7 x weekly - 1 sets - 10 reps - 5 hold   Cervical traction static 8 minutes 0-25#     PATIENT EDUCATION:  12/10/2021:  Education c printout on HEP progression.   12/07/2021: Education details: Discussed DN and handout issued for next treatment visit. Person educated: Patient Education method: Explanation, Demonstration, Tactile cues, Verbal cues, and Handouts Education comprehension: verbalized understanding, returned demonstration, verbal cues required, tactile cues required, and needs further education     HOME EXERCISE PROGRAM: Access Code: [email or web address]   ASSESSMENT:   CLINICAL IMPRESSION: Improvement in cervical mobility symptoms noted c decreased Rt arm symptoms noted c cervical rotation post manual/DN intervention today.  Adjusted cervical traction settings at mild reduction in pull to ensure proper positional alignment during use.  Possible involvement of cervical radicular involvement as well as localized Rt shoulder symptoms possible myofascial referred in nature.  Continued skilled PT services indicated.    REHAB POTENTIAL: Good   CLINICAL DECISION MAKING: Stable/uncomplicated   EVALUATION  COMPLEXITY: Low     GOALS:    SHORT TERM GOALS:  Assessed 12/10/2021 STG Name Target Date Goal status  1 Improve cervical AROM for lateral bending to 30/30 and rotation to 50/50 degrees.  12/31/2021 On going    LONG TERM GOALS:    LTG Name Target Date Goal status  1 Improve FOTO to 63 in 11 visits. Baseline: 53  01/14/2022 INITIAL  2 [name] will report neck, upper trapezius and scapular pain consistently 0-2/10 on the Numeric Pain Rating Scale with no R UE radicular symptoms. Baseline: 5/10 with radiculopathy 01/28/2022 INITIAL  3 Improve cervical extension strength to 50 and lateral bending to 30 pounds. Baseline: 39 and 16-23 pounds 01/28/2022 INITIAL  4 [name] will have improved postural awareness and be able to implement this into daily activities. Baseline: Stared education at evaluation 01/28/2022 INITIAL  5 [name] will be independent with his long-term HEP at [place]. Baseline: Started today 01/28/2022 INITIAL    PLAN: PT FREQUENCY: 1-2x/week   PT DURATION: 8 weeks   PLANNED INTERVENTIONS: Therapeutic exercises, Therapeutic activity, Neuro Muscular re-education, Patient/Family education, Joint mobilization, Dry Needling, Traction, and Manual therapy   PLAN FOR NEXT SESSION: Reassess upper trap DN results.  Possible DN to Rt infraspinatus for check of referred pain to Rt arm involvement.  Traction use as desired

## 2021-12-15 ENCOUNTER — Other Ambulatory Visit: Payer: Self-pay

## 2021-12-15 ENCOUNTER — Encounter: Payer: Self-pay | Admitting: Physical Therapy

## 2021-12-15 ENCOUNTER — Ambulatory Visit (INDEPENDENT_AMBULATORY_CARE_PROVIDER_SITE_OTHER): Payer: Medicare Other | Admitting: Physical Therapy

## 2021-12-15 DIAGNOSIS — R293 Abnormal posture: Secondary | ICD-10-CM

## 2021-12-15 DIAGNOSIS — M6281 Muscle weakness (generalized): Secondary | ICD-10-CM

## 2021-12-15 DIAGNOSIS — M5412 Radiculopathy, cervical region: Secondary | ICD-10-CM

## 2021-12-15 NOTE — Therapy (Signed)
OUTPATIENT PHYSICAL THERAPY TREATMENT NOTE   Patient Name: Rick Turner MRN: [id number] DOB:1951/08/24, 71 y.o., male Today's Date: 12/15/2021  PCP: [name], MD REFERRING PROVIDER: Magnus Sinning, MD   PT End of Session - 12/15/21 0845     Visit Number 4    Number of Visits 16    Date for PT Re-Evaluation 01/28/22    Authorization Type Medicare    Progress Note Due on Visit 10    PT Start Time 0845    PT Stop Time 0930    PT Time Calculation (min) 45 min    Activity Tolerance Patient tolerated treatment well    Behavior During Therapy [facility] for tasks assessed/performed               Past Medical History:  Diagnosis Date   Acne rosacea    Arthritis    BPH (benign prostatic hypertrophy)    Clavicle fracture    left   Detached retina    left    Detached retina, left 2011   Fx ankle    left   GERD (gastroesophageal reflux disease)    Hyperlipidemia    Hypertension    Hypothyroidism    OSA (obstructive sleep apnea)    mild   Seasonal allergies    Sleep apnea    Weakness of both lower extremities    Past Surgical History:  Procedure Laterality Date   CATARACT EXTRACTION  08/2011   left, right 2016   detached retiina  2011   left, 2014   EYE SURGERY     lasix   HERNIA REPAIR Right    ing   TONSILLECTOMY     TOTAL SHOULDER REPLACEMENT Left 2021   Patient Active Problem List   Diagnosis Date Noted   Hyperlipidemia 04/10/2020   Rib fracture 02/09/2019   Macula-off rhegmatogenous retinal detachment 04/19/2016   Dry eye syndrome 08/20/2013   Nuclear sclerotic cataract 08/20/2013   Pseudoaphakia 08/20/2013   Detached retina 08/20/2013    REFERRING DIAG: M54.12 (ICD-10-CM) - Cervical radiculopathy M54.2 (ICD-10-CM) - Cervicalgia M79.18 (ICD-10-CM) - Myofascial pain syndrome    THERAPY DIAG:  Abnormal posture   Muscle weakness (generalized)   Radiculopathy, cervical region   ONSET DATE: Chronic (~ 6 months)    Precautions:  cervical  PERTINENT HISTORY:  Arthritis, L clavicle fracture, L ankle fracture, hyperlipidemia, HTN, hypothyroid, previous TSA  PRECAUTIONS: none  SUBJECTIVE:Pt reporting 3/10 pain in his right shoulder today. Pt stating the DN seemed to help at his last visit with decrease in overall pain. Pt still reporting radiation down Rt UE into upper arm to elbow.   PAIN:  Are you having pain? Yes NPRS scale: 3/10 Pain location: Rt arm upper Pain orientation: Right  PAIN TYPE: tight, s Pain description: constant last day  Aggravating factors: playing golf, reaching out Relieving factors: direct pressure on upper arm helped some  AROM:  12/10/2021: Cervical ext:52 c neck tightness Rt  Cervical flexion: 48 c neck tightness Rt   No arm symptom change noted  Cervical Lt rotation: 60 c Rt neck tightness  Cervical Rt rotation: 70 c Rt neck/Rt arm symptoms Strength:    12/03/2021 Cervical strength in pounds 39.2 pounds extension 23.0 (L)/16.5 (R) pounds lateral bending   A/PROM AROM (deg) 12/03/2021  Flexion    Extension 70  Right lateral flexion 30  Left lateral flexion 20  Right rotation 45  Left rotation 35   (Blank rows = not tested)  TODAY'S TREATMENT:  12/15/2021:  Therapeutic Exercise:  Aerobic: UBE: Lvl 3 , 3 minutes each direction Supine: Prone:  Seated:      Seated Rt upper trap stretch 15 sec x 5   Standing: Rows c scapular retraction holding 3 seconds blue band 2x15       Shoulder extension: blue band 2x15                             Shoulder ER c towel at side green band 2 x 15 bilateral        Manual Therapy: compression to Rt upper trap/infraspinatus, middle deltoid, skilled palpation with DN Trigger Point Dry Needling: Twitch response noted  Modalities: Cervical Traction: 25# 20 minutes cervical protocol                     12/10/2021:    Therapeutic Exercise:  Aerobic: UBE: Lvl 2 3 minutes each direction Supine: Prone:  Seated:     Seated cervical  retraction x 10   Seated Rt upper trap stretch 15 sec x 5   Standing: Rows c scapular retraction blue band x20       Shoulder extension: blue band 20x                              Shoulder ER c towel at side green band 2 x 15 bilateral        Manual Therapy: compression to Rt upper trap/infraspinatus, skilled palpation c DN Trigger Point Dry Needling: Twitch response noted  Modalities: Cervical Traction: 25# 20 minutes cervical protocol                    Percussion to bilateral Upper traps and right anterior/lateral shoulder.    12/07/2021    Therapeutic Exercise:  Aerobic: Supine: Prone:  Seated: UBE: Lvl 1.5 3 minutes each direction  Standing: rows: Level 2 Therabnd 2x15       Shoulder extension: Level 2  theraband 2x15                              Shoulder ER, Level 2 therband 2x15 right side       Rolling red physiball up the wall x 10 hodling  Neuromuscular Re-education: Manual Therapy: Therapeutic Activity: Self Care: Trigger Point Dry Needling:  Modalities: Cervical Traction: 15 mins intermittent 25 lbs 60 sec, 20 sec 15 lbs                          12/03/2021  Access Code: [email or web address] URL: [email or web address] Date: 12/03/2021 Prepared by: Vista Mink   Exercises Standing Scapular Retraction - 5 x daily - 7 x weekly - 1 sets - 5 reps - 5 second hold Standing Isometric Cervical Extension with Manual Resistance - 3-5 x daily - 7 x weekly - 1 sets - 5 reps - 5 hold Standing Isometric Cervical Sidebending with Manual Resistance - 2-3 x daily - 7 x weekly - 1 sets - 10 reps - 5 hold   Cervical traction static 8 minutes 0-25#     PATIENT EDUCATION:  2/382023: reviewed follow up after DN   12/07/2021: Education details: Discussed DN expectations and home treatments/HEP Person educated: Patient Education method: Explanation, Demonstration, Tactile cues, Verbal cues, and Handouts Education comprehension: verbalized understanding, returned  demonstration, verbal cues  required, tactile cues required, and needs further education     HOME EXERCISE PROGRAM: Access Code: [email or web address]   ASSESSMENT:   CLINICAL IMPRESSION: Pt reporting 3/10 pain in right shoulder today. Pt with good response to DN last visit and traction protocol. Pt reporting improvements in radiation down right UE. DN today to right infraspinatus and deltoid with twitch response noted. Pt stating he has only had one strong episode of pain down his right UE that limited function. Pt tolerating exercises well. Assess response to DN at next visit. Continue skilled PT to maximize pt's function.    REHAB POTENTIAL: Good   CLINICAL DECISION MAKING: Stable/uncomplicated   EVALUATION COMPLEXITY: Low     GOALS:    SHORT TERM GOALS:  Assessed 12/15/2021 STG Name Target Date Goal status  1 Improve cervical AROM for lateral bending to 30/30 and rotation to 50/50 degrees.  12/31/2021 On going    LONG TERM GOALS:    LTG Name Target Date Goal status  1 Improve FOTO to 63 in 11 visits. Baseline: 53 01/14/2022 ongoing  2 [name] will report neck, upper trapezius and scapular pain consistently 0-2/10 on the Numeric Pain Rating Scale with no R UE radicular symptoms. Baseline: 5/10 with radiculopathy 01/28/2022 ongoing  3 Improve cervical extension strength to 50 and lateral bending to 30 pounds. Baseline: 39 and 16-23 pounds 01/28/2022 ongoing  4 [name] will have improved postural awareness and be able to implement this into daily activities. Baseline: Stared education at evaluation 01/28/2022 ongoing  5 [name] will be independent with his long-term HEP at [place]. Baseline: Started today 01/28/2022 ongoing    PLAN: PT FREQUENCY: 1-2x/week   PT DURATION: 8 weeks   PLANNED INTERVENTIONS: Therapeutic exercises, Therapeutic activity, Neuro Muscular re-education, Patient/Family education, Joint mobilization, Dry Needling, Traction, and Manual therapy   PLAN FOR NEXT SESSION: Reassess  infraspinatus/deltoid DN results. Traction use as desired, shoulder ROM, cervical stretching       Kearney Hard, PT, MPT 12/15/21 9:27 AM

## 2021-12-17 ENCOUNTER — Other Ambulatory Visit: Payer: Self-pay

## 2021-12-17 ENCOUNTER — Encounter: Payer: Self-pay | Admitting: Rehabilitative and Restorative Service Providers"

## 2021-12-17 ENCOUNTER — Ambulatory Visit (INDEPENDENT_AMBULATORY_CARE_PROVIDER_SITE_OTHER): Payer: Medicare Other | Admitting: Rehabilitative and Restorative Service Providers"

## 2021-12-17 DIAGNOSIS — M6281 Muscle weakness (generalized): Secondary | ICD-10-CM

## 2021-12-17 DIAGNOSIS — M5412 Radiculopathy, cervical region: Secondary | ICD-10-CM | POA: Diagnosis not present

## 2021-12-17 DIAGNOSIS — R293 Abnormal posture: Secondary | ICD-10-CM

## 2021-12-17 NOTE — Therapy (Addendum)
OUTPATIENT PHYSICAL THERAPY TREATMENT NOTE   Patient Name: Rick Turner MRN: [id number] DOB:[birth year], 71 y.o., male Today's Date: 12/17/2021  PCP: [name], MD REFERRING PROVIDER: Magnus Sinning, MD   PT End of Session - 12/17/21 0850     Visit Number 5    Number of Visits 16    Date for PT Re-Evaluation 01/28/22    Authorization Type Medicare    Progress Note Due on Visit 10    PT Start Time 0800    PT Stop Time 0845    PT Time Calculation (min) 45 min    Activity Tolerance Patient tolerated treatment well    Behavior During Therapy [facility] for tasks assessed/performed                Past Medical History:  Diagnosis Date   Acne rosacea    Arthritis    BPH (benign prostatic hypertrophy)    Clavicle fracture    left   Detached retina    left    Detached retina, left 2011   Fx ankle    left   GERD (gastroesophageal reflux disease)    Hyperlipidemia    Hypertension    Hypothyroidism    OSA (obstructive sleep apnea)    mild   Seasonal allergies    Sleep apnea    Weakness of both lower extremities    Past Surgical History:  Procedure Laterality Date   CATARACT EXTRACTION  08/2011   left, right 2016   detached retiina  2011   left, 2014   EYE SURGERY     lasix   HERNIA REPAIR Right    ing   TONSILLECTOMY     TOTAL SHOULDER REPLACEMENT Left 2021   Patient Active Problem List   Diagnosis Date Noted   Hyperlipidemia 04/10/2020   Rib fracture 02/09/2019   Macula-off rhegmatogenous retinal detachment 04/19/2016   Dry eye syndrome 08/20/2013   Nuclear sclerotic cataract 08/20/2013   Pseudoaphakia 08/20/2013   Detached retina 08/20/2013    REFERRING DIAG: M54.12 (ICD-10-CM) - Cervical radiculopathy M54.2 (ICD-10-CM) - Cervicalgia M79.18 (ICD-10-CM) - Myofascial pain syndrome    THERAPY DIAG:  Abnormal posture   Muscle weakness (generalized)   Radiculopathy, cervical region   ONSET DATE: Chronic (~ 6 months)    Precautions:  cervical  PERTINENT HISTORY:  Arthritis, L clavicle fracture, L ankle fracture, hyperlipidemia, HTN, hypothyroid, previous TSA  PRECAUTIONS: Cervical (postural)  SUBJECTIVE: Rick Turner reports his R arm pain remains a "steady 3/10" to the elbow.  Neck and R upper trapezius pain ranges between 2-4/10 depending on activity.  Higher pain in the morning and with prolonged postures.  Less with activity.    PAIN:  Are you having pain? Yes NPRS scale: 3/10 Pain location: R arm Pain orientation: Right  PAIN TYPE: dull Pain description: constant Aggravating factors: prolonged postures and in the AM Relieving factors: activity, exercises  AROM:  12/17/2021: Cervical extension: 70  Cervical lateral bending (L/R): 25/30  Cervical rotation (L/R): 70/55 Strength:    12/17/2021 (was 12/03/2021) Cervical strength in pounds 33.3 pounds (Was 39.2 pounds) extension 15.9 L & 23.7 R (Was 23.0 (L)/16.5 (R) pounds) lateral bending   A/PROM AROM (deg) 12/03/2021  Flexion    Extension 70  Right lateral flexion 30  Left lateral flexion 20  Right rotation 45  Left rotation 35   (Blank rows = not tested)  TODAY'S TREATMENT:  12/17/2021: Therapeutic Exercise:  Aerobic: Supine: Prone:   Seated:    Standing:  Rows c  scapular retraction holding 3 seconds Blue band 20X Shoulder extension Red band Palms up, chin level 10X slow eccentrics                        Shoulder ER c towel under elbow Green band 10 bilateral, slow eccentrics, perfect posture   Shoulder blade pinches 10X 5 seconds   Cervical Extension Isometrics 10X 5 seconds   Cervical lateral bending isometrics 10X (5 each side) 5 seconds        Manual Therapy: compression to Rt infraspinatus, Rt upper trap.  Prone cPA T3-t6 g4, regional rotation PA manip x 2 mid thoracic Trigger Point Dry Needling: Rt infraspinatus and Rt upper trap c twitch response noted c concordant symptom area.  Good tolerance with no adverse reaction Modalities: Cervical  Traction: 25# 20 minutes cervical protocol  12/15/2021:    Therapeutic Exercise:  Aerobic: UBE: Lvl 3 , 3 minutes each direction Supine: Prone:  Seated:      Seated Rt upper trap stretch 15 sec x 5   Standing: Rows c scapular retraction holding 3 seconds blue band 2x15       Shoulder extension: blue band 2x15                             Shoulder ER c towel at side green band 2 x 15 bilateral        Manual Therapy: compression to Rt upper trap/infraspinatus, middle deltoid, skilled palpation with DN Trigger Point Dry Needling: Twitch response noted  Modalities: Cervical Traction: 25# 20 minutes cervical protocol                     12/10/2021:    Therapeutic Exercise:  Aerobic: UBE: Lvl 2 3 minutes each direction Supine: Prone:  Seated:     Seated cervical retraction x 10   Seated Rt upper trap stretch 15 sec x 5   Standing: Rows c scapular retraction blue band x20       Shoulder extension: blue band 20x                              Shoulder ER c towel at side green band 2 x 15 bilateral        Manual Therapy: compression to Rt upper trap/infraspinatus, skilled palpation c DN Trigger Point Dry Needling: Twitch response noted  Modalities: Cervical Traction: 25# 20 minutes cervical protocol                    Percussion to bilateral Upper traps and right anterior/lateral shoulder.    12/07/2021    Therapeutic Exercise:  Aerobic: Supine: Prone:  Seated: UBE: Lvl 1.5 3 minutes each direction  Standing: rows: Level 2 Therabnd 2x15       Shoulder extension: Level 2  theraband 2x15                              Shoulder ER, Level 2 therband 2x15 right side       Rolling red physiball up the wall x 10 hodling  Neuromuscular Re-education: Manual Therapy: Therapeutic Activity: Self Care: Trigger Point Dry Needling:  Modalities: Cervical Traction: 15 mins intermittent 25 lbs 60 sec, 20 sec 15 lbs  12/03/2021  Access Code: [email or web address] URL:  [email or web address] Date: 12/03/2021 Prepared by: Vista Mink   Exercises Standing Scapular Retraction - 5 x daily - 7 x weekly - 1 sets - 5 reps - 5 second hold Standing Isometric Cervical Extension with Manual Resistance - 3-5 x daily - 7 x weekly - 1 sets - 5 reps - 5 hold Standing Isometric Cervical Sidebending with Manual Resistance - 2-3 x daily - 7 x weekly - 1 sets - 10 reps - 5 hold   Cervical traction static 8 minutes 0-27#     PATIENT EDUCATION:    12/17/2021: Discussed posture, importance of frequent changes of position, updated and corrected HEP.  2/382023: Reviewed follow up after DN   12/07/2021: Education details: Discussed DN expectations and home treatments/HEP Person educated: Patient Education method: Explanation, Demonstration, Tactile cues, Verbal cues, and Handouts Education comprehension: verbalized understanding, returned demonstration, verbal cues required, tactile cues required, and needs further education     HOME EXERCISE PROGRAM: Access Code: [email or web address]   ASSESSMENT:   CLINICAL IMPRESSION: Rick Turner is making objective AROM progress early in his PT.  Strength still needs work and will be a focus of future progressions.  Cervical traction and dry needling will also be used to help with neck pain and radicular symptoms.   REHAB POTENTIAL: Good   CLINICAL DECISION MAKING: Stable/uncomplicated   EVALUATION COMPLEXITY: Low     GOALS:    SHORT TERM GOALS:  Assessed 12/15/2021 STG Name Target Date Goal status  1 Improve cervical AROM for lateral bending to 30/30 and rotation to 50/50 degrees.  12/31/2021 On going    LONG TERM GOALS:    LTG Name Target Date Goal status  1 Improve FOTO to 63 in 11 visits. Baseline: 53 01/14/2022 ongoing  2 [name] will report neck, upper trapezius and scapular pain consistently 0-2/10 on the Numeric Pain Rating Scale with no R UE radicular symptoms. Baseline: 5/10 with radiculopathy 01/28/2022 ongoing  3  Improve cervical extension strength to 50 and lateral bending to 30 pounds. Baseline: 39 and 16-23 pounds 01/28/2022 ongoing  4 [name] will have improved postural awareness and be able to implement this into daily activities. Baseline: Stared education at evaluation 01/28/2022 ongoing  5 [name] will be independent with his long-term HEP at [place]. Baseline: Started today 01/28/2022 ongoing    PLAN: PT FREQUENCY: 1-2x/week   PT DURATION: 8 weeks   PLANNED INTERVENTIONS: Therapeutic exercises, Therapeutic activity, Neuro Muscular re-education, Patient/Family education, Joint mobilization, Dry Needling, Traction, and Manual therapy   PLAN FOR NEXT SESSION: Reassess infraspinatus/deltoid DN results. Traction use as desired, shoulder ROM, cervical stretching     Vista Mink, PT, MPT 12/17/21 9:42 AM  Dry needling and manual portion of today's visit was performed by Scot Jun, PT, DPT, OCS, ATC 12/17/21  9:42 AM

## 2021-12-21 DIAGNOSIS — M6281 Muscle weakness (generalized): Secondary | ICD-10-CM | POA: Diagnosis not present

## 2021-12-21 DIAGNOSIS — M25551 Pain in right hip: Secondary | ICD-10-CM | POA: Diagnosis not present

## 2021-12-21 DIAGNOSIS — R350 Frequency of micturition: Secondary | ICD-10-CM | POA: Diagnosis not present

## 2021-12-21 DIAGNOSIS — R300 Dysuria: Secondary | ICD-10-CM | POA: Diagnosis not present

## 2021-12-22 ENCOUNTER — Ambulatory Visit (INDEPENDENT_AMBULATORY_CARE_PROVIDER_SITE_OTHER): Payer: Medicare Other | Admitting: Physical Therapy

## 2021-12-22 ENCOUNTER — Other Ambulatory Visit: Payer: Self-pay

## 2021-12-22 ENCOUNTER — Encounter: Payer: Self-pay | Admitting: Physical Therapy

## 2021-12-22 DIAGNOSIS — R293 Abnormal posture: Secondary | ICD-10-CM | POA: Diagnosis not present

## 2021-12-22 DIAGNOSIS — M5412 Radiculopathy, cervical region: Secondary | ICD-10-CM | POA: Diagnosis not present

## 2021-12-22 DIAGNOSIS — M6281 Muscle weakness (generalized): Secondary | ICD-10-CM

## 2021-12-22 NOTE — Therapy (Signed)
OUTPATIENT PHYSICAL THERAPY TREATMENT NOTE   Patient Name: Rick Turner MRN: [id number] DOB:09/18/1951, 71 y.o., male Today's Date: 12/22/2021  PCP: [name], MD REFERRING PROVIDER: Magnus Sinning, MD   PT End of Session - 12/22/21 0813     Visit Number 6    Number of Visits 16    Date for PT Re-Evaluation 01/28/22    Progress Note Due on Visit 10    PT Start Time 0804    PT Stop Time 1025    PT Time Calculation (min) 40 min    Activity Tolerance Patient tolerated treatment well    Behavior During Therapy [facility] for tasks assessed/performed                 Past Medical History:  Diagnosis Date   Acne rosacea    Arthritis    BPH (benign prostatic hypertrophy)    Clavicle fracture    left   Detached retina    left    Detached retina, left 2011   Fx ankle    left   GERD (gastroesophageal reflux disease)    Hyperlipidemia    Hypertension    Hypothyroidism    OSA (obstructive sleep apnea)    mild   Seasonal allergies    Sleep apnea    Weakness of both lower extremities    Past Surgical History:  Procedure Laterality Date   CATARACT EXTRACTION  08/2011   left, right 2016   detached retiina  2011   left, 2014   EYE SURGERY     lasix   HERNIA REPAIR Right    ing   TONSILLECTOMY     TOTAL SHOULDER REPLACEMENT Left 2021   Patient Active Problem List   Diagnosis Date Noted   Hyperlipidemia 04/10/2020   Rib fracture 02/09/2019   Macula-off rhegmatogenous retinal detachment 04/19/2016   Dry eye syndrome 08/20/2013   Nuclear sclerotic cataract 08/20/2013   Pseudoaphakia 08/20/2013   Detached retina 08/20/2013    REFERRING DIAG: M54.12 (ICD-10-CM) - Cervical radiculopathy M54.2 (ICD-10-CM) - Cervicalgia M79.18 (ICD-10-CM) - Myofascial pain syndrome    THERAPY DIAG:  Abnormal posture   Muscle weakness (generalized)   Radiculopathy, cervical region   ONSET DATE: Chronic (~ 6 months)    Precautions: cervical  PERTINENT HISTORY:   Arthritis, L clavicle fracture, L ankle fracture, hyperlipidemia, HTN, hypothyroid, previous TSA  PRECAUTIONS: Cervical (postural)  SUBJECTIVE:  Pt arriving today reporting 2/10 pain in his right shoulder and neck. Pt stating he wake up each night about 4 am and his right arm is throbbing with 7/10 pain. Pt stating once he wakes up his pain decreases and it's better throughout the day.  PAIN:  Are you having pain? Yes NPRS scale: 2/10 Pain location: R arm/neck Pain orientation: Right  PAIN TYPE: aching Pain description: constant Aggravating factors: prolonged postures and in the AM Relieving factors: activity, exercises  AROM:    12/22/2021:            Cervical rotation: right: 70 degrees, left 72 degrees           Cervical side bending: right: 30 degrees, left 30 degrees    12/17/2021: Cervical extension: 70  Cervical lateral bending (L/R): 25/30  Cervical rotation (L/R): 70/55 Strength:    12/17/2021 (was 12/03/2021) Cervical strength in pounds 33.3 pounds (Was 39.2 pounds) extension 15.9 L & 23.7 R (Was 23.0 (L)/16.5 (R) pounds) lateral bending   A/PROM AROM (deg) 12/03/2021  Flexion    Extension 70  Right  lateral flexion 30  Left lateral flexion 20  Right rotation 45  Left rotation 35   (Blank rows = not tested)  TODAY'S TREATMENT:   12/22/2021 Therapeutic Exercise:  Aerobic: UBE L3 3 minutes each direction Supine: Prone:   Seated:    Standing:  Rows c scapular retraction holding 3 seconds Level 4 band 20X Shoulder flexion with 3# bar x 15 holding 3 seconds at end range.                         Rt Shoulder ER with Level 4 band 2 x 15   Shoulder blade pinches 10X 5 seconds   Rt shoulder abduction using 3# bar x 15 pain free range         Manual Therapy:  Trigger Point Dry Needling:  Modalities: Cervical Traction: 25# 20 minutes cervical protocol    12/17/2021: Therapeutic Exercise:  Aerobic: Supine: Prone:   Seated:    Standing:  Rows c scapular  retraction holding 3 seconds Blue band 20X Shoulder extension Red band Palms up, chin level 10X slow eccentrics                        Shoulder ER c towel under elbow Green band 10 bilateral, slow eccentrics, perfect posture   Shoulder blade pinches 10X 5 seconds   Cervical Extension Isometrics 10X 5 seconds   Cervical lateral bending isometrics 10X (5 each side) 5 seconds        Manual Therapy: compression to Rt infraspinatus, Rt upper trap.  Prone cPA T3-t6 g4, regional rotation PA manip x 2 mid thoracic Trigger Point Dry Needling: Rt infraspinatus and Rt upper trap c twitch response noted c concordant symptom area.  Good tolerance with no adverse reaction Modalities: Cervical Traction: 25# 20 minutes cervical protocol  12/15/2021:    Therapeutic Exercise:  Aerobic: UBE: Lvl 3 , 3 minutes each direction Supine: Prone:  Seated:      Seated Rt upper trap stretch 15 sec x 5   Standing: Rows c scapular retraction holding 3 seconds blue band 2x15       Shoulder extension: blue band 2x15                             Shoulder ER c towel at side green band 2 x 15 bilateral        Manual Therapy: compression to Rt upper trap/infraspinatus, middle deltoid, skilled palpation with DN Trigger Point Dry Needling: Twitch response noted  Modalities: Cervical Traction: 25# 20 minutes cervical protocol                     12/10/2021:    Therapeutic Exercise:  Aerobic: UBE: Lvl 2 3 minutes each direction Supine: Prone:  Seated:     Seated cervical retraction x 10   Seated Rt upper trap stretch 15 sec x 5   Standing: Rows c scapular retraction blue band x20       Shoulder extension: blue band 20x                              Shoulder ER c towel at side green band 2 x 15 bilateral        Manual Therapy: compression to Rt upper trap/infraspinatus, skilled palpation c DN Trigger Point Dry Needling: Twitch response noted  Modalities: Cervical Traction: 25# 20 minutes cervical protocol                     Percussion to bilateral Upper traps and right anterior/lateral shoulder.    12/07/2021    Therapeutic Exercise:  Aerobic: Supine: Prone:  Seated: UBE: Lvl 1.5 3 minutes each direction  Standing: rows: Level 2 Therabnd 2x15       Shoulder extension: Level 2  theraband 2x15                              Shoulder ER, Level 2 therband 2x15 right side       Rolling red physiball up the wall x 10 hodling  Neuromuscular Re-education: Manual Therapy: Therapeutic Activity: Self Care: Trigger Point Dry Needling:  Modalities: Cervical Traction: 15 mins intermittent 25 lbs 60 sec, 20 sec 15 lbs                               PATIENT EDUCATION:   12/22/2021: Discussed changing pillows at night for neck support as well as a body pillow between arms and legs for spinal support   12/17/2021: Discussed posture, importance of frequent changes of position, updated and corrected HEP.  2/382023: Reviewed follow up after DN       HOME EXERCISE PROGRAM: Access Code: [email or web address]   ASSESSMENT:   CLINICAL IMPRESSION: Pt has improved his right cervical rotation by 15 degrees. Pt reporting improvements in pain throughout the day but still reporting pain of 7/10 at night in his right arm. We discussed pillows for spinal support and positioning while sleeping. Continue with skilled PT to maximize pt's function.    REHAB POTENTIAL: Good   CLINICAL DECISION MAKING: Stable/uncomplicated   EVALUATION COMPLEXITY: Low     GOALS:    SHORT TERM GOALS:  Assessed 12/15/2021 STG Name Target Date Goal status  1 Improve cervical AROM for lateral bending to 30/30 and rotation to 50/50 degrees.  12/31/2021 Met 12/22/2021    LONG TERM GOALS:    LTG Name Target Date Goal status  1 Improve FOTO to 63 in 11 visits. Baseline: 53 01/14/2022 Ongoing 12/22/2021  2 [name] will report neck, upper trapezius and scapular pain consistently 0-2/10 on the Numeric Pain Rating Scale with no R UE radicular  symptoms. Baseline: 5/10 with radiculopathy 01/28/2022 Ongoing 12/22/2021  3 Improve cervical extension strength to 50 and lateral bending to 30 pounds. Baseline: 39 and 16-23 pounds 01/28/2022 Ongoing 12/22/2021  4 [name] will have improved postural awareness and be able to implement this into daily activities. Baseline: Stared education at evaluation 01/28/2022 Ongoing 12/22/2021  5 [name] will be independent with his long-term HEP at DC. Baseline: Started today 01/28/2022 Ongoing 12/22/2021    PLAN: PT FREQUENCY: 1-2x/week   PT DURATION: 8 weeks   PLANNED INTERVENTIONS: Therapeutic exercises, Therapeutic activity, Neuro Muscular re-education, Patient/Family education, Joint mobilization, Dry Needling, Traction, and Manual therapy   PLAN FOR NEXT SESSION: Reassess infraspinatus/deltoid DN results. Traction use as desired, shoulder ROM, cervical stretching      Kearney Hard, PT, MPT 12/22/21 8:42 AM

## 2021-12-23 DIAGNOSIS — R7301 Impaired fasting glucose: Secondary | ICD-10-CM | POA: Diagnosis not present

## 2021-12-23 DIAGNOSIS — N400 Enlarged prostate without lower urinary tract symptoms: Secondary | ICD-10-CM | POA: Diagnosis not present

## 2021-12-23 DIAGNOSIS — E7800 Pure hypercholesterolemia, unspecified: Secondary | ICD-10-CM | POA: Diagnosis not present

## 2021-12-23 DIAGNOSIS — I10 Essential (primary) hypertension: Secondary | ICD-10-CM | POA: Diagnosis not present

## 2021-12-23 DIAGNOSIS — E291 Testicular hypofunction: Secondary | ICD-10-CM | POA: Diagnosis not present

## 2021-12-23 DIAGNOSIS — G4733 Obstructive sleep apnea (adult) (pediatric): Secondary | ICD-10-CM | POA: Diagnosis not present

## 2021-12-23 DIAGNOSIS — E039 Hypothyroidism, unspecified: Secondary | ICD-10-CM | POA: Diagnosis not present

## 2021-12-23 DIAGNOSIS — Z1389 Encounter for screening for other disorder: Secondary | ICD-10-CM | POA: Diagnosis not present

## 2021-12-23 DIAGNOSIS — R29898 Other symptoms and signs involving the musculoskeletal system: Secondary | ICD-10-CM | POA: Diagnosis not present

## 2021-12-23 DIAGNOSIS — R1032 Left lower quadrant pain: Secondary | ICD-10-CM | POA: Diagnosis not present

## 2021-12-23 DIAGNOSIS — Z0000 Encounter for general adult medical examination without abnormal findings: Secondary | ICD-10-CM | POA: Diagnosis not present

## 2021-12-24 ENCOUNTER — Other Ambulatory Visit: Payer: Self-pay

## 2021-12-24 ENCOUNTER — Ambulatory Visit (INDEPENDENT_AMBULATORY_CARE_PROVIDER_SITE_OTHER): Payer: Medicare Other | Admitting: Rehabilitative and Restorative Service Providers"

## 2021-12-24 ENCOUNTER — Encounter: Payer: Self-pay | Admitting: Rehabilitative and Restorative Service Providers"

## 2021-12-24 DIAGNOSIS — R293 Abnormal posture: Secondary | ICD-10-CM

## 2021-12-24 DIAGNOSIS — M5412 Radiculopathy, cervical region: Secondary | ICD-10-CM

## 2021-12-24 DIAGNOSIS — M6281 Muscle weakness (generalized): Secondary | ICD-10-CM | POA: Diagnosis not present

## 2021-12-24 NOTE — Therapy (Addendum)
OUTPATIENT PHYSICAL THERAPY TREATMENT NOTE   Patient Name: Rick Turner MRN: [id number] DOB:[birth year], 71 y.o., male Today's Date: 12/24/2021  PCP: [name], MD REFERRING PROVIDER: Magnus Sinning, MD   PT End of Session - 12/24/21 0910     Visit Number 7    Number of Visits 16    Date for PT Re-Evaluation 01/28/22    Progress Note Due on Visit 10    PT Start Time 0804    PT Stop Time 1324    PT Time Calculation (min) 39 min    Activity Tolerance Patient tolerated treatment well    Behavior During Therapy [facility] for tasks assessed/performed                  Past Medical History:  Diagnosis Date   Acne rosacea    Arthritis    BPH (benign prostatic hypertrophy)    Clavicle fracture    left   Detached retina    left    Detached retina, left 2011   Fx ankle    left   GERD (gastroesophageal reflux disease)    Hyperlipidemia    Hypertension    Hypothyroidism    OSA (obstructive sleep apnea)    mild   Seasonal allergies    Sleep apnea    Weakness of both lower extremities    Past Surgical History:  Procedure Laterality Date   CATARACT EXTRACTION  08/2011   left, right 2016   detached retiina  2011   left, 2014   EYE SURGERY     lasix   HERNIA REPAIR Right    ing   TONSILLECTOMY     TOTAL SHOULDER REPLACEMENT Left 2021   Patient Active Problem List   Diagnosis Date Noted   Hyperlipidemia 04/10/2020   Rib fracture 02/09/2019   Macula-off rhegmatogenous retinal detachment 04/19/2016   Dry eye syndrome 08/20/2013   Nuclear sclerotic cataract 08/20/2013   Pseudoaphakia 08/20/2013   Detached retina 08/20/2013    REFERRING DIAG: M54.12 (ICD-10-CM) - Cervical radiculopathy M54.2 (ICD-10-CM) - Cervicalgia M79.18 (ICD-10-CM) - Myofascial pain syndrome    THERAPY DIAG:  Abnormal posture   Muscle weakness (generalized)   Radiculopathy, cervical region   ONSET DATE: Chronic (~ 6 months)    Precautions: cervical  PERTINENT HISTORY:   Arthritis, L clavicle fracture, L ankle fracture, hyperlipidemia, HTN, hypothyroid, previous TSA  PRECAUTIONS: Cervical (postural)  SUBJECTIVE:  Rick Turner notes his arm pain is much better than when he started PT 3 weeks ago.  He has a "dull ache" in his arm but at it had been "throbbing."  He has been sleeping on his back with an extra pillow which has helped decrease his arm pain first thing in the morning.  We discussed things to watch out for and be aware of as far as negative consequences related to flexion (disc) vs positives (opening up space for the nerve root).   PAIN:  Are you having pain? Yes NPRS scale: 1/10 Pain location: R arm/neck Pain orientation: Right  PAIN TYPE: aching Pain description: constant Aggravating factors: prolonged postures and in the AM Relieving factors: activity, exercises  AROM:    12/22/2021:            Cervical rotation: right: 70 degrees, left 72 degrees           Cervical side bending: right: 30 degrees, left 30 degrees    12/17/2021: Cervical extension: 70  Cervical lateral bending (L/R): 25/30  Cervical rotation (L/R): 70/55 Strength:  12/17/2021 (was 12/03/2021) Cervical strength in pounds 33.3 pounds (Was 39.2 pounds) extension 15.9 L & 23.7 R (Was 23.0 (L)/16.5 (R) pounds) lateral bending   A/PROM AROM (deg) 12/03/2021  Flexion    Extension 70  Right lateral flexion 30  Left lateral flexion 20  Right rotation 45  Left rotation 35   (Blank rows = not tested)  TODAY'S TREATMENT:  12/24/2021 Therapeutic Exercise:  Aerobic: Supine: Prone:   Seated:    Standing:  Rows c scapular retraction holding 3 seconds Blue band 20X Shoulder extension Red band Palms up, chin level 10X slow eccentrics                        Shoulder ER c towel under elbow Green band 10 bilateral, slow eccentrics, perfect posture   Shoulder blade pinches 10X 5 seconds   Cervical Extension Isometrics 10X 5 seconds   Cervical lateral bending isometrics 10X (5 each  side) 5 seconds  Manual: compression to Rt infraspinatus, skilled palpation to Rt infraspinatus during dry needling        Trigger Point Dry Needling: Rt infraspinatus c twitch response noted c concordant symptom area.  Good tolerance with no adverse reaction  Modalities: Cervical Traction: 26# 8 minutes cervical protocol   12/22/2021 Therapeutic Exercise:  Aerobic: UBE L3 3 minutes each direction Supine: Prone:   Seated:    Standing:  Rows c scapular retraction holding 3 seconds Level 4 band 20X Shoulder flexion with 3# bar x 15 holding 3 seconds at end range.                         Rt Shoulder ER with Level 4 band 2 x 15   Shoulder blade pinches 10X 5 seconds   Rt shoulder abduction using 3# bar x 15 pain free range         Manual Therapy:  Trigger Point Dry Needling:  Modalities: Cervical Traction: 25# 20 minutes cervical protocol    12/17/2021: Therapeutic Exercise:  Aerobic: Supine: Prone:   Seated:    Standing:  Rows c scapular retraction holding 3 seconds Blue band 20X Shoulder extension Red band Palms up, chin level 10X slow eccentrics                        Shoulder ER c towel under elbow Green band 10 bilateral, slow eccentrics, perfect posture   Shoulder blade pinches 10X 5 seconds   Cervical Extension Isometrics 10X 5 seconds   Cervical lateral bending isometrics 10X (5 each side) 5 seconds        Manual Therapy: compression to Rt infraspinatus, Rt upper trap.  Prone cPA T3-t6 g4, regional rotation PA manip x 2 mid thoracic Trigger Point Dry Needling: Rt infraspinatus and Rt upper trap c twitch response noted c concordant symptom area.  Good tolerance with no adverse reaction Modalities: Cervical Traction: 25# 20 minutes cervical protocol  PATIENT EDUCATION:    12/24/2021: Long discussion regarding sleeping posture and cervical anatomy while on traction (see subjective).  12/22/2021: Discussed changing pillows at night for neck support as well as a body  pillow between arms and legs for spinal support  12/17/2021: Discussed posture, importance of frequent changes of position, updated and corrected HEP.        HOME EXERCISE PROGRAM: Access Code: [email or web address]   ASSESSMENT:   CLINICAL IMPRESSION: Rick Turner notes significant progress with his R arm and neck  pain since starting PT.  He can now golf with less pain and R arm pain to the elbow is improved (although still present) in the morning.  I recommend Rick Turner continue his current program to reduce or remove R UE symptoms and allow him to return to full function with minimal to no pain/dysfunction.    REHAB POTENTIAL: Good   CLINICAL DECISION MAKING: Stable/uncomplicated   EVALUATION COMPLEXITY: Low     GOALS:    SHORT TERM GOALS:  Assessed 12/15/2021 STG Name Target Date Goal status  1 Improve cervical AROM for lateral bending to 30/30 and rotation to 50/50 degrees.  12/31/2021 Met 12/22/2021    LONG TERM GOALS:    LTG Name Target Date Goal status  1 Improve FOTO to 63 in 11 visits. Baseline: 53 01/14/2022 Ongoing 12/22/2021  2 Rick Turner will report neck, upper trapezius and scapular pain consistently 0-2/10 on the Numeric Pain Rating Scale with no R UE radicular symptoms. Baseline: 5/10 with radiculopathy 01/28/2022 Ongoing 12/22/2021  3 Improve cervical extension strength to 50 and lateral bending to 30 pounds. Baseline: 39 and 16-23 pounds 01/28/2022 Ongoing 12/22/2021  4 Rick Turner will have improved postural awareness and be able to implement this into daily activities. Baseline: Stared education at evaluation 01/28/2022 Ongoing 12/22/2021  5 Rick Turner will be independent with his long-term HEP at DC. Baseline: Started today 01/28/2022 Ongoing 12/22/2021    PLAN: PT FREQUENCY: 1-2x/week   PT DURATION: 8 weeks   PLANNED INTERVENTIONS: Therapeutic exercises, Therapeutic activity, Neuro Muscular re-education, Patient/Family education, Joint mobilization, Dry Needling, Traction, and Manual therapy   PLAN  FOR NEXT SESSION: Reassess infraspinatus/deltoid DN results. Traction up to 27#, progress prone postural strengthening.    Nathanial Rancher, PT, MPT 12/24/21 9:12 AM  Manual/dry needling performed by Scot Jun, PT, DPT, OCS, ATC 12/24/21  9:52 AM

## 2021-12-26 NOTE — Progress Notes (Signed)
Cardiology Clinic Note   Patient Name: Rick Turner Date of Encounter: 12/28/2021  Primary Care Provider:  [name], MD Primary Cardiologist:  [name], MD  Patient Profile    Rick Turner 71 year old male presents to the clinic today for follow-up evaluation of his coronary artery disease and dyspnea on exertion.  Past Medical History    Past Medical History:  Diagnosis Date   Acne rosacea    Arthritis    BPH (benign prostatic hypertrophy)    Clavicle fracture    left   Detached retina    left    Detached retina, left 2011   Fx ankle    left   GERD (gastroesophageal reflux disease)    Hyperlipidemia    Hypertension    Hypothyroidism    OSA (obstructive sleep apnea)    mild   Seasonal allergies    Sleep apnea    Weakness of both lower extremities    Past Surgical History:  Procedure Laterality Date   CATARACT EXTRACTION  08/2011   left, right 2016   detached retiina  2011   left, 2014   EYE SURGERY     lasix   HERNIA REPAIR Right    ing   TONSILLECTOMY     TOTAL SHOULDER REPLACEMENT Left 2021    Allergies  Allergies  Allergen Reactions   Evolocumab     Other reaction(s): leg wekness   Hydrocod Poli-Chlorphe Poli Er     Other reaction(s): Unknown   Hydrocodone-Acetaminophen     Other reaction(s): itchy   Statins Other (See Comments)    Pt reports muscle weakness when taking statins    History of Present Illness    Rick Turner has a PMH of coronary artery disease, HTN, HLD, hypothyroidism, and OSA.  He was referred by his PCP for evaluation of his dyspnea on exertion.  He was initially seen 03/31/2020.  He was noted to have an elevated calcium score of 645 9/15 in [place].  He underwent nuclear stress test 10/15 which was normal.  He reported having mild dyspnea with exertion.  He denied chest pain.  He plays golf and noted that when he would walk up hills he would feel short of breath.  He would also note shortness  of breath with walking up stairs.  He also noted pain in his thighs with exertion.  His blood pressure had been elevated in the 150/90 range at home.  He followed up with [name] and was placed in a hypertension program.  They were working on adjusting his antihypertensive medication.  He was not able to tolerate statins, ezetimibe or Repatha.  He developed significant leg weakness with all the medications.  His LDL was 115 11/02/2019 on ezetimibe.  He was taking ibuprofen 800 mg daily for shoulder and knee pain.  He denied smoking.  He reported that his father had CVA in his 58s and also had CABG in his 80s.  His mother had atrial fibrillation.  He reported that his son was also recently diagnosed with atypical hypertrophic cardiomyopathy.  He underwent nuclear stress test 04/07/2020 and was noted to have normal perfusion.  He had normal ABIs on 6/21.  His echocardiogram 04/28/2020 showed normal biventricular function, G1 DD, mild dilation of his aortic root which measured 38 mm.  After his clinic visit he was referred to the lipid clinic where he was started on Praluent 04/10/2020.  He followed up with [name] 01/08/2021 during that time he continued to  do well.  He continued to have some leg pain with increased physical activity such as going up stairs.  He denied chest pain and dyspnea as well as lightheadedness, syncope, palpitations and lower extremity swelling.  His LDL was 55 on 12/18/2020.  His Praluent dose was decreased to every 3 weeks due to leg pain.  He reported compliance with his CPAP.  He presents to the clinic today for follow-up evaluation states he continues to work to find out what is causing his thigh pain and weakness.  He has been to see neurology who recommended strengthening exercises and did nerve conduction studies.  The strengthening exercises have made his leg stronger however he continues to notice leg pain with walking up stairs and inclines.  He continues to play golf 4-5  times per week but is now using a cart.  He plays with his brother who works at [employer] course.  He was advised by his PCP to come off of Praluent.  It was felt that Praluent may be contributing to his muscle issue.  His LDL cholesterol is 163 11/25/2021.  He continues to work with neurology and has a follow-up appointment in the near future to continue evaluation.  I recommended that he contact the office when he figures out because of his muscle weakness so that we may pursue other options for lowering his cholesterol.  He expressed understanding.  We will have him maintain his physical activity, maintain his high-fiber diet, continue his current medication regimen, and follow-up in 1 year.  Today he denies chest pain, shortness of breath, lower extremity edema, fatigue, palpitations, melena, hematuria, hemoptysis, diaphoresis, weakness, presyncope, syncope, orthopnea, and PND.  Home Medications    Prior to Admission medications   Medication Sig Start Date End Date Taking? Authorizing Provider  acetaminophen (TYLENOL) 500 MG tablet Take 2 tablets (1,000 mg total) by mouth every 6 (six) hours. 02/10/19   Georganna Skeans, MD  amLODipine (NORVASC) 10 MG tablet Take 10 mg by mouth daily.    [provider]  amoxicillin (AMOXIL) 500 MG capsule  12/01/20   [provider]  aspirin EC 81 MG tablet 81 mg daily.    [provider]  carvedilol (COREG) 6.25 MG tablet 1 tablet with food 01/05/21   [provider]  Cholecalciferol (VITAMIN D3) 50 MCG (2000 UT) capsule 1 capsule    [provider]  Cyanocobalamin (VITAMIN B12) 1000 MCG TBCR 1 tablet    [provider]  cyclobenzaprine (FLEXERIL) 10 MG tablet Take 10 mg by mouth 3 (three) times daily as needed for muscle spasms.    [provider]  diclofenac sodium (VOLTAREN) 1 % GEL APPLY TOPICALLY TO AFFECTED AREA ONCE DAILY AS NEEDED FOR PAIN 07/31/19   Meredith Pel, MD  fexofenadine (ALLEGRA)  180 MG tablet Take 180 mg by mouth daily.     [provider]  fluticasone (FLONASE) 50 MCG/ACT nasal spray Place 1 spray into both nostrils 2 (two) times daily.     [provider]  gabapentin (NEURONTIN) 300 MG capsule Take 1 capsule by mouth 2 (two) times daily. 04/16/21   [provider]  levothyroxine (SYNTHROID) 125 MCG tablet Take 125 mcg by mouth every morning. 04/17/20   [provider]  Multiple Vitamin (MULTI VITAMIN) TABS 1 tablet    [provider]  omeprazole (PRILOSEC) 20 MG capsule Take 20 mg by mouth daily.    [provider]  tadalafil (CIALIS) 5 MG tablet Take  5 mg by mouth daily as needed (AS DIRECTED).  07/30/13   [provider]  tamsulosin (FLOMAX) 0.4 MG CAPS capsule Take 0.4 mg by mouth daily. 02/05/19   [provider]  telmisartan (MICARDIS) 80 MG tablet Take 80 mg by mouth daily.    [provider]    Family History    Family History  Problem Relation Age of Onset   CAD Mother    CAD Father    Breast cancer Sister    Hypertension Brother    Lung cancer Brother    Hypertension Brother    Parkinson's disease Brother    He indicated that his mother is deceased. He indicated that his father is deceased. He indicated that both of his sisters are alive. He indicated that five of his seven brothers are alive.  Social History    Social History   Socioeconomic History   Marital status: Married    Spouse name: Not on file   Number of children: 3   Years of education: PhD   Highest education level: Professional school degree (e.g., MD, DDS, DVM, JD)  Occupational History   Not on file  Tobacco Use   Smoking status: Never   Smokeless tobacco: Never  Substance and Sexual Activity   Alcohol use: Yes    Alcohol/week: 6.0 - 10.0 standard drinks    Types: 6 - 10 Standard drinks or equivalent per week    Comment: 2-3 daily, 04/26/21 15-20 weekly   Drug use: Never   Sexual activity: Not  on file  Other Topics Concern   Not on file  Social History Narrative   Lives with wife, retired    Caffeine 2-3 c day   Social Determinants of Health   Financial Resource Strain: Not on file  Food Insecurity: Not on file  Transportation Needs: Not on file  Physical Activity: Not on file  Stress: Not on file  Social Connections: Not on file  Intimate Partner Violence: Not on file     Review of Systems    General:  No chills, fever, night sweats or weight changes.  Cardiovascular:  No chest pain, dyspnea on exertion, edema, orthopnea, palpitations, paroxysmal nocturnal dyspnea. Dermatological: No rash, lesions/masses Respiratory: No cough, dyspnea Urologic: No hematuria, dysuria Abdominal:   No nausea, vomiting, diarrhea, bright red blood per rectum, melena, or hematemesis Neurologic:  No visual changes, wkns, changes in mental status. All other systems reviewed and are otherwise negative except as noted above.  Physical Exam    VS:  BP 118/72    Pulse (!) 59    Ht 5\' 11"  (1.803 m)    Wt 240 lb (108.9 kg)    SpO2 98%    BMI 33.47 kg/m  , BMI Body mass index is 33.47 kg/m. GEN: Well nourished, well developed, in no acute distress. HEENT: normal. Neck: Supple, no JVD, carotid bruits, or masses. Cardiac: RRR, no murmurs, rubs, or gallops. No clubbing, cyanosis, edema.  Radials/DP/PT 2+ and equal bilaterally.  Respiratory:  Respirations regular and unlabored, clear to auscultation bilaterally. GI: Soft, nontender, nondistended, BS + x 4. MS: no deformity or atrophy. Skin: warm and dry, no rash. Neuro:  Strength and sensation are intact. Psych: Normal affect.  Accessory Clinical Findings    Recent Labs: 05/13/2021: BUN 22; Creatinine, Ser 1.31; Hemoglobin 12.1; Platelets 238; Potassium 4.0; Sodium 133; TSH 1.020 11/24/2021: ALT 15   Recent Lipid Panel    Component Value Date/Time   CHOL 243 (H) 11/24/2021  0846   TRIG 69 11/24/2021 0846   HDL 68 11/24/2021 0846    CHOLHDL 3.6 11/24/2021 0846   LDLCALC 163 (H) 11/24/2021 0846    ECG personally reviewed by me today-sinus bradycardia 59 bpm- No acute changes  Echocardiogram 04/28/2020  IMPRESSIONS     1. Normal LV systolic function; grade 1 diastolic dysfunction; mildly  dilated aortic root.   2. Left ventricular ejection fraction, by estimation, is 60 to 65%. The  left ventricle has normal function. The left ventricle has no regional  wall motion abnormalities. Left ventricular diastolic parameters are  consistent with Grade I diastolic  dysfunction (impaired relaxation).   3. Right ventricular systolic function is normal. The right ventricular  size is normal.   4. The mitral valve is normal in structure. No evidence of mitral valve  regurgitation. No evidence of mitral stenosis.   5. The aortic valve is tricuspid. Aortic valve regurgitation is not  visualized. No aortic stenosis is present.   6. Aortic dilatation noted. There is mild dilatation of the aortic root  measuring 38 mm.   7. The inferior vena cava is normal in size with greater than 50%  respiratory variability, suggesting right atrial pressure of 3 mmHg.  Assessment & Plan   1.  Coronary artery disease-denies recent episodes of arm neck back or chest discomfort.  Underwent nuclear stress test 04/07/2020 which showed normal LVEF.  Echocardiogram 04/28/2020 showed normal biventricular function, G1 DD, mild dilation of aortic root measuring 38 mm.  Cholesterol much better controlled on Praluent. Continue aspirin, carvedilol, Heart healthy low-sodium diet-salty 6 given Increase physical activity as tolerated  Hyperlipidemia-LDL 50 2022 while on Praluent.  Intolerant of Repatha, ezetimibe, and statins. Continue Praluent Heart healthy low-sodium high-fiber diet Increase physical activity as tolerated  Dyspnea on exertion-no further episodes of shortness of breath or dyspnea.  Previously felt to be possible anginal equivalent. No  further plans for ischemic evaluation at this time  Essential hypertension-BP today 118/72.  Well-controlled at home. Continue amlodipine, carvedilol, telmisartan Heart healthy low-sodium diet-salty 6 given Increase physical activity as tolerated   Family history of hypertrophic cardiomyopathy-no evidence of hypertrophic cardiomyopathy on most recent echocardiogram.  Patient noted his son was diagnosed with apical hypertrophic cardiomyopathy.  Previously discussed the importance of screening for first-degree relatives.  Reviewed.  Disposition: Follow-up with [name] or me in 9-12 months.  Jossie Ng. Canaan Holzer NP-C    12/28/2021, 10:32 AM [address] Office [phone] Fax [phone]  Notice: This dictation was prepared with Dragon dictation along with smaller phrase technology. Any transcriptional errors that result from this process are unintentional and may not be corrected upon review.  I spent 14 minutes examining this patient, reviewing medications, and using patient centered shared decision making involving her cardiac care.  Prior to her visit I spent greater than 20 minutes reviewing her past medical history,  medications, and prior cardiac tests.

## 2021-12-27 ENCOUNTER — Other Ambulatory Visit: Payer: Self-pay

## 2021-12-27 ENCOUNTER — Encounter (INDEPENDENT_AMBULATORY_CARE_PROVIDER_SITE_OTHER): Payer: Medicare Other | Admitting: Ophthalmology

## 2021-12-27 DIAGNOSIS — I10 Essential (primary) hypertension: Secondary | ICD-10-CM

## 2021-12-27 DIAGNOSIS — H35372 Puckering of macula, left eye: Secondary | ICD-10-CM

## 2021-12-27 DIAGNOSIS — H43811 Vitreous degeneration, right eye: Secondary | ICD-10-CM

## 2021-12-27 DIAGNOSIS — H35033 Hypertensive retinopathy, bilateral: Secondary | ICD-10-CM | POA: Diagnosis not present

## 2021-12-27 DIAGNOSIS — H338 Other retinal detachments: Secondary | ICD-10-CM

## 2021-12-28 ENCOUNTER — Ambulatory Visit (INDEPENDENT_AMBULATORY_CARE_PROVIDER_SITE_OTHER): Payer: Medicare Other | Admitting: General Practice

## 2021-12-28 ENCOUNTER — Encounter: Payer: Self-pay | Admitting: General Practice

## 2021-12-28 ENCOUNTER — Encounter: Payer: Self-pay | Admitting: Diagnostic Neuroimaging

## 2021-12-28 VITALS — BP 118/72 | HR 59 | Ht 71.0 in | Wt 240.0 lb

## 2021-12-28 DIAGNOSIS — E785 Hyperlipidemia, unspecified: Secondary | ICD-10-CM

## 2021-12-28 DIAGNOSIS — R0609 Other forms of dyspnea: Secondary | ICD-10-CM | POA: Diagnosis not present

## 2021-12-28 DIAGNOSIS — R7989 Other specified abnormal findings of blood chemistry: Secondary | ICD-10-CM | POA: Diagnosis not present

## 2021-12-28 DIAGNOSIS — E291 Testicular hypofunction: Secondary | ICD-10-CM | POA: Diagnosis not present

## 2021-12-28 DIAGNOSIS — I10 Essential (primary) hypertension: Secondary | ICD-10-CM | POA: Diagnosis not present

## 2021-12-28 DIAGNOSIS — R29898 Other symptoms and signs involving the musculoskeletal system: Secondary | ICD-10-CM | POA: Diagnosis not present

## 2021-12-28 DIAGNOSIS — I2510 Atherosclerotic heart disease of native coronary artery without angina pectoris: Secondary | ICD-10-CM

## 2021-12-28 DIAGNOSIS — G4733 Obstructive sleep apnea (adult) (pediatric): Secondary | ICD-10-CM | POA: Diagnosis not present

## 2021-12-28 DIAGNOSIS — R7301 Impaired fasting glucose: Secondary | ICD-10-CM | POA: Diagnosis not present

## 2021-12-28 DIAGNOSIS — Z8249 Family history of ischemic heart disease and other diseases of the circulatory system: Secondary | ICD-10-CM | POA: Diagnosis not present

## 2021-12-28 DIAGNOSIS — D539 Nutritional anemia, unspecified: Secondary | ICD-10-CM | POA: Diagnosis not present

## 2021-12-28 DIAGNOSIS — E039 Hypothyroidism, unspecified: Secondary | ICD-10-CM | POA: Diagnosis not present

## 2021-12-28 NOTE — Patient Instructions (Addendum)
Medication Instructions:  The current medical regimen is effective;  continue present plan and medications as directed. Please refer to the Current Medication list given to you today.   *If you need a refill on your cardiac medications before your next appointment, please call your pharmacy*  Lab Work:   Testing/Procedures:  NONE    NONE  Special Instructions PLEASE INCREASE FIBER IN YOUR DIET-SEE ATTACHED  PLEASE INCREASE PHYSICAL ACTIVITY AS TOLERATED  CALL WITH THE STATUS OF YOUR NEUROLOGY APPOINTMENT  Follow-Up: Your next appointment:  12 month(s) In Person with [name], MD    Please call our office 2 months in advance to schedule this appointment   At [facility], you and your health needs are our priority.  As part of our continuing mission to provide you with exceptional heart care, we have created designated Provider Care Teams.  These Care Teams include your primary Cardiologist (physician) and Advanced Practice Providers (APPs -  Physician Assistants and Nurse Practitioners) who all work together to provide you with the care you need, when you need it.    High-Fiber Eating Plan Fiber, also called dietary fiber, is a type of carbohydrate. It is found foods such as fruits, vegetables, whole grains, and beans. A high-fiber diet can have many health benefits. Your health care provider may recommend a high-fiber diet to help: Prevent constipation. Fiber can make your bowel movements more regular. Lower your cholesterol. Relieve the following conditions: Inflammation of veins in the anus (hemorrhoids). Inflammation of specific areas of the digestive tract (uncomplicated diverticulosis). A problem of the large intestine, also called the colon, that sometimes causes pain and diarrhea (irritable bowel syndrome, or IBS). Prevent overeating as part of a weight-loss plan. Prevent heart disease, type 2 diabetes, and certain cancers. What are tips for following this  plan? Reading food labels   Check the nutrition facts label on food products for the amount of dietary fiber. Choose foods that have 5 grams of fiber or more per serving.  Shopping Choose whole fruits and vegetables instead of processed forms, such as apple juice or applesauce. Choose a wide variety of high-fiber foods such as avocados, lentils, oats, and kidney beans. Read the nutrition facts label of the foods you choose. Be aware of foods with added fiber. These foods often have high sugar and sodium amounts per serving. Cooking Use whole-grain flour for baking and cooking. Cook with brown rice instead of white rice. Meal planning Start the day with a breakfast that is high in fiber, such as a cereal that contains 5 g of fiber or more per serving. Eat breads and cereals that are made with whole-grain flour instead of refined flour or white flour. Eat brown rice, bulgur wheat, or millet instead of white rice. Use beans in place of meat in soups, salads, and pasta dishes. Be sure that half of the grains you eat each day are whole grains. General information You can get the recommended daily intake of dietary fiber by: Eating a variety of fruits, vegetables, grains, nuts, and beans. Taking a fiber supplement if you are not able to take in enough fiber in your diet. It is better to get fiber through food than from a supplement. Gradually increase how much fiber you consume. If you increase your intake of dietary fiber too quickly, you may have bloating, cramping, or gas. Drink plenty of water to help you digest fiber. Choose high-fiber snacks, such as berries, raw vegetables, nuts, and popcorn. What foods should  I eat? Fruits Berries. Pears. Apples. Oranges. Avocado. Prunes and raisins. Dried figs. Vegetables Sweet potatoes. Spinach. Kale. Artichokes. Cabbage. Broccoli. Cauliflower. Green peas. Carrots. Squash. Grains Whole-grain breads. Multigrain cereal. Oats and oatmeal. Brown rice.  Barley. Bulgur wheat. [place]. Quinoa. Bran muffins. Popcorn. Rye wafer crackers. Meats and other proteins Navy beans, kidney beans, and pinto beans. Soybeans. Split peas. Lentils. Nuts and seeds. Dairy Fiber-fortified yogurt. Beverages Fiber-fortified soy milk. Fiber-fortified orange juice. Other foods Fiber bars. The items listed above may not be a complete list of recommended foods and beverages. Contact a dietitian for more information. What foods should I avoid? Fruits Fruit juice. Cooked, strained fruit. Vegetables Fried potatoes. Canned vegetables. Well-cooked vegetables. Grains White bread. Pasta made with refined flour. White rice. Meats and other proteins Fatty cuts of meat. Fried chicken or fried fish. Dairy Milk. Yogurt. Cream cheese. Sour cream. Fats and oils Butters. Beverages Soft drinks. Other foods Cakes and pastries. The items listed above may not be a complete list of foods and beverages to avoid. Talk with your dietitian about what choices are best for you. Summary Fiber is a type of carbohydrate. It is found in foods such as fruits, vegetables, whole grains, and beans. A high-fiber diet has many benefits. It can help to prevent constipation, lower blood cholesterol, aid weight loss, and reduce your risk of heart disease, diabetes, and certain cancers. Increase your intake of fiber gradually. Increasing fiber too quickly may cause cramping, bloating, and gas. Drink plenty of water while you increase the amount of fiber you consume. The best sources of fiber include whole fruits and vegetables, whole grains, nuts, seeds, and beans. This information is not intended to replace advice given to you by your health care provider. Make sure you discuss any questions you have with your health care provider. Document Revised: 02/27/2020 Document Reviewed: 02/27/2020 Elsevier Patient Education  2022 [employer].

## 2021-12-29 ENCOUNTER — Encounter: Payer: Self-pay | Admitting: Physical Therapy

## 2021-12-29 ENCOUNTER — Ambulatory Visit (INDEPENDENT_AMBULATORY_CARE_PROVIDER_SITE_OTHER): Payer: Medicare Other | Admitting: Physical Therapy

## 2021-12-29 ENCOUNTER — Other Ambulatory Visit: Payer: Self-pay

## 2021-12-29 ENCOUNTER — Other Ambulatory Visit: Payer: Self-pay | Admitting: *Deleted

## 2021-12-29 DIAGNOSIS — M5412 Radiculopathy, cervical region: Secondary | ICD-10-CM | POA: Diagnosis not present

## 2021-12-29 DIAGNOSIS — M6281 Muscle weakness (generalized): Secondary | ICD-10-CM | POA: Diagnosis not present

## 2021-12-29 DIAGNOSIS — R293 Abnormal posture: Secondary | ICD-10-CM

## 2021-12-29 NOTE — Therapy (Signed)
OUTPATIENT PHYSICAL THERAPY TREATMENT NOTE   Patient Name: Rick Turner MRN: [id number] DOB:[birth year], 71 y.o., male Today's Date: 12/29/2021  PCP: [name], MD REFERRING PROVIDER: Magnus Sinning, MD          Past Medical History:  Diagnosis Date   Acne rosacea    Arthritis    BPH (benign prostatic hypertrophy)    Clavicle fracture    left   Detached retina    left    Detached retina, left 2011   Fx ankle    left   GERD (gastroesophageal reflux disease)    Hyperlipidemia    Hypertension    Hypothyroidism    OSA (obstructive sleep apnea)    mild   Seasonal allergies    Sleep apnea    Weakness of both lower extremities    Past Surgical History:  Procedure Laterality Date   CATARACT EXTRACTION  08/2011   left, right 2016   detached retiina  2011   left, 2014   EYE SURGERY     lasix   HERNIA REPAIR Right    ing   TONSILLECTOMY     TOTAL SHOULDER REPLACEMENT Left 2021   Patient Active Problem List   Diagnosis Date Noted   Hyperlipidemia 04/10/2020   Rib fracture 02/09/2019   Macula-off rhegmatogenous retinal detachment 04/19/2016   Dry eye syndrome 08/20/2013   Nuclear sclerotic cataract 08/20/2013   Pseudoaphakia 08/20/2013   Detached retina 08/20/2013    REFERRING DIAG: M54.12 (ICD-10-CM) - Cervical radiculopathy M54.2 (ICD-10-CM) - Cervicalgia M79.18 (ICD-10-CM) - Myofascial pain syndrome    THERAPY DIAG:  Abnormal posture   Muscle weakness (generalized)   Radiculopathy, cervical region   ONSET DATE: Chronic (~ 6 months)    Precautions: cervical  PERTINENT HISTORY:  Arthritis, L clavicle fracture, L ankle fracture, hyperlipidemia, HTN, hypothyroid, previous TSA  PRECAUTIONS: Cervical (postural)  SUBJECTIVE:  Pt arriving reporting 2/10 pain in his right shoulder/neck. Pt feels therapy has helped and reports he is in search of a new pillow. Pt also questioning purchasing a compression band for his Rt UE.    PAIN:  Are you  having pain? Yes NPRS scale: 2/10 Pain location: R arm/neck Pain orientation: Right  PAIN TYPE: aching Pain description: constant Aggravating factors: prolonged postures and in the AM Relieving factors: activity, exercises  AROM:    12/22/2021:            Cervical rotation: right: 70 degrees, left 72 degrees           Cervical side bending: right: 30 degrees, left 30 degrees    12/17/2021: Cervical extension: 70  Cervical lateral bending (L/R): 25/30  Cervical rotation (L/R): 70/55 Strength:    12/17/2021 (was 12/03/2021) Cervical strength in pounds 33.3 pounds (Was 39.2 pounds) extension 15.9 L & 23.7 R (Was 23.0 (L)/16.5 (R) pounds) lateral bending   A/PROM AROM (deg) 12/03/2021  Flexion    Extension 70  Right lateral flexion 30  Left lateral flexion 20  Right rotation 45  Left rotation 35   (Blank rows = not tested)  TODAY'S TREATMENT:   12/29/2021:    Therapeutic Exercise:  Aerobic: UBE x 6 minutes Level 2 both forward and back Supine: shoulder flexion with 2# bar, shoulder abduction with 2# bar Prone:   Seated:    Standing:  Rows c scapular retraction holding 3 seconds Level 4 band 20X Shoulder extension Level 3 band        Manual Therapy: compression to Rt infraspinatus, Rt middle deltoid,  Rt splenius capitus and STM, active trigger point release performed during DN Trigger Point Dry Needling: Rt infraspinatus and Rt middle deltoid, Rt splenius capitus with twitch response noted c concordant symptom area.  Good tolerance with no adverse reaction.  Modalities: Cervical Traction: 27# 20 minutes cervical protocol   12/24/2021 Therapeutic Exercise:  Aerobic: Supine: Prone:   Seated:    Standing:  Rows c scapular retraction holding 3 seconds Blue band 20X Shoulder extension Red band Palms up, chin level 10X slow eccentrics                        Shoulder ER c towel under elbow Green band 10 bilateral, slow eccentrics, perfect posture   Shoulder blade pinches  10X 5 seconds   Cervical Extension Isometrics 10X 5 seconds   Cervical lateral bending isometrics 10X (5 each side) 5 seconds  Manual: compression to Rt infraspinatus, skilled palpation to Rt infraspinatus during dry needling        Trigger Point Dry Needling: Rt infraspinatus c twitch response noted c concordant symptom area.  Good tolerance with no adverse reaction  Modalities: Cervical Traction: 26# 8 minutes cervical protocol   12/22/2021 Therapeutic Exercise:  Aerobic: UBE L3 3 minutes each direction Supine: Prone:   Seated:    Standing:  Rows c scapular retraction holding 3 seconds Level 4 band 20X Shoulder flexion with 3# bar x 15 holding 3 seconds at end range.                         Rt Shoulder ER with Level 4 band 2 x 15   Shoulder blade pinches 10X 5 seconds   Rt shoulder abduction using 3# bar x 15 pain free range         Manual Therapy:  Trigger Point Dry Needling:  Modalities: Cervical Traction: 25# 20 minutes cervical protocol     PATIENT EDUCATION:    12/24/2021: Long discussion regarding sleeping posture and cervical anatomy while on traction (see subjective).  12/22/2021: Discussed changing pillows at night for neck support as well as a body pillow between arms and legs for spinal support  12/17/2021: Discussed posture, importance of frequent changes of position, updated and corrected HEP.        HOME EXERCISE PROGRAM: Access Code: [email or web address]   ASSESSMENT:   CLINICAL IMPRESSION: Pt arriving today with 2/10 pain in right shoulder and neck. Pt stating he feels like overall improvements but pain can vary depending on daily activities. Pt wishing for further DN to right UE and cervical musculature with twitch responses noted. Continue skilled PT to maximize pt's function.     REHAB POTENTIAL: Good   CLINICAL DECISION MAKING: Stable/uncomplicated   EVALUATION COMPLEXITY: Low     GOALS:    SHORT TERM GOALS:  Assessed 12/15/2021 STG Name Target  Date Goal status  1 Improve cervical AROM for lateral bending to 30/30 and rotation to 50/50 degrees.  12/31/2021 Met 12/22/2021    LONG TERM GOALS:    LTG Name Target Date Goal status  1 Improve FOTO to 63 in 11 visits. Baseline: 53 01/14/2022 Ongoing 12/22/2021  2 [name] will report neck, upper trapezius and scapular pain consistently 0-2/10 on the Numeric Pain Rating Scale with no R UE radicular symptoms. Baseline: 5/10 with radiculopathy 01/28/2022 Ongoing 12/22/2021  3 Improve cervical extension strength to 50 and lateral bending to 30 pounds. Baseline: 39 and 16-23 pounds 01/28/2022 Ongoing 12/22/2021  51 [name] will have improved postural awareness and be able to implement this into daily activities. Baseline: Stared education at evaluation 01/28/2022 Ongoing 12/22/2021  5 [name] will be independent with his long-term HEP at [place]. Baseline: Started today 01/28/2022 Ongoing 12/22/2021    PLAN: PT FREQUENCY: 1-2x/week   PT DURATION: 8 weeks   PLANNED INTERVENTIONS: Therapeutic exercises, Therapeutic activity, Neuro Muscular re-education, Patient/Family education, Joint mobilization, Dry Needling, Traction, and Manual therapy   PLAN FOR NEXT SESSION: Reassess infraspinatus/deltoid DN results. Traction up to 27#, progress prone postural strengthening.     Kearney Hard, PT, MPT 12/29/21 8:38 AM

## 2021-12-30 ENCOUNTER — Other Ambulatory Visit: Payer: Self-pay | Admitting: *Deleted

## 2021-12-30 DIAGNOSIS — R200 Anesthesia of skin: Secondary | ICD-10-CM

## 2021-12-30 DIAGNOSIS — M6281 Muscle weakness (generalized): Secondary | ICD-10-CM

## 2021-12-31 ENCOUNTER — Ambulatory Visit (INDEPENDENT_AMBULATORY_CARE_PROVIDER_SITE_OTHER): Payer: Medicare Other | Admitting: Rehabilitative and Restorative Service Providers"

## 2021-12-31 ENCOUNTER — Encounter: Payer: Self-pay | Admitting: Rehabilitative and Restorative Service Providers"

## 2021-12-31 ENCOUNTER — Other Ambulatory Visit: Payer: Self-pay

## 2021-12-31 DIAGNOSIS — M5412 Radiculopathy, cervical region: Secondary | ICD-10-CM

## 2021-12-31 DIAGNOSIS — M6281 Muscle weakness (generalized): Secondary | ICD-10-CM | POA: Diagnosis not present

## 2021-12-31 DIAGNOSIS — R293 Abnormal posture: Secondary | ICD-10-CM | POA: Diagnosis not present

## 2021-12-31 NOTE — Therapy (Signed)
OUTPATIENT PHYSICAL THERAPY TREATMENT NOTE   Patient Name: Rick Turner MRN: [id number] DOB:Mar 26, 1951, 71 y.o., male Today's Date: 12/31/2021  PCP: [name], MD REFERRING PROVIDER: Magnus Sinning, MD   PT End of Session - 12/31/21 0809     Visit Number 9    Number of Visits 16    Date for PT Re-Evaluation 01/28/22    Authorization Type Medicare    Progress Note Due on Visit 10    PT Start Time 0800    PT Stop Time 0856    PT Time Calculation (min) 56 min    Activity Tolerance Patient tolerated treatment well;No increased pain    Behavior During Therapy WFL for tasks assessed/performed                   Past Medical History:  Diagnosis Date   Acne rosacea    Arthritis    BPH (benign prostatic hypertrophy)    Clavicle fracture    left   Detached retina    left    Detached retina, left 2011   Fx ankle    left   GERD (gastroesophageal reflux disease)    Hyperlipidemia    Hypertension    Hypothyroidism    OSA (obstructive sleep apnea)    mild   Seasonal allergies    Sleep apnea    Weakness of both lower extremities    Past Surgical History:  Procedure Laterality Date   CATARACT EXTRACTION  08/2011   left, right 2016   detached retiina  2011   left, 2014   EYE SURGERY     lasix   HERNIA REPAIR Right    ing   TONSILLECTOMY     TOTAL SHOULDER REPLACEMENT Left 2021   Patient Active Problem List   Diagnosis Date Noted   Hyperlipidemia 04/10/2020   Rib fracture 02/09/2019   Macula-off rhegmatogenous retinal detachment 04/19/2016   Dry eye syndrome 08/20/2013   Nuclear sclerotic cataract 08/20/2013   Pseudoaphakia 08/20/2013   Detached retina 08/20/2013    REFERRING DIAG: M54.12 (ICD-10-CM) - Cervical radiculopathy M54.2 (ICD-10-CM) - Cervicalgia M79.18 (ICD-10-CM) - Myofascial pain syndrome    THERAPY DIAG:  Abnormal posture   Muscle weakness (generalized)   Radiculopathy, cervical region   ONSET DATE: Chronic (~ 6 months)     Precautions: cervical  PERTINENT HISTORY:  Arthritis, L clavicle fracture, L ankle fracture, hyperlipidemia, HTN, hypothyroid, previous TSA  PRECAUTIONS: Cervical (postural)  SUBJECTIVE:  [name] notes night pain has been "significantly reduced" since starting PT.  He still is occasionally awakened by pain.  Cervical AROM and neck pain are significantly improved.  Pain is mostly anterior deltoid and no more scapular pain.   PAIN:  Are you having pain? Yes NPRS scale: 2/10 Pain location: R anterior shoulder Pain orientation: Right  PAIN TYPE: aching Pain description: constant Aggravating factors: prolonged postures, in the AM and sometimes at night Relieving factors: activity, exercises  AROM:  12/31/2021 Cervical rotation (L/R in degrees):   60/65 Cervical lateral bending (L/R in degrees):  25/25 Cervical extension (in degrees):   70  Shoulder flexion (L/R in degrees):   170/170 Shoulder ER (L/R in degrees):  65/75 Shoulder IR (L/R in degrees):   75/30 Horizontal adduction (L/R in degrees):  45/40     12/22/2021:            Cervical rotation: right: 70 degrees, left 72 degrees           Cervical side bending: right: 30 degrees,  left 30 degrees    12/17/2021: Cervical extension: 70  Cervical lateral bending (L/R): 25/30  Cervical rotation (L/R): 70/55 Strength:   12/31/2021 (in pounds with hand held dynamometer)  Cervical extension -    36.3 pounds Cervical lateral bending (L/R) -  21.4/36.2 Shoulder ER (L/R) -    31.4/15.9 Shoulder IR (L/R) -   34.1/28.0   12/17/2021 (was 12/03/2021) Cervical strength in pounds 33.3 pounds (Was 39.2 pounds) extension 15.9 L & 23.7 R (Was 23.0 (L)/16.5 (R) pounds) lateral bending   A/PROM AROM (deg) 12/03/2021  Flexion    Extension 70  Right lateral flexion 30  Left lateral flexion 20  Right rotation 45  Left rotation 35   (Blank rows = not tested)  TODAY'S TREATMENT:   12/31/2021:  Therapeutic  Exercise:  Aerobic: Supine: Prone:   Seated:    Standing:    Thumb up the back stretch 10X 10 seconds    Posterior capsule stretch 10X 10 seconds  Rows c scapular retraction holding 3 seconds Blue band 20X (next visit)  Shoulder extension Red band Palms up, chin level 10X slow eccentrics (next visit)  Shoulder ER c towel under elbow Green band 10 bilateral, slow eccentrics, perfect posture (multiple sets next visit)    Shoulder blade pinches 10X 5 seconds    Cervical Extension Isometrics 10X 5 seconds  Cervical lateral bending isometrics 10X (5 each side) 5 seconds  Manual: compression to Rt infraspinatus, skilled palpation to Rt infraspinatus during dry needling        Trigger Point Dry Needling: Rt infraspinatus, posterior deltoid and middle deltoid c twitch response noted c concordant symptom area.  Good tolerance with no adverse reaction.  DN performed by Elsie Ra PT,DPT.   Modalities: Cervical Traction: 28# 8 minutes cervical protocol   12/29/2021:    Therapeutic Exercise:  Aerobic: UBE x 6 minutes Level 2 both forward and back Supine: shoulder flexion with 2# bar, shoulder abduction with 2# bar Prone:   Seated:    Standing:  Rows c scapular retraction holding 3 seconds Level 4 band 20X Shoulder extension Level 3 band        Manual Therapy: compression to Rt infraspinatus, Rt middle deltoid, Rt splenius capitus and STM, active trigger point release performed during DN Trigger Point Dry Needling: Rt infraspinatus and Rt middle deltoid, Rt splenius capitus with twitch response noted c concordant symptom area.  Good tolerance with no adverse reaction.  Modalities: Cervical Traction: 27# 20 minutes cervical protocol   12/24/2021 Therapeutic Exercise:  Aerobic: Supine: Prone:   Seated:    Standing:  Rows c scapular retraction holding 3 seconds Blue band 20X Shoulder extension Red band Palms up, chin level 10X slow eccentrics                        Shoulder ER  c towel under elbow Green band 10 bilateral, slow eccentrics, perfect posture   Shoulder blade pinches 10X 5 seconds   Cervical Extension Isometrics 10X 5 seconds   Cervical lateral bending isometrics 10X (5 each side) 5 seconds  Manual: compression to Rt infraspinatus, skilled palpation to Rt infraspinatus during dry needling        Trigger Point Dry Needling: Rt infraspinatus c twitch response noted c concordant symptom area.  Good tolerance with no adverse reaction  Modalities: Cervical Traction: 26# 8 minutes cervical protocol    PATIENT EDUCATION:   12/31/2021: Reviewed reassessment findings and updated HEP.  Discussed (did  not perform) select shoulder activities.  Discussed anatomy and mechanism of shoulder impingement.   12/24/2021: Long discussion regarding sleeping posture and cervical anatomy while on traction (see subjective).  12/22/2021: Discussed changing pillows at night for neck support as well as a body pillow between arms and legs for spinal support      HOME EXERCISE PROGRAM: Access Code: [email or web address] URL: [email or web address] Date: 12/31/2021 Prepared by: Vista Mink  Exercises Standing Scapular Retraction - 3-5 x daily - 7 x weekly - 1 sets - 5 reps - 5 second hold Standing Isometric Cervical Extension with Manual Resistance - 3-5 x daily - 7 x weekly - 1 sets - 5 reps - 5 hold Standing Isometric Cervical Sidebending with Manual Resistance - 2-3 x daily - 7 x weekly - 1 sets - 10 reps - 5 hold Seated Upper Trapezius Stretch - 2 x daily - 3 x weekly - 1 sets - 5 reps - 15 hold Standing Shoulder Row with Anchored Resistance - 1 x daily - 7 x weekly - 2 sets - 20 reps Shoulder Extension with Resistance - 1 x daily - 7 x weekly - 2 sets - 10 reps Shoulder External Rotation with Anchored Resistance (Mirrored) - 1 x daily - 7 x weekly - 3 sets - 10 reps Standing Shoulder Internal Rotation Stretch with Hands Behind Back - 2-3 x daily - 7 x weekly - 1 sets  - 10 reps - 10 seconds hold  ASSESSMENT:   CLINICAL IMPRESSION: [name] is making great progress with his cervical rehabilitation.  Symptoms noted at evaluation are much better.  [name] notes night pain has been "significantly reduced" since starting PT.  He still is occasionally awakened by pain.  Cervical AROM and neck pain are significantly improved.  Pain is mostly anterior deltoid and no more scapular pain.  [name] does have significant R posterior capsular tightness and posterior rotator cuff weakness that will benefit from another month of PT.  Shoulder AROM is a separate issue and weakness may or may not be related to his cervical spine.  I anticipate transfer into independent rehabilitation in 4 weeks.    REHAB POTENTIAL: Good   CLINICAL DECISION MAKING: Stable/uncomplicated   EVALUATION COMPLEXITY: Low     GOALS:    SHORT TERM GOALS:  Assessed 12/15/2021 STG Name Target Date Goal status  1 Improve cervical AROM for lateral bending to 30/30 and rotation to 50/50 degrees.  12/31/2021  Partally Met Rotation yes, lateral bending no     LONG TERM GOALS:    LTG Name Target Date Goal status  1 Improve FOTO to 63 in 11 visits. Baseline: 53 01/14/2022 Met 87  2 [name] will report neck, upper trapezius and scapular pain consistently 0-2/10 on the Numeric Pain Rating Scale with no R UE radicular symptoms. Baseline: 5/10 with radiculopathy 01/28/2022 Met (remaining arm pain appears to be impingement)  3 Improve cervical extension strength to 50 and lateral bending to 30 pounds. Baseline: 39 and 16-23 pounds 01/28/2022 Ongoing 12/31/2021  4 [name] will have improved postural awareness and be able to implement this into daily activities. Baseline: Stared education at evaluation 01/28/2022 Met  5 [name] will be independent with his long-term HEP at [place]. Baseline: Started today 01/28/2022 Ongoing 12/31/2021    PLAN: PT FREQUENCY: 1X/week   PT DURATION: 4 weeks   PLANNED INTERVENTIONS: Therapeutic  exercises, Therapeutic activity, Neuro Muscular re-education, Patient/Family education, Joint mobilization, Dry Needling, Traction, and Manual therapy   PLAN FOR NEXT SESSION: Continue  cervical traction, possibly dry needling.  Cervical activities at home and progress shoulder rehabilitation (posterior capsule tightness and posterior RTC weakness).    Farley Ly, PT, MPT 12/31/21 9:10 AM

## 2022-01-03 ENCOUNTER — Telehealth: Payer: Self-pay | Admitting: Diagnostic Neuroimaging

## 2022-01-03 NOTE — Telephone Encounter (Signed)
Referral sent to [facility] [phone] 

## 2022-01-04 DIAGNOSIS — I10 Essential (primary) hypertension: Secondary | ICD-10-CM | POA: Diagnosis not present

## 2022-01-07 ENCOUNTER — Ambulatory Visit: Payer: Medicare Other | Admitting: General Practice

## 2022-01-13 ENCOUNTER — Other Ambulatory Visit: Payer: Self-pay

## 2022-01-13 ENCOUNTER — Ambulatory Visit (INDEPENDENT_AMBULATORY_CARE_PROVIDER_SITE_OTHER): Payer: Medicare Other | Admitting: Rehabilitative and Restorative Service Providers"

## 2022-01-13 ENCOUNTER — Encounter: Payer: Self-pay | Admitting: Rehabilitative and Restorative Service Providers"

## 2022-01-13 DIAGNOSIS — R293 Abnormal posture: Secondary | ICD-10-CM

## 2022-01-13 DIAGNOSIS — M5412 Radiculopathy, cervical region: Secondary | ICD-10-CM | POA: Diagnosis not present

## 2022-01-13 DIAGNOSIS — M6281 Muscle weakness (generalized): Secondary | ICD-10-CM

## 2022-01-13 NOTE — Therapy (Signed)
OUTPATIENT PHYSICAL THERAPY TREATMENT NOTE   Patient Name: Rick Turner MRN: [id number] DOB:31-May-1951, 71 y.o., male Today's Date: 01/13/2022  PCP: [name], MD REFERRING PROVIDER: Magnus Sinning, MD   PT End of Session - 01/13/22 0849     Visit Number 10    Number of Visits 16    Date for PT Re-Evaluation 01/28/22    Authorization Type Medicare    Progress Note Due on Visit 10    PT Start Time 0802    PT Stop Time 6433    PT Time Calculation (min) 45 min    Activity Tolerance Patient tolerated treatment well;No increased pain    Behavior During Therapy WFL for tasks assessed/performed                    Past Medical History:  Diagnosis Date   Acne rosacea    Arthritis    BPH (benign prostatic hypertrophy)    Clavicle fracture    left   Detached retina    left    Detached retina, left 2011   Fx ankle    left   GERD (gastroesophageal reflux disease)    Hyperlipidemia    Hypertension    Hypothyroidism    OSA (obstructive sleep apnea)    mild   Seasonal allergies    Sleep apnea    Weakness of both lower extremities    Past Surgical History:  Procedure Laterality Date   CATARACT EXTRACTION  08/2011   left, right 2016   detached retiina  2011   left, 2014   EYE SURGERY     lasix   HERNIA REPAIR Right    ing   TONSILLECTOMY     TOTAL SHOULDER REPLACEMENT Left 2021   Patient Active Problem List   Diagnosis Date Noted   Hyperlipidemia 04/10/2020   Rib fracture 02/09/2019   Macula-off rhegmatogenous retinal detachment 04/19/2016   Dry eye syndrome 08/20/2013   Nuclear sclerotic cataract 08/20/2013   Pseudoaphakia 08/20/2013   Detached retina 08/20/2013    REFERRING DIAG: M54.12 (ICD-10-CM) - Cervical radiculopathy M54.2 (ICD-10-CM) - Cervicalgia M79.18 (ICD-10-CM) - Myofascial pain syndrome    THERAPY DIAG:  Abnormal posture   Muscle weakness (generalized)   Radiculopathy, cervical region   ONSET DATE: Chronic (~ 6  months)    Precautions: cervical  PERTINENT HISTORY:  Arthritis, L clavicle fracture, L ankle fracture, hyperlipidemia, HTN, hypothyroid, previous TSA  PRECAUTIONS: Cervical (postural)  SUBJECTIVE:  Rick Turner notes neck pain has been "0-2/10" since his last PT visit.  He still is occasionally awakened by R arm pain.  Cervical AROM and neck pain are significantly improved.  Pain is mostly anterior deltoid and no more scapular pain.   PAIN:  Are you having pain? Yes NPRS scale: 1-7/10 Pain location: R anterior shoulder Pain orientation: Right  PAIN TYPE: aching Pain description: constant Aggravating factors: prolonged postures, in the AM and sometimes at night Relieving factors: activity, exercises  AROM:   12/31/2021 Cervical rotation (L/R in degrees):   60/65 Cervical lateral bending (L/R in degrees):  25/25 Cervical extension (in degrees):   70  Shoulder flexion (L/R in degrees):   170/170 Shoulder ER (L/R in degrees):  65/75 Shoulder IR (L/R in degrees):   75/30 Horizontal adduction (L/R in degrees):  45/40      12/22/2021:            Cervical rotation: right: 70 degrees, left 72 degrees  Cervical side bending: right: 30 degrees, left 30 degrees    12/17/2021: Cervical extension: 70  Cervical lateral bending (L/R): 25/30  Cervical rotation (L/R): 70/55  Strength:    12/31/2021 (in pounds with hand held dynamometer)  Cervical extension -    36.3 pounds Cervical lateral bending (L/R) -  21.4/36.2 Shoulder ER (L/R) -    31.4/15.9 Shoulder IR (L/R) -   34.1/28.0   12/17/2021 (was 12/03/2021) Cervical strength in pounds 33.3 pounds (Was 39.2 pounds) extension 15.9 L & 23.7 R (Was 23.0 (L)/16.5 (R) pounds) lateral bending   A/PROM AROM (deg) 12/03/2021  Flexion    Extension 70  Right lateral flexion 30  Left lateral flexion 20  Right rotation 45  Left rotation 35   (Blank rows = not tested)   TODAY'S TREATMENT:  01/13/2022: Therapeutic  Exercise:  Aerobic:  Supine: Supine IR stretch (at 70 degrees abduction on 2 pillows) 30X 10 seconds  Prone:    Seated:     Standing:    Thumb up the back stretch 10X 10 seconds    Posterior capsule stretch 10X 10 seconds  Rows c scapular retraction holding 3 seconds Blue band 20X (next visit)  Shoulder ER c towel under elbow Red band 10X R, slow eccentrics, perfect posture (3 sets)  Shoulder IR c towel under elbow Green band 10 R only, slow eccentrics, perfect posture   Shoulder blade pinches 10X 5 seconds    Cervical Extension Isometrics 10X 5 seconds  Cervical lateral bending isometrics 10X (5 each side) 5 seconds   12/31/2021:  Therapeutic Exercise:  Aerobic: Supine: Prone:   Seated:    Standing:    Thumb up the back stretch 10X 10 seconds    Posterior capsule stretch 10X 10 seconds  Rows c scapular retraction holding 3 seconds Blue band 20X (next visit)  Shoulder extension Red band Palms up, chin level 10X slow eccentrics (next visit)  Shoulder ER c towel under elbow Green band 10 bilateral, slow eccentrics, perfect posture (multiple sets next visit)    Shoulder blade pinches 10X 5 seconds    Cervical Extension Isometrics 10X 5 seconds  Cervical lateral bending isometrics 10X (5 each side) 5 seconds  Manual: compression to Rt infraspinatus, skilled palpation to Rt infraspinatus during dry needling        Trigger Point Dry Needling: Rt infraspinatus, posterior deltoid and middle deltoid c twitch response noted c concordant symptom area.  Good tolerance with no adverse reaction.  DN performed by Elsie Ra PT,DPT.   Modalities: Cervical Traction: 28# 8 minutes cervical protocol   12/29/2021:    Therapeutic Exercise:  Aerobic: UBE x 6 minutes Level 2 both forward and back Supine: shoulder flexion with 2# bar, shoulder abduction with 2# bar Prone:   Seated:    Standing:  Rows c scapular retraction holding 3 seconds Level 4 band 20X Shoulder  extension Level 3 band        Manual Therapy: compression to Rt infraspinatus, Rt middle deltoid, Rt splenius capitus and STM, active trigger point release performed during DN Trigger Point Dry Needling: Rt infraspinatus and Rt middle deltoid, Rt splenius capitus with twitch response noted c concordant symptom area.  Good tolerance with no adverse reaction.  Modalities: Cervical Traction: 27# 20 minutes cervical protocol    PATIENT EDUCATION:   12/31/2021: Reviewed reassessment findings and updated HEP.  Discussed (did not perform) select shoulder activities.  Discussed anatomy and mechanism of shoulder impingement.   12/24/2021: Long discussion regarding  sleeping posture and cervical anatomy while on traction (see subjective).  12/22/2021: Discussed changing pillows at night for neck support as well as a body pillow between arms and legs for spinal support      HOME EXERCISE PROGRAM: Access Code: [email or web address] URL: [email or web address] Date: 01/13/2022 Prepared by: Vista Mink  Exercises Standing Scapular Retraction - 3-5 x daily - 7 x weekly - 1 sets - 5 reps - 5 second hold Standing Isometric Cervical Extension with Manual Resistance - 3-5 x daily - 7 x weekly - 1 sets - 5 reps - 5 hold Standing Isometric Cervical Sidebending with Manual Resistance - 2-3 x daily - 7 x weekly - 1 sets - 10 reps - 5 hold Seated Upper Trapezius Stretch - 2 x daily - 1 x weekly - 1 sets - 5 reps - 15 hold Standing Shoulder Row with Anchored Resistance - 1 x daily - 7 x weekly - 2 sets - 20 reps Shoulder Extension with Resistance - 1 x daily - 1 x weekly - 2 sets - 10 reps Shoulder External Rotation with Anchored Resistance (Mirrored) - 1 x daily - 7 x weekly - 3-4 sets - 10 reps Standing Shoulder Internal Rotation Stretch with Hands Behind Back - 2-3 x daily - 7 x weekly - 1 sets - 10 reps - 10 seconds hold Standing Shoulder Posterior Capsule Stretch - 2-3 x daily - 7 x weekly - 1 sets - 10  reps - 10 hold Supine Shoulder Internal Rotation Stretch - 2 x daily - 7 x weekly - 1 sets - 10-20 reps - 10 seconds hold Shoulder Internal Rotation with Resistance - 1 x daily - 7 x weekly - 1-2 sets - 10 reps   ASSESSMENT:    CLINICAL IMPRESSION: [name] is making great progress with his cervical rehabilitation.  Symptoms noted at evaluation are much better.  Rick Turner notes night pain has been "R arm only" since his last PT visit.  He still is occasionally awakened by pain.  Cervical AROM and neck pain are significantly improved.  Pain is anterior deltoid and no more scapular or neck pain.  [name] does have significant R posterior capsular tightness and posterior rotator cuff weakness that is being addressed with his PT.  Treatment is now focused on his shoulder vs his cervical spine.  I anticipate transfer into independent rehabilitation in 4 weeks.    REHAB POTENTIAL: Good   CLINICAL DECISION MAKING: Stable/uncomplicated   EVALUATION COMPLEXITY: Low     GOALS:    SHORT TERM GOALS:  Assessed 12/15/2021 STG Name Target Date Goal status  1 Improve cervical AROM for lateral bending to 30/30 and rotation to 50/50 degrees.  12/31/2021  Partally Met Rotation yes, lateral bending no     LONG TERM GOALS:    LTG Name Target Date Goal status  1 Improve FOTO to 63 in 11 visits. Baseline: 53 01/14/2022 Met 87  2 Rick Turner will report neck, upper trapezius and scapular pain consistently 0-2/10 on the Numeric Pain Rating Scale with no R UE radicular symptoms. Baseline: 5/10 with radiculopathy 01/28/2022 Met (remaining arm pain appears to be impingement)  3 Improve cervical extension strength to 50 and lateral bending to 30 pounds. Baseline: 39 and 16-23 pounds 01/28/2022 Ongoing 12/31/2021  4 Rick Turner will have improved postural awareness and be able to implement this into daily activities. Baseline: Stared education at evaluation 01/28/2022 Met  5 [name] will be independent with his long-term HEP at [facility]. Baseline:  Started today 01/28/2022  Ongoing 12/31/2021    PLAN: PT FREQUENCY: 1X/week   PT DURATION: 4 weeks   PLANNED INTERVENTIONS: Therapeutic exercises, Therapeutic activity, Neuro Muscular re-education, Patient/Family education, Joint mobilization, Dry Needling, Traction, and Manual therapy   PLAN FOR NEXT SESSION: Progress shoulder rehabilitation (posterior capsule tightness, scapular and posterior RTC weakness).    Farley Ly, PT, MPT 01/13/22 8:50 AM

## 2022-01-14 ENCOUNTER — Encounter: Payer: Medicare Other | Admitting: Rehabilitative and Restorative Service Providers"

## 2022-01-19 ENCOUNTER — Encounter: Payer: Self-pay | Admitting: Orthopedic Surgery

## 2022-01-19 ENCOUNTER — Other Ambulatory Visit: Payer: Self-pay

## 2022-01-19 ENCOUNTER — Ambulatory Visit (INDEPENDENT_AMBULATORY_CARE_PROVIDER_SITE_OTHER): Payer: Medicare Other

## 2022-01-19 ENCOUNTER — Ambulatory Visit (INDEPENDENT_AMBULATORY_CARE_PROVIDER_SITE_OTHER): Payer: Medicare Other | Admitting: Orthopedic Surgery

## 2022-01-19 DIAGNOSIS — I2510 Atherosclerotic heart disease of native coronary artery without angina pectoris: Secondary | ICD-10-CM

## 2022-01-19 DIAGNOSIS — G8929 Other chronic pain: Secondary | ICD-10-CM

## 2022-01-19 DIAGNOSIS — M25511 Pain in right shoulder: Secondary | ICD-10-CM

## 2022-01-19 DIAGNOSIS — M19011 Primary osteoarthritis, right shoulder: Secondary | ICD-10-CM

## 2022-01-19 MED ORDER — LIDOCAINE HCL 1 % IJ SOLN
5.0000 mL | INTRAMUSCULAR | Status: AC | PRN
  Administered 2022-01-19: 5 mL

## 2022-01-19 MED ORDER — BUPIVACAINE HCL 0.5 % IJ SOLN
9.0000 mL | INTRAMUSCULAR | Status: AC | PRN
  Administered 2022-01-19: 9 mL via INTRA_ARTICULAR

## 2022-01-19 MED ORDER — METHYLPREDNISOLONE ACETATE 40 MG/ML IJ SUSP
40.0000 mg | INTRAMUSCULAR | Status: AC | PRN
  Administered 2022-01-19: 40 mg via INTRA_ARTICULAR

## 2022-01-19 NOTE — Progress Notes (Signed)
? ?Office Visit Note ?  ?Patient: Rick Turner           ?Date of Birth: 1951/07/20           ?MRN: [id number] ?Visit Date: 01/19/2022 ?Requested by: [name], MD ?[place] ?[address] ?[place],  [place] [address] ?PCP: [name], MD ? ?Subjective: ?Chief Complaint  ?Patient presents with  ? Right Shoulder - Pain  ? ? ?HPI: Rick Turner is a 71 year old patient with right shoulder pain.  Has known history of right shoulder arthritis.  Has some radiation down the arm but not below the elbow.  She has had physical therapy upstairs as well as 2 epidural steroid injections by Dr. Ernestina Patches.  No prior surgery on the right shoulder.  Does have left reverse shoulder replacement.  Overall he is doing well with his gel shots in the knees.  Does describe pain with lifting the arm and shoulder.  Localizes pain to the lateral aspect with radiation to the mid anterior aspect as well.  Pain is worse at night. ?             ?ROS: All systems reviewed are negative as they relate to the chief complaint within the history of present illness.  Patient denies  fevers or chills. ? ? ?Assessment & Plan: ?Visit Diagnoses:  ?1. Chronic right shoulder pain   ? ? ?Plan: Impression is severe right shoulder arthritis with relatively well-maintained range of motion.  Plan is intra-articular glenohumeral joint injection today.  We will see if that helps him.  Could can consider doing this about twice a year for symptoms.  Currently he is very functional in the shoulder but is having more pain.  He will follow-up with [place] as needed. ? ?Follow-Up Instructions: Return if symptoms worsen or fail to improve.  ? ?Orders:  ?Orders Placed This Encounter  ?Procedures  ? XR Shoulder Right  ? ?No orders of the defined types were placed in this encounter. ? ? ? ? Procedures: ?Large Joint Inj: R glenohumeral on 01/19/2022 12:18 PM ?Indications: diagnostic evaluation and pain ?Details: 18 G 1.5 in needle, posterior approach ? ?Arthrogram:  No ? ?Medications: 9 mL bupivacaine 0.5 %; 40 mg methylPREDNISolone acetate 40 MG/ML; 5 mL lidocaine 1 % ?Outcome: tolerated well, no immediate complications ?Procedure, treatment alternatives, risks and benefits explained, specific risks discussed. Consent was given by the patient. Immediately prior to procedure a time out was called to verify the correct patient, procedure, equipment, support staff and site/side marked as required. Patient was prepped and draped in the usual sterile fashion.  ? ? ? ? ?Clinical Data: ?No additional findings. ? ?Objective: ?Vital Signs: There were no vitals taken for this visit. ? ?Physical Exam:  ? ?Constitutional: Patient appears well-developed ?HEENT:  ?Head: Normocephalic ?Eyes:EOM are normal ?Neck: Normal range of motion ?Cardiovascular: Normal rate ?Pulmonary/chest: Effort normal ?Neurologic: Patient is alert ?Skin: Skin is warm ?Psychiatric: Patient has normal mood and affect ? ? ?Ortho Exam: Ortho exam demonstrates range of motion passive on the right shoulder of 45/85/170.  Rotator cuff strength is good infraspinatus supraspinatus subscap muscle testing.  Does have some coarseness and grinding with internal/external rotation of the arm at 90 degrees of abduction but no discrete AC joint tenderness is present.  No masses lymphadenopathy or skin changes noted in that shoulder girdle region. ? ?Specialty Comments:  ?No specialty comments available. ? ?Imaging: ?XR Shoulder Right ? ?Result Date: 01/19/2022 ?AP axillary outlet radiographs right shoulder reviewed.  End-stage  glenohumeral joint arthritis is present with posterior glenoid erosion consistent with B2 glenoid.  No acute fracture.  Acromiohumeral distance relatively well-maintained.  ? ? ?PMFS History: ?Patient Active Problem List  ? Diagnosis Date Noted  ? Hyperlipidemia 04/10/2020  ? Rib fracture 02/09/2019  ? Macula-off rhegmatogenous retinal detachment 04/19/2016  ? Dry eye syndrome 08/20/2013  ? Nuclear sclerotic  cataract 08/20/2013  ? Pseudoaphakia 08/20/2013  ? Detached retina 08/20/2013  ? ?Past Medical History:  ?Diagnosis Date  ? Acne rosacea   ? Arthritis   ? BPH (benign prostatic hypertrophy)   ? Clavicle fracture   ? left  ? Detached retina   ? left   ? Detached retina, left 2011  ? Fx ankle   ? left  ? GERD (gastroesophageal reflux disease)   ? Hyperlipidemia   ? Hypertension   ? Hypothyroidism   ? OSA (obstructive sleep apnea)   ? mild  ? Seasonal allergies   ? Sleep apnea   ? Weakness of both lower extremities   ?  ?Family History  ?Problem Relation Age of Onset  ? CAD Mother   ? CAD Father   ? Breast cancer Sister   ? Hypertension Brother   ? Lung cancer Brother   ? Hypertension Brother   ? Parkinson's disease Brother   ?  ?Past Surgical History:  ?Procedure Laterality Date  ? CATARACT EXTRACTION  08/2011  ? left, right 2016  ? detached retiina  2011  ? left, 2014  ? EYE SURGERY    ? lasix  ? HERNIA REPAIR Right   ? ing  ? TONSILLECTOMY    ? TOTAL SHOULDER REPLACEMENT Left 2021  ? ?Social History  ? ?Occupational History  ? Not on file  ?Tobacco Use  ? Smoking status: Never  ? Smokeless tobacco: Never  ?Substance and Sexual Activity  ? Alcohol use: Yes  ?  Alcohol/week: 6.0 - 10.0 standard drinks  ?  Types: 6 - 10 Standard drinks or equivalent per week  ?  Comment: 2-3 daily, 04/26/21 15-20 weekly  ? Drug use: Never  ? Sexual activity: Not on file  ? ? ? ? ? ?

## 2022-01-20 ENCOUNTER — Encounter: Payer: Self-pay | Admitting: Rehabilitative and Restorative Service Providers"

## 2022-01-20 ENCOUNTER — Ambulatory Visit (INDEPENDENT_AMBULATORY_CARE_PROVIDER_SITE_OTHER): Payer: Medicare Other | Admitting: Rehabilitative and Restorative Service Providers"

## 2022-01-20 DIAGNOSIS — M5412 Radiculopathy, cervical region: Secondary | ICD-10-CM | POA: Diagnosis not present

## 2022-01-20 DIAGNOSIS — R293 Abnormal posture: Secondary | ICD-10-CM | POA: Diagnosis not present

## 2022-01-20 DIAGNOSIS — M6281 Muscle weakness (generalized): Secondary | ICD-10-CM

## 2022-01-20 NOTE — Therapy (Signed)
?OUTPATIENT PHYSICAL THERAPY TREATMENT NOTE ? ? ?Patient Name: Rick Turner ?MRN: [id number] ?DOB:Dec 06, 1950, 71 y.o., male ?Today's Date: 01/20/2022 ? ?PCP: [name], MD ?REFERRING PROVIDER: Magnus Sinning, MD ? ? PT End of Session - 01/20/22 0906   ? ? Visit Number 11   ? Number of Visits 16   ? Date for PT Re-Evaluation 01/28/22   ? Authorization Type Medicare   ? Progress Note Due on Visit 10   ? PT Start Time [phone]   ? PT Stop Time 0929   ? PT Time Calculation (min) 43 min   ? Activity Tolerance Patient tolerated treatment well;No increased pain   ? Behavior During Therapy [facility] for tasks assessed/performed   ? ?  ?  ? ?  ? ? ? ? ? ? ? ? ? ? ?Past Medical History:  ?Diagnosis Date  ? Acne rosacea   ? Arthritis   ? BPH (benign prostatic hypertrophy)   ? Clavicle fracture   ? left  ? Detached retina   ? left   ? Detached retina, left 2011  ? Fx ankle   ? left  ? GERD (gastroesophageal reflux disease)   ? Hyperlipidemia   ? Hypertension   ? Hypothyroidism   ? OSA (obstructive sleep apnea)   ? mild  ? Seasonal allergies   ? Sleep apnea   ? Weakness of both lower extremities   ? ?Past Surgical History:  ?Procedure Laterality Date  ? CATARACT EXTRACTION  08/2011  ? left, right 2016  ? detached retiina  2011  ? left, 2014  ? EYE SURGERY    ? lasix  ? HERNIA REPAIR Right   ? ing  ? TONSILLECTOMY    ? TOTAL SHOULDER REPLACEMENT Left 2021  ? ?Patient Active Problem List  ? Diagnosis Date Noted  ? Hyperlipidemia 04/10/2020  ? Rib fracture 02/09/2019  ? Macula-off rhegmatogenous retinal detachment 04/19/2016  ? Dry eye syndrome 08/20/2013  ? Nuclear sclerotic cataract 08/20/2013  ? Pseudoaphakia 08/20/2013  ? Detached retina 08/20/2013  ? ? ?REFERRING DIAG: M54.12 (ICD-10-CM) - Cervical radiculopathy M54.2 (ICD-10-CM) - Cervicalgia M79.18 (ICD-10-CM) - Myofascial pain syndrome  ?  ?THERAPY DIAG:  ?Abnormal posture ?  ?Muscle weakness (generalized) ?  ?Radiculopathy, cervical region ?  ?ONSET DATE: Chronic (~ 6  months) ?  ? ?Precautions: cervical ? ?PERTINENT HISTORY:  ?Arthritis, L clavicle fracture, L ankle fracture, hyperlipidemia, HTN, hypothyroid, previous TSA ? ?PRECAUTIONS: Cervical (postural) ? ?SUBJECTIVE:  ?Rick Turner notes neck pain has been "0-1/10" since his last PT visit.  He still is occasionally awakened by R upper arm pain.  Cervical AROM and neck pain are significantly improved.  Pain is mostly lateral and anterior deltoid and no more scapular pain. ?  ?PAIN:  ?Are you having pain? Yes ?NPRS scale: 0-4/10 ?Pain location: R anterior shoulder and lateral arm ?Pain orientation: Right  ?PAIN TYPE: aching ?Pain description: constant ?Aggravating factors: At night, reaching out and across the body ?Relieving factors: activity, exercises ? ?AROM: ?  3.16/2023 ?Cervical rotation (L/R in degrees):   65/65 ?Cervical lateral bending (L/R in degrees):  30/30 ?Cervical extension (in degrees):   80 ? ? ?12/31/2021 ?Cervical rotation (L/R in degrees):   60/65 ?Cervical lateral bending (L/R in degrees):  25/25 ?Cervical extension (in degrees):   70 ? ?Shoulder flexion (L/R in degrees):   170/170 ?Shoulder ER (L/R in degrees):  65/75 ?Shoulder IR (L/R in degrees):   75/30 ?Horizontal adduction (L/R in degrees):  45/40 ? ? ?  12/22/2021:  ?          Cervical rotation: right: 70 degrees, left 72 degrees ?          Cervical side bending: right: 30 degrees, left 30 degrees ?  ? 12/17/2021: ?Cervical extension: 70 ? Cervical lateral bending (L/R): 25/30 ? Cervical rotation (L/R): 70/55 ? ? ?Strength: ?  ?01/20/2022 (in pounds with hand held dynamometer) ?R shoulder ER: 22.9 pounds (was 15.9) ?R shoulder IR: 37.6 pounds (was 28.0) ? ? ?12/31/2021 (in pounds with hand held dynamometer) ? ?Cervical extension -    36.3 pounds ?Cervical lateral bending (L/R) -  21.4/36.2 ?Shoulder ER (L/R) -    31.4/15.9 ?Shoulder IR (L/R) -   34.1/28.0 ? ? ? ? 12/17/2021 (was 12/03/2021) ?Cervical strength in pounds ?33.3 pounds (Was 39.2 pounds)  extension ?15.9 L & 23.7 R (Was 23.0 (L)/16.5 (R) pounds) lateral bending  ? ?A/PROM AROM (deg) ?12/03/2021  ?Flexion    ?Extension 70  ?Right lateral flexion 30  ?Left lateral flexion 20  ?Right rotation 45  ?Left rotation 35  ? (Blank rows = not tested) ? ? ?TODAY'S TREATMENT:  ?01/20/2022: ?Therapeutic Exercise: ? Aerobic: ? ?Supine: Supine IR stretch (at 70 degrees abduction on 2 pillows) 30X 10 seconds ? ?Supine arm raises 20X 3 seconds ? ?Prone:  ? ? Seated:   ? ? Standing:  ?  Thumb up the back stretch 10X 10 seconds ? ?  Posterior capsule stretch 10X 10 seconds ? ?Rows c scapular retraction holding 3 seconds Blue band 20X ? ?Shoulder ER c towel under elbow Green band 10X R, slow eccentrics, perfect posture (3 sets) ? ?Shoulder IR c towel under elbow Green band 10 R only, slow eccentrics, perfect posture  ? ?Shoulder blade pinches 10X 5 seconds ? ?  Cervical Extension Isometrics 10X 5 seconds ? ?Cervical lateral bending isometrics 10X (5 each side) 5 seconds (next visit) ? ?Body blade 3X 20 seconds IR/ER elbow by side; up/down elbow by side; punch out ? ? ?01/13/2022: ?Therapeutic Exercise: ? Aerobic: ? ?Supine: Supine IR stretch (at 70 degrees abduction on 2 pillows) 30X 10 seconds ? ?Prone:  ? ? Seated:   ? ? Standing:  ?  Thumb up the back stretch 10X 10 seconds ? ?  Posterior capsule stretch 10X 10 seconds ? ?Rows c scapular retraction holding 3 seconds Blue band 20X (next visit) ? ?Shoulder ER c towel under elbow Red band 10X R, slow eccentrics, perfect posture (3 sets) ? ?Shoulder IR c towel under elbow Green band 10 R only, slow eccentrics, perfect posture  ? ?Shoulder blade pinches 10X 5 seconds ? ?  Cervical Extension Isometrics 10X 5 seconds ? ?Cervical lateral bending isometrics 10X (5 each side) 5 seconds ? ? ?12/31/2021: ? ?Therapeutic Exercise: ? Aerobic: ?Supine: ?Prone:  ? Seated:   ? Standing:  ?  Thumb up the back stretch 10X 10 seconds ? ?  Posterior capsule stretch 10X 10 seconds ? ?Rows c  scapular retraction holding 3 seconds Blue band 20X (next visit) ? ?Shoulder extension Red band Palms up, chin level 10X slow eccentrics (next visit) ? ?Shoulder ER c towel under elbow Green band 10 bilateral, slow eccentrics, perfect posture (multiple sets next visit) ? ?  Shoulder blade pinches 10X 5 seconds ? ?  Cervical Extension Isometrics 10X 5 seconds ? ?Cervical lateral bending isometrics 10X (5 each side) 5 seconds ? ?Manual: compression to Rt infraspinatus, skilled palpation to Rt infraspinatus during dry needling ?       ?  Trigger Point Dry Needling: Rt infraspinatus, posterior deltoid and middle deltoid c twitch response noted c concordant symptom area.  Good tolerance with no adverse reaction.  DN performed by Elsie Ra PT,DPT. ? ? ?Modalities: Cervical Traction: 28# 8 minutes cervical protocol ? ? ? ?PATIENT EDUCATION:  ? ?12/31/2021: Reviewed reassessment findings and updated HEP.  Discussed (did not perform) select shoulder activities.  Discussed anatomy and mechanism of shoulder impingement. ?  ?12/24/2021: Long discussion regarding sleeping posture and cervical anatomy while on traction (see subjective). ? ?12/22/2021: Discussed changing pillows at night for neck support as well as a body pillow between arms and legs for spinal support ? ?  ?HOME EXERCISE PROGRAM: ?Access Code: [email or web address] ?URL: [email or web address] ?Date: 01/20/2022 ?Prepared by: Vista Mink ? ?Exercises ?Standing Scapular Retraction - 3-5 x daily - 7 x weekly - 1 sets - 5 reps - 5 second hold ?Standing Isometric Cervical Extension with Manual Resistance - 3-5 x daily - 7 x weekly - 1 sets - 5 reps - 5 hold ?Standing Isometric Cervical Sidebending with Manual Resistance - 2-3 x daily - 7 x weekly - 1 sets - 10 reps - 5 hold ?Seated Upper Trapezius Stretch - 2 x daily - 1 x weekly - 1 sets - 5 reps - 15 hold ?Standing Shoulder Row with Anchored Resistance - 1 x daily - 7 x weekly - 1 sets - 20 reps ?Shoulder Extension  with Resistance - 1 x daily - 1 x weekly - 2 sets - 10 reps ?Shoulder External Rotation with Anchored Resistance (Mirrored) - 1 x daily - 7 x weekly - 3-4 sets - 10 reps ?Standing Shoulder Internal Rotation Stretch with Lamonte Sakai

## 2022-01-21 ENCOUNTER — Encounter: Payer: Medicare Other | Admitting: Rehabilitative and Restorative Service Providers"

## 2022-01-26 ENCOUNTER — Ambulatory Visit: Payer: Medicare Other | Admitting: Orthopedic Surgery

## 2022-01-26 DIAGNOSIS — R350 Frequency of micturition: Secondary | ICD-10-CM | POA: Diagnosis not present

## 2022-01-26 DIAGNOSIS — M6281 Muscle weakness (generalized): Secondary | ICD-10-CM | POA: Diagnosis not present

## 2022-01-26 DIAGNOSIS — M25551 Pain in right hip: Secondary | ICD-10-CM | POA: Diagnosis not present

## 2022-01-26 DIAGNOSIS — R300 Dysuria: Secondary | ICD-10-CM | POA: Diagnosis not present

## 2022-01-27 ENCOUNTER — Ambulatory Visit (INDEPENDENT_AMBULATORY_CARE_PROVIDER_SITE_OTHER): Payer: Medicare Other | Admitting: Rehabilitative and Restorative Service Providers"

## 2022-01-27 ENCOUNTER — Encounter: Payer: Self-pay | Admitting: Rehabilitative and Restorative Service Providers"

## 2022-01-27 ENCOUNTER — Other Ambulatory Visit: Payer: Self-pay

## 2022-01-27 DIAGNOSIS — M5412 Radiculopathy, cervical region: Secondary | ICD-10-CM

## 2022-01-27 DIAGNOSIS — M6281 Muscle weakness (generalized): Secondary | ICD-10-CM | POA: Diagnosis not present

## 2022-01-27 DIAGNOSIS — R293 Abnormal posture: Secondary | ICD-10-CM

## 2022-01-27 NOTE — Therapy (Signed)
?OUTPATIENT PHYSICAL THERAPY TREATMENT NOTE ? ? ?Patient Name: Rick Turner ?MRN: [id number] ?DOB:1951-10-19, 71 y.o., male ?Today's Date: 01/27/2022 ? ?PCP: Rick Orn, MD ?REFERRING PROVIDER: Magnus Sinning, MD ? ? ? ? ? ? ? ? ? ? ? ? ?Past Medical History:  ?Diagnosis Date  ? Acne rosacea   ? Arthritis   ? BPH (benign prostatic hypertrophy)   ? Clavicle fracture   ? left  ? Detached retina   ? left   ? Detached retina, left 2011  ? Fx ankle   ? left  ? GERD (gastroesophageal reflux disease)   ? Hyperlipidemia   ? Hypertension   ? Hypothyroidism   ? OSA (obstructive sleep apnea)   ? mild  ? Seasonal allergies   ? Sleep apnea   ? Weakness of both lower extremities   ? ?Past Surgical History:  ?Procedure Laterality Date  ? CATARACT EXTRACTION  08/2011  ? left, right 2016  ? detached retiina  2011  ? left, 2014  ? EYE SURGERY    ? lasix  ? HERNIA REPAIR Right   ? ing  ? TONSILLECTOMY    ? TOTAL SHOULDER REPLACEMENT Left 2021  ? ?Patient Active Problem List  ? Diagnosis Date Noted  ? Hyperlipidemia 04/10/2020  ? Rib fracture 02/09/2019  ? Macula-off rhegmatogenous retinal detachment 04/19/2016  ? Dry eye syndrome 08/20/2013  ? Nuclear sclerotic cataract 08/20/2013  ? Pseudoaphakia 08/20/2013  ? Detached retina 08/20/2013  ? ? ?REFERRING DIAG: M54.12 (ICD-10-CM) - Cervical radiculopathy M54.2 (ICD-10-CM) - Cervicalgia M79.18 (ICD-10-CM) - Myofascial pain syndrome  ?  ?THERAPY DIAG:  ?Abnormal posture ?  ?Muscle weakness (generalized) ?  ?Radiculopathy, cervical region ?  ?ONSET DATE: Chronic (~ 6 months) ?  ? ?Precautions: cervical ? ?PERTINENT HISTORY:  ?Arthritis, L clavicle fracture, L ankle fracture, hyperlipidemia, HTN, hypothyroid, previous TSA ? ?PRECAUTIONS: Cervical (postural) ? ?SUBJECTIVE:  ?Rick Turner notes neck pain remains "0-1/10" since his last PT visit.  R upper arm pain has been better at night excluding certain positions.  Cervical AROM and neck pain are significantly improved.  Pain is mostly elbow  to shoulder and anterior deltoid and no more scapular pain.  Nothing below the elbow and no tingling or numbness. ?  ?PAIN:  ?Are you having pain? Yes ?NPRS scale: 0-4/10 ?Pain location: R anterior shoulder and lateral arm ?Pain orientation: Right  ?PAIN TYPE: aching ?Pain description: constant ?Aggravating factors: Sleeping on the R side, reaching out and overhead ?Relieving factors: activity, exercises ? ?AROM: ?  01/27/2022 ?  R shoulder: ?  Flexion  175 degrees ?  IR   55 degrees ?  ER  90 degrees ?  Horizontal adduction 40 degrees ? ? ?01/20/2022 ?Cervical rotation (L/R in degrees):   65/65 ?Cervical lateral bending (L/R in degrees):  30/30 ?Cervical extension (in degrees):   80 ? ? ?12/31/2021 ?Cervical rotation (L/R in degrees):   60/65 ?Cervical lateral bending (L/R in degrees):  25/25 ?Cervical extension (in degrees):   70 ? ?Shoulder flexion (L/R in degrees):   170/170 ?Shoulder ER (L/R in degrees):  65/75 ?Shoulder IR (L/R in degrees):   75/30 ?Horizontal adduction (L/R in degrees):  45/40 ? ? ?   12/22/2021:  ?          Cervical rotation: right: 70 degrees, left 72 degrees ?          Cervical side bending: right: 30 degrees, left 30 degrees ?  ? 12/17/2021: ?Cervical extension: 70 ? Cervical lateral bending (  L/R): 25/30 ? Cervical rotation (L/R): 70/55 ? ? ?Strength: ?01/27/2022 (in pounds with hand held dynamometer) ?R shoulder ER: 27.8 pounds (was 15.9) ?R shoulder IR: 37.7 pounds (was 28.0) ? ? ?01/20/2022 (in pounds with hand held dynamometer) ?R shoulder ER: 22.9 pounds (was 15.9) ?R shoulder IR: 37.6 pounds (was 28.0) ? ? ?12/31/2021 (in pounds with hand held dynamometer) ? ?Cervical extension -    36.3 pounds ?Cervical lateral bending (L/R) -  21.4/36.2 ?Shoulder ER (L/R) -    31.4/15.9 ?Shoulder IR (L/R) -   34.1/28.0 ? ? ? ? 12/17/2021 (was 12/03/2021) ?Cervical strength in pounds ?33.3 pounds (Was 39.2 pounds) extension ?15.9 L & 23.7 R (Was 23.0 (L)/16.5 (R) pounds) lateral bending  ? ?A/PROM AROM  (deg) ?12/03/2021  ?Flexion    ?Extension 70  ?Right lateral flexion 30  ?Left lateral flexion 20  ?Right rotation 45  ?Left rotation 35  ? (Blank rows = not tested) ? ? ?TODAY'S TREATMENT:  ?01/27/2022: ?Therapeutic Exercise: ? Aerobic: ? ?Supine: Supine IR stretch (at 70 degrees abduction on 2 pillows) 30X 10 seconds ? ?Supine arm raises 20X 3 seconds 3# ? ?Prone:  ? ? Seated:   ? ? Standing:  ?  Thumb up the back stretch 10X 10 seconds ? ?  Posterior capsule stretch 10X 10 seconds ? ?Rows c scapular retraction holding 3 seconds Blue band 20X ? ?Shoulder ER c towel under elbow Green band 10X R, slow eccentrics, perfect posture (3 sets) ? ?Shoulder IR c towel under elbow Green band 10 R only, slow eccentrics, perfect posture  ? ?Shoulder blade pinches 10X 5 seconds ? ?Cervical Extension Isometrics 10X 5 seconds (HEP) ? ?Cervical lateral bending isometrics 10X (5 each side) 5 seconds (HEP) ? ?Body blade 3X 20 seconds IR/ER elbow by side; up/down elbow by side; punch out ? ? ?01/20/2022: ?Therapeutic Exercise: ? Aerobic: ? ?Supine: Supine IR stretch (at 70 degrees abduction on 2 pillows) 30X 10 seconds ? ?Supine arm raises 20X 3 seconds ? ?Prone:  ? ? Seated:   ? ? Standing:  ?  Thumb up the back stretch 10X 10 seconds ? ?  Posterior capsule stretch 10X 10 seconds ? ?Rows c scapular retraction holding 3 seconds Blue band 20X ? ?Shoulder ER c towel under elbow Green band 10X R, slow eccentrics, perfect posture (3 sets) ? ?Shoulder IR c towel under elbow Green band 10 R only, slow eccentrics, perfect posture  ? ?Shoulder blade pinches 10X 5 seconds ? ?  Cervical Extension Isometrics 10X 5 seconds ? ?Cervical lateral bending isometrics 10X (5 each side) 5 seconds (next visit) ? ?Body blade 3X 20 seconds IR/ER elbow by side; up/down elbow by side; punch out ? ? ?01/13/2022: ?Therapeutic Exercise: ? Aerobic: ? ?Supine: Supine IR stretch (at 70 degrees abduction on 2 pillows) 30X 10 seconds ? ?Prone:  ? ? Seated:    ? ? Standing:  ?  Thumb up the back stretch 10X 10 seconds ? ?  Posterior capsule stretch 10X 10 seconds ? ?Rows c scapular retraction holding 3 seconds Blue band 20X (next visit) ? ?Shoulder ER c towel under elbow Red band 10X R, slow eccentrics, perfect posture (3 sets) ? ?Shoulder IR c towel under elbow Green band 10 R only, slow eccentrics, perfect posture  ? ?Shoulder blade pinches 10X 5 seconds ? ?  Cervical Extension Isometrics 10X 5 seconds ? ?Cervical lateral bending isometrics 10X (5 each side) 5 seconds ? ? ? ?PATIENT EDUCATION:  ? ?12/31/2021:  Reviewed reassessment findings and updated HEP.  Discussed (did not perform) select shoulder activities.  Discussed anatomy and mechanism of shoulder impingement. ?  ?12/24/2021: Long discussion regarding sleeping posture and cervical anatomy while on traction (see subjective). ? ?12/22/2021: Discussed changing pillows at night for neck support as well as a body pillow between arms and legs for spinal support ? ?  ?HOME EXERCISE PROGRAM: ?Access Code: [email or web address] ?URL: [email or web address] ?Date: 01/20/2022 ?Prepared by: Vista Mink ? ?Exercises ?Standing Scapular Retraction - 3-5 x daily - 7 x weekly - 1 sets - 5 reps - 5 second hold ?Standing Isometric Cervical Extension with Manual Resistance - 3-5 x daily - 7 x weekly - 1 sets - 5 reps - 5 hold ?Standing Isometric Cervical Sidebending with Manual Resistance - 2-3 x daily - 7 x weekly - 1 sets - 10 reps - 5 hold ?Seated Upper Trapezius Stretch - 2 x daily - 1 x weekly - 1 sets - 5 reps - 15 hold ?Standing Shoulder Row with Anchored Resistance - 1 x daily - 7 x weekly - 1 sets - 20 reps ?Shoulder Extension with Resistance - 1 x daily - 1 x weekly - 2 sets - 10 reps ?Shoulder External Rotation with Anchored Resistance (Mirrored) - 1 x daily - 7 x weekly - 3-4 sets - 10 reps ?Standing Shoulder Internal Rotation Stretch with Hands Behind Back - 2-3 x daily - 7 x weekly - 1 sets - 10 reps - 10 seconds  hold ?Standing Shoulder Posterior Capsule Stretch - 2-3 x daily - 7 x weekly - 1 sets - 10 reps - 10 hold ?Supine Shoulder Internal Rotation Stretch - 2 x daily - 7 x weekly - 1 sets - 10-20 reps - 10 seconds hold ?[place]

## 2022-01-28 ENCOUNTER — Encounter: Payer: Medicare Other | Admitting: Rehabilitative and Restorative Service Providers"

## 2022-02-03 ENCOUNTER — Encounter: Payer: Self-pay | Admitting: Rehabilitative and Restorative Service Providers"

## 2022-02-03 ENCOUNTER — Ambulatory Visit (INDEPENDENT_AMBULATORY_CARE_PROVIDER_SITE_OTHER): Payer: Medicare Other | Admitting: Rehabilitative and Restorative Service Providers"

## 2022-02-03 DIAGNOSIS — M6281 Muscle weakness (generalized): Secondary | ICD-10-CM

## 2022-02-03 DIAGNOSIS — M5412 Radiculopathy, cervical region: Secondary | ICD-10-CM | POA: Diagnosis not present

## 2022-02-03 DIAGNOSIS — R293 Abnormal posture: Secondary | ICD-10-CM

## 2022-02-03 NOTE — Therapy (Signed)
?OUTPATIENT PHYSICAL THERAPY TREATMENT NOTE ? ? ?Patient Name: Rick Turner ?MRN: [id number] ?DOB:September 26, 1951, 71 y.o., male ?Today's Date: 02/03/2022 ? ?PCP: Rick Orn, MD ?REFERRING PROVIDER: Magnus Sinning, MD ? ?PHYSICAL THERAPY DISCHARGE SUMMARY ? ?Visits from Start of Care: 13 ? ?Current functional level related to goals / functional outcomes: ?See note ?  ?Remaining deficits: ?Minimal, see note ?  ?Education / Equipment: ?Updated HEP  ? ?Patient agrees to discharge. Patient goals were met. Patient is being discharged due to being pleased with the current functional level. ?  ? PT End of Session - 02/03/22 2505   ? ? Visit Number 13   ? Number of Visits 16   ? Date for PT Re-Evaluation 01/28/22   ? Authorization Type Medicare   ? Progress Note Due on Visit 10   ? PT Start Time [phone]   ? PT Stop Time 0845   ? PT Time Calculation (min) 42 min   ? Activity Tolerance Patient tolerated treatment well;No increased pain   ? Behavior During Therapy [facility] for tasks assessed/performed   ? ?  ?  ? ?  ? ? ? ? ? ? ? ? ? ? ? ?Past Medical History:  ?Diagnosis Date  ? Acne rosacea   ? Arthritis   ? BPH (benign prostatic hypertrophy)   ? Clavicle fracture   ? left  ? Detached retina   ? left   ? Detached retina, left 2011  ? Fx ankle   ? left  ? GERD (gastroesophageal reflux disease)   ? Hyperlipidemia   ? Hypertension   ? Hypothyroidism   ? OSA (obstructive sleep apnea)   ? mild  ? Seasonal allergies   ? Sleep apnea   ? Weakness of both lower extremities   ? ?Past Surgical History:  ?Procedure Laterality Date  ? CATARACT EXTRACTION  08/2011  ? left, right 2016  ? detached retiina  2011  ? left, 2014  ? EYE SURGERY    ? lasix  ? HERNIA REPAIR Right   ? ing  ? TONSILLECTOMY    ? TOTAL SHOULDER REPLACEMENT Left 2021  ? ?Patient Active Problem List  ? Diagnosis Date Noted  ? Hyperlipidemia 04/10/2020  ? Rib fracture 02/09/2019  ? Macula-off rhegmatogenous retinal detachment 04/19/2016  ? Dry eye syndrome 08/20/2013  ?  Nuclear sclerotic cataract 08/20/2013  ? Pseudoaphakia 08/20/2013  ? Detached retina 08/20/2013  ? ? ?REFERRING DIAG: M54.12 (ICD-10-CM) - Cervical radiculopathy M54.2 (ICD-10-CM) - Cervicalgia M79.18 (ICD-10-CM) - Myofascial pain syndrome  ?  ?THERAPY DIAG:  ?Abnormal posture ?  ?Muscle weakness (generalized) ?  ?Radiculopathy, cervical region ?  ?ONSET DATE: Chronic (~ 6 months) ?  ? ?Precautions: cervical ? ?PERTINENT HISTORY:  ?Arthritis, L clavicle fracture, L ankle fracture, hyperlipidemia, HTN, hypothyroid, previous TSA ? ?PRECAUTIONS: Cervical (postural) ? ?SUBJECTIVE:  ?Rick Turner notes neck pain remains "0-1/10" over the past few weeks.  R upper arm pain has been significantly improved with only some weather-related pain over the past week.  Rick Turner has demonstrated independence and compliance with his updated HEP and reports he is ready to transfer into independent PT. ?  ?PAIN:  ?Are you having pain? Yes ?NPRS scale: 0-1/10 ?Pain location: R anterior shoulder and lateral arm ?Pain orientation: Right  ?PAIN TYPE: aching ?Pain description: constant ?Aggravating factors: Sleeping on the R side, reaching out and overhead ?Relieving factors: activity, exercises ? ?AROM: ?  02/03/2022 ?R shoulder: ?  Flexion  175 degrees ?  IR   60  degrees ?  ER  90 degrees ?  Horizontal adduction 40 degrees ? ? ?01/27/2022 ?  R shoulder: ?  Flexion  175 degrees ?  IR   55 degrees ?  ER  90 degrees ?  Horizontal adduction 40 degrees ? ?   ?  02/03/2022 ?Cervical rotation (L/R in degrees):   65/65 ?Cervical lateral bending (L/R in degrees):  30/30 ?Cervical extension (in degrees):   80 ? ? ?01/20/2022 ?Cervical rotation (L/R in degrees):   65/65 ?Cervical lateral bending (L/R in degrees):  30/30 ?Cervical extension (in degrees):   80 ? ? ?12/31/2021 ?Cervical rotation (L/R in degrees):   60/65 ?Cervical lateral bending (L/R in degrees):  25/25 ?Cervical extension (in degrees):   70 ? ?Shoulder flexion (L/R in degrees):   170/170 ?Shoulder ER  (L/R in degrees):  65/75 ?Shoulder IR (L/R in degrees):   75/30 ?Horizontal adduction (L/R in degrees):  45/40 ? ? ?   12/22/2021:  ?          Cervical rotation: right: 70 degrees, left 72 degrees ?          Cervical side bending: right: 30 degrees, left 30 degrees ?  ? 12/17/2021: ?Cervical extension: 70 ? Cervical lateral bending (L/R): 25/30 ? Cervical rotation (L/R): 70/55 ? ? ?Strength: ?02/03/2022 (in pounds with hand held dynamometer) ?R shoulder ER: 26.8 pounds (was 15.9) ?R shoulder IR: 42.2 pounds (was 28.0) ? ? ?01/27/2022 (in pounds with hand held dynamometer) ?R shoulder ER: 27.8 pounds (was 15.9) ?R shoulder IR: 37.7 pounds (was 28.0) ? ? ?01/20/2022 (in pounds with hand held dynamometer) ?R shoulder ER: 22.9 pounds (was 15.9) ?R shoulder IR: 37.6 pounds (was 28.0) ? ? ?02/03/2022 (in pounds with hand held dynamometer) ? ?Cervical extension -    48.8 pounds ?Cervical lateral bending (L/R) -  37.0/37.8 ? ? ?12/31/2021 (in pounds with hand held dynamometer) ? ?Cervical extension -    36.3 pounds ?Cervical lateral bending (L/R) -  21.4/36.2 ?Shoulder ER (L/R) -    31.4/15.9 ?Shoulder IR (L/R) -   34.1/28.0 ? ? ? ? 12/17/2021 (was 12/03/2021) ?Cervical strength in pounds ?33.3 pounds (Was 39.2 pounds) extension ?15.9 L & 23.7 R (Was 23.0 (L)/16.5 (R) pounds) lateral bending  ? ?A/PROM AROM (deg) ?12/03/2021 AROM (deg) 02/03/2022  ?Flexion     ?Extension 70 75  ?Right lateral flexion 30 30  ?Left lateral flexion 20 25  ?Right rotation 45 65  ?Left rotation 35 60  ? (Blank rows = not tested) ? ? ?TODAY'S TREATMENT:  ?02/03/2022: ?Therapeutic Exercise: ?Supine: Supine IR stretch (at 70 degrees abduction on 2 pillows) 20X 10 seconds ? ?Supine arm raises 20X 3 seconds 3# ? ? ? Standing:  ?  Thumb up the back stretch 10X 10 seconds ? ?  Posterior capsule stretch 10X 10 seconds ? ?Rows c scapular retraction holding 3 seconds Blue band 20X ? ?Shoulder ER c towel under elbow Green band 10X R, slow eccentrics, perfect posture  (3 sets) ? ?Shoulder IR c towel under elbow Green band 10 R only, slow eccentrics, perfect posture  ? ?Shoulder blade pinches 5X 5 seconds ? ?Cervical Extension Isometrics 5X 5 seconds (HEP) ? ?Cervical lateral bending isometrics 5X (5 each side) 5 seconds (HEP) ? ? ?01/27/2022: ?Therapeutic Exercise: ? Aerobic: ? ?Supine: Supine IR stretch (at 70 degrees abduction on 2 pillows) 30X 10 seconds ? ?Supine arm raises 20X 3 seconds 3# ? ?Prone:  ? ? Seated:   ? ? Standing:  ?  Thumb up the back stretch 10X 10 seconds ? ?  Posterior capsule stretch 10X 10 seconds ? ?Rows c scapular retraction holding 3 seconds Blue band 20X ? ?Shoulder ER c towel under elbow Green band 10X R, slow eccentrics, perfect posture (3 sets) ? ?Shoulder IR c towel under elbow Green band 10 R only, slow eccentrics, perfect posture  ? ?Shoulder blade pinches 10X 5 seconds ? ?Cervical Extension Isometrics 10X 5 seconds (HEP) ? ?Cervical lateral bending isometrics 10X (5 each side) 5 seconds (HEP) ? ?Body blade 3X 20 seconds IR/ER elbow by side; up/down elbow by side; punch out ? ? ?01/20/2022: ?Therapeutic Exercise: ? Aerobic: ? ?Supine: Supine IR stretch (at 70 degrees abduction on 2 pillows) 30X 10 seconds ? ?Supine arm raises 20X 3 seconds ? ?Prone:  ? ? Seated:   ? ? Standing:  ?  Thumb up the back stretch 10X 10 seconds ? ?  Posterior capsule stretch 10X 10 seconds ? ?Rows c scapular retraction holding 3 seconds Blue band 20X ? ?Shoulder ER c towel under elbow Green band 10X R, slow eccentrics, perfect posture (3 sets) ? ?Shoulder IR c towel under elbow Green band 10 R only, slow eccentrics, perfect posture  ? ?Shoulder blade pinches 10X 5 seconds ? ?  Cervical Extension Isometrics 10X 5 seconds ? ?Cervical lateral bending isometrics 10X (5 each side) 5 seconds (next visit) ? ?Body blade 3X 20 seconds IR/ER elbow by side; up/down elbow by side; punch out ? ? ?PATIENT EDUCATION:  ?02/03/2022: Reviewed reassessment and updated final, long-term  HEP. ? ?12/31/2021: Reviewed reassessment findings and updated HEP.  Discussed (did not perform) select shoulder activities.  Discussed anatomy and mechanism of shoulder impingement. ?  ?12/24/2021: Long discussio

## 2022-02-04 ENCOUNTER — Encounter: Payer: Medicare Other | Admitting: Rehabilitative and Restorative Service Providers"

## 2022-02-04 DIAGNOSIS — I10 Essential (primary) hypertension: Secondary | ICD-10-CM | POA: Diagnosis not present

## 2022-02-09 DIAGNOSIS — R350 Frequency of micturition: Secondary | ICD-10-CM | POA: Diagnosis not present

## 2022-02-09 DIAGNOSIS — M6281 Muscle weakness (generalized): Secondary | ICD-10-CM | POA: Diagnosis not present

## 2022-02-09 DIAGNOSIS — M25551 Pain in right hip: Secondary | ICD-10-CM | POA: Diagnosis not present

## 2022-02-24 DIAGNOSIS — Z20822 Contact with and (suspected) exposure to covid-19: Secondary | ICD-10-CM | POA: Diagnosis not present

## 2022-02-25 DIAGNOSIS — M6281 Muscle weakness (generalized): Secondary | ICD-10-CM | POA: Diagnosis not present

## 2022-02-25 DIAGNOSIS — R350 Frequency of micturition: Secondary | ICD-10-CM | POA: Diagnosis not present

## 2022-02-25 DIAGNOSIS — M25551 Pain in right hip: Secondary | ICD-10-CM | POA: Diagnosis not present

## 2022-02-25 DIAGNOSIS — R300 Dysuria: Secondary | ICD-10-CM | POA: Diagnosis not present

## 2022-03-02 DIAGNOSIS — R29898 Other symptoms and signs involving the musculoskeletal system: Secondary | ICD-10-CM | POA: Diagnosis not present

## 2022-03-02 DIAGNOSIS — I2510 Atherosclerotic heart disease of native coronary artery without angina pectoris: Secondary | ICD-10-CM | POA: Diagnosis not present

## 2022-03-02 DIAGNOSIS — M1712 Unilateral primary osteoarthritis, left knee: Secondary | ICD-10-CM | POA: Diagnosis not present

## 2022-03-02 DIAGNOSIS — D72819 Decreased white blood cell count, unspecified: Secondary | ICD-10-CM | POA: Diagnosis not present

## 2022-03-02 DIAGNOSIS — I10 Essential (primary) hypertension: Secondary | ICD-10-CM | POA: Diagnosis not present

## 2022-03-02 DIAGNOSIS — E7800 Pure hypercholesterolemia, unspecified: Secondary | ICD-10-CM | POA: Diagnosis not present

## 2022-03-02 DIAGNOSIS — M19011 Primary osteoarthritis, right shoulder: Secondary | ICD-10-CM | POA: Diagnosis not present

## 2022-03-06 DIAGNOSIS — I10 Essential (primary) hypertension: Secondary | ICD-10-CM | POA: Diagnosis not present

## 2022-03-07 ENCOUNTER — Ambulatory Visit (INDEPENDENT_AMBULATORY_CARE_PROVIDER_SITE_OTHER): Payer: Medicare Other | Admitting: Orthopedic Surgery

## 2022-03-07 ENCOUNTER — Encounter: Payer: Self-pay | Admitting: Orthopedic Surgery

## 2022-03-07 ENCOUNTER — Telehealth: Payer: Self-pay

## 2022-03-07 DIAGNOSIS — M1712 Unilateral primary osteoarthritis, left knee: Secondary | ICD-10-CM

## 2022-03-07 NOTE — Telephone Encounter (Signed)
Auth needed for left knee gel injection. Patient needs injection by 03/25/22 ?

## 2022-03-07 NOTE — Progress Notes (Signed)
Patient was not seen today.  Cortisone injections have not worked for him in the past.  He also deferred a Toradol injection.  We will plan to come back once he can get a gel injection in the affected knee prior to his trip to [place]. ?

## 2022-03-07 NOTE — Telephone Encounter (Signed)
VOB submitted for SynviscOne, left knee. ?BV pending.  ?Next available gel injection will need to be after 03/22/2022. ?

## 2022-03-07 NOTE — Telephone Encounter (Signed)
Submitted

## 2022-03-08 DIAGNOSIS — R972 Elevated prostate specific antigen [PSA]: Secondary | ICD-10-CM | POA: Diagnosis not present

## 2022-03-16 DIAGNOSIS — Z20822 Contact with and (suspected) exposure to covid-19: Secondary | ICD-10-CM | POA: Diagnosis not present

## 2022-03-17 ENCOUNTER — Other Ambulatory Visit: Payer: Self-pay

## 2022-03-17 DIAGNOSIS — M1712 Unilateral primary osteoarthritis, left knee: Secondary | ICD-10-CM

## 2022-03-18 ENCOUNTER — Ambulatory Visit: Payer: Medicare Other | Admitting: Orthopedic Surgery

## 2022-03-22 DIAGNOSIS — N3943 Post-void dribbling: Secondary | ICD-10-CM | POA: Diagnosis not present

## 2022-03-22 DIAGNOSIS — R3911 Hesitancy of micturition: Secondary | ICD-10-CM | POA: Diagnosis not present

## 2022-03-22 DIAGNOSIS — N5201 Erectile dysfunction due to arterial insufficiency: Secondary | ICD-10-CM | POA: Diagnosis not present

## 2022-03-22 DIAGNOSIS — R972 Elevated prostate specific antigen [PSA]: Secondary | ICD-10-CM | POA: Diagnosis not present

## 2022-03-23 DIAGNOSIS — M6281 Muscle weakness (generalized): Secondary | ICD-10-CM | POA: Diagnosis not present

## 2022-03-23 DIAGNOSIS — N3943 Post-void dribbling: Secondary | ICD-10-CM | POA: Diagnosis not present

## 2022-03-23 DIAGNOSIS — R102 Pelvic and perineal pain: Secondary | ICD-10-CM | POA: Diagnosis not present

## 2022-03-23 DIAGNOSIS — R3911 Hesitancy of micturition: Secondary | ICD-10-CM | POA: Diagnosis not present

## 2022-03-23 DIAGNOSIS — R300 Dysuria: Secondary | ICD-10-CM | POA: Diagnosis not present

## 2022-03-25 ENCOUNTER — Encounter: Payer: Self-pay | Admitting: Orthopedic Surgery

## 2022-03-25 ENCOUNTER — Ambulatory Visit (INDEPENDENT_AMBULATORY_CARE_PROVIDER_SITE_OTHER): Payer: Medicare Other | Admitting: Orthopedic Surgery

## 2022-03-25 VITALS — Ht 71.0 in | Wt 230.0 lb

## 2022-03-25 DIAGNOSIS — M1712 Unilateral primary osteoarthritis, left knee: Secondary | ICD-10-CM

## 2022-03-25 MED ORDER — HYLAN G-F 20 48 MG/6ML IX SOSY
48.0000 mg | PREFILLED_SYRINGE | INTRA_ARTICULAR | Status: AC | PRN
  Administered 2022-03-25: 48 mg via INTRA_ARTICULAR

## 2022-03-25 MED ORDER — LIDOCAINE HCL 1 % IJ SOLN
5.0000 mL | INTRAMUSCULAR | Status: AC | PRN
  Administered 2022-03-25: 5 mL

## 2022-03-25 NOTE — Progress Notes (Signed)
   Procedure Note  Patient: Rick Turner             Date of Birth: 1951/02/24           MRN: [id number]             Visit Date: 03/25/2022  Procedures: Visit Diagnoses:  1. Arthritis of left knee     Large Joint Inj: L knee on 03/25/2022 6:28 PM Indications: pain, joint swelling and diagnostic evaluation Details: 18 G 1.5 in needle, superolateral approach  Arthrogram: No  Medications: 5 mL lidocaine 1 %; 48 mg Hylan 48 MG/6ML Outcome: tolerated well, no immediate complications Procedure, treatment alternatives, risks and benefits explained, specific risks discussed. Consent was given by the patient. Immediately prior to procedure a time out was called to verify the correct patient, procedure, equipment, support staff and site/side marked as required. Patient was prepped and draped in the usual sterile fashion.   This patient is diagnosed with osteoarthritis of the knee(s).    Radiographs show evidence of joint space narrowing, osteophytes, subchondral sclerosis and/or subchondral cysts.  This patient has knee pain which interferes with functional and activities of daily living.    This patient has experienced inadequate response, adverse effects and/or intolerance with conservative treatments such as acetaminophen, NSAIDS, topical creams, physical therapy or regular exercise, knee bracing and/or weight loss.   This patient has experienced inadequate response or has a contraindication to intra articular steroid injections for at least 3 months.   This patient is not scheduled to have a total knee replacement within 6 months of starting treatment with viscosupplementation.

## 2022-03-28 DIAGNOSIS — R102 Pelvic and perineal pain: Secondary | ICD-10-CM | POA: Diagnosis not present

## 2022-03-28 DIAGNOSIS — R3911 Hesitancy of micturition: Secondary | ICD-10-CM | POA: Diagnosis not present

## 2022-03-28 DIAGNOSIS — N3943 Post-void dribbling: Secondary | ICD-10-CM | POA: Diagnosis not present

## 2022-03-28 DIAGNOSIS — M6281 Muscle weakness (generalized): Secondary | ICD-10-CM | POA: Diagnosis not present

## 2022-04-06 DIAGNOSIS — I10 Essential (primary) hypertension: Secondary | ICD-10-CM | POA: Diagnosis not present

## 2022-04-18 ENCOUNTER — Telehealth: Payer: Self-pay | Admitting: Physical Medicine and Rehabilitation

## 2022-04-18 NOTE — Telephone Encounter (Signed)
Patient called needing an appointment for an injection in his neck. The number to contact patient is [phone]

## 2022-04-21 ENCOUNTER — Encounter: Payer: Self-pay | Admitting: Physical Medicine and Rehabilitation

## 2022-04-27 DIAGNOSIS — M6289 Other specified disorders of muscle: Secondary | ICD-10-CM | POA: Diagnosis not present

## 2022-04-27 DIAGNOSIS — M6281 Muscle weakness (generalized): Secondary | ICD-10-CM | POA: Diagnosis not present

## 2022-04-27 DIAGNOSIS — N3943 Post-void dribbling: Secondary | ICD-10-CM | POA: Diagnosis not present

## 2022-04-27 DIAGNOSIS — R102 Pelvic and perineal pain: Secondary | ICD-10-CM | POA: Diagnosis not present

## 2022-05-03 DIAGNOSIS — M19011 Primary osteoarthritis, right shoulder: Secondary | ICD-10-CM | POA: Diagnosis not present

## 2022-05-03 DIAGNOSIS — M25511 Pain in right shoulder: Secondary | ICD-10-CM | POA: Diagnosis not present

## 2022-05-16 DIAGNOSIS — R3911 Hesitancy of micturition: Secondary | ICD-10-CM | POA: Diagnosis not present

## 2022-05-16 DIAGNOSIS — M6281 Muscle weakness (generalized): Secondary | ICD-10-CM | POA: Diagnosis not present

## 2022-05-16 DIAGNOSIS — M6289 Other specified disorders of muscle: Secondary | ICD-10-CM | POA: Diagnosis not present

## 2022-05-16 DIAGNOSIS — R102 Pelvic and perineal pain: Secondary | ICD-10-CM | POA: Diagnosis not present

## 2022-05-24 DIAGNOSIS — R350 Frequency of micturition: Secondary | ICD-10-CM | POA: Diagnosis not present

## 2022-05-24 DIAGNOSIS — R3911 Hesitancy of micturition: Secondary | ICD-10-CM | POA: Diagnosis not present

## 2022-05-24 DIAGNOSIS — M6289 Other specified disorders of muscle: Secondary | ICD-10-CM | POA: Diagnosis not present

## 2022-05-24 DIAGNOSIS — M6281 Muscle weakness (generalized): Secondary | ICD-10-CM | POA: Diagnosis not present

## 2022-05-24 DIAGNOSIS — M25551 Pain in right hip: Secondary | ICD-10-CM | POA: Diagnosis not present

## 2022-05-31 ENCOUNTER — Ambulatory Visit: Payer: Medicare Other

## 2022-05-31 ENCOUNTER — Encounter: Payer: Self-pay | Admitting: Physical Medicine and Rehabilitation

## 2022-05-31 ENCOUNTER — Ambulatory Visit (INDEPENDENT_AMBULATORY_CARE_PROVIDER_SITE_OTHER): Payer: Medicare Other | Admitting: Physical Medicine and Rehabilitation

## 2022-05-31 VITALS — BP 127/75 | HR 60

## 2022-05-31 DIAGNOSIS — M5412 Radiculopathy, cervical region: Secondary | ICD-10-CM

## 2022-05-31 DIAGNOSIS — M25551 Pain in right hip: Secondary | ICD-10-CM | POA: Diagnosis not present

## 2022-05-31 DIAGNOSIS — R102 Pelvic and perineal pain: Secondary | ICD-10-CM | POA: Diagnosis not present

## 2022-05-31 DIAGNOSIS — M6281 Muscle weakness (generalized): Secondary | ICD-10-CM | POA: Diagnosis not present

## 2022-05-31 DIAGNOSIS — N3943 Post-void dribbling: Secondary | ICD-10-CM | POA: Diagnosis not present

## 2022-05-31 DIAGNOSIS — M6289 Other specified disorders of muscle: Secondary | ICD-10-CM | POA: Diagnosis not present

## 2022-05-31 MED ORDER — METHYLPREDNISOLONE ACETATE 80 MG/ML IJ SUSP
80.0000 mg | Freq: Once | INTRAMUSCULAR | Status: AC
  Administered 2022-05-31: 80 mg

## 2022-05-31 NOTE — Patient Instructions (Signed)

## 2022-05-31 NOTE — Progress Notes (Signed)
Rick Turner - 71 y.o. male MRN [id number]  Date of birth: 12-18-1950  Office Visit Note: Visit Date: 05/31/2022 PCP: [name], MD Referred by: [name], MD  Subjective: Chief Complaint  Patient presents with   Neck - Pain   Right Arm - Pain   HPI:  Rick Turner is a 71 y.o. male who comes in today for planned repeat Right C7-T1  Cervical Interlaminar epidural steroid injection with fluoroscopic guidance.  The patient has failed conservative care including home exercise, medications, time and activity modification.  This injection will be diagnostic and hopefully therapeutic.  Please see requesting physician notes for further details and justification. Patient received more than 50% pain relief from prior injection.   Referring: Dr. Landry Dyke Dean   ROS Otherwise per HPI.  Assessment & Plan: Visit Diagnoses:    ICD-10-CM   1. Cervical radiculopathy  M54.12 XR C-ARM NO REPORT    Epidural Steroid injection    methylPREDNISolone acetate (DEPO-MEDROL) injection 80 mg      Plan: No additional findings.   Meds & Orders:  Meds ordered this encounter  Medications   methylPREDNISolone acetate (DEPO-MEDROL) injection 80 mg    Orders Placed This Encounter  Procedures   XR C-ARM NO REPORT   Epidural Steroid injection    Follow-up: Return for visit to requesting provider as needed.   Procedures: No procedures performed  Cervical Epidural Steroid Injection - Interlaminar Approach with Fluoroscopic Guidance  Patient: Rick Turner      Date of Birth: 11/01/1951 MRN: [id number] PCP: [name], MD      Visit Date: 05/31/2022   Universal Protocol:    Date/Time: 07/31/238:46 PM  Consent Given By: the patient  Position: PRONE  Additional Comments: Vital signs were monitored before and after the procedure. Patient was prepped and draped in the usual sterile fashion. The correct patient, procedure, and site was verified.   Injection Procedure  Details:   Procedure diagnoses: Cervical radiculopathy [M54.12]    Meds Administered:  Meds ordered this encounter  Medications   methylPREDNISolone acetate (DEPO-MEDROL) injection 80 mg     Laterality: Right  Location/Site: C7-T1  Needle: 3.5 in., 20 ga. Tuohy  Needle Placement: Paramedian epidural space  Findings:  -Comments: Excellent flow of contrast into the epidural space.  Procedure Details: Using a paramedian approach from the side mentioned above, the region overlying the inferior lamina was localized under fluoroscopic visualization and the soft tissues overlying this structure were infiltrated with 4 ml. of 1% Lidocaine without Epinephrine. A # 20 gauge, Tuohy needle was inserted into the epidural space using a paramedian approach.  The epidural space was localized using loss of resistance along with contralateral oblique bi-planar fluoroscopic views.  After negative aspirate for air, blood, and CSF, a 2 ml. volume of Isovue-250 was injected into the epidural space and the flow of contrast was observed. Radiographs were obtained for documentation purposes.   The injectate was administered into the level noted above.  Additional Comments:  The patient tolerated the procedure well Dressing: 2 x 2 sterile gauze and Band-Aid    Post-procedure details: Patient was observed during the procedure. Post-procedure instructions were reviewed.  Patient left the clinic in stable condition.   Clinical History: No specialty comments available.     Objective:  VS:  HT:    WT:   BMI:     BP:127/75  HR:60bpm  TEMP: ( )  RESP:  Physical Exam Vitals and nursing note reviewed.  Constitutional:  General: He is not in acute distress.    Appearance: Normal appearance. He is not ill-appearing.  HENT:     Head: Normocephalic and atraumatic.     Right Ear: External ear normal.     Left Ear: External ear normal.  Eyes:     Extraocular Movements: Extraocular movements  intact.  Cardiovascular:     Rate and Rhythm: Normal rate.     Pulses: Normal pulses.  Abdominal:     General: There is no distension.     Palpations: Abdomen is soft.  Musculoskeletal:        General: No signs of injury.     Cervical back: Neck supple. Tenderness present. No rigidity.     Right lower leg: No edema.     Left lower leg: No edema.     Comments: Patient has good strength in the upper extremities with 5 out of 5 strength in wrist extension long finger flexion APB.  No intrinsic hand muscle atrophy.  Negative Hoffmann's test.  Lymphadenopathy:     Cervical: No cervical adenopathy.  Skin:    Findings: No erythema or rash.  Neurological:     General: No focal deficit present.     Mental Status: He is alert and oriented to person, place, and time.     Sensory: No sensory deficit.     Motor: No weakness or abnormal muscle tone.     Coordination: Coordination normal.  Psychiatric:        Mood and Affect: Mood normal.        Behavior: Behavior normal.      Imaging: No results found.

## 2022-05-31 NOTE — Progress Notes (Signed)
Pt state neck pain that travels to his right arm. Pt state turning his head makes the pain worse. Pt state he takes over the counter pain meds and uses heat / ice to help ease his pain.  Numeric Pain Rating Scale and Functional Assessment Average Pain 5   In the last MONTH (on 0-10 scale) has pain interfered with the following?  1. General activity like being  able to carry out your everyday physical activities such as walking, climbing stairs, carrying groceries, or moving a chair?  Rating(8)   +Driver, -BT, -Dye Allergies.

## 2022-06-06 DIAGNOSIS — K219 Gastro-esophageal reflux disease without esophagitis: Secondary | ICD-10-CM | POA: Diagnosis not present

## 2022-06-06 DIAGNOSIS — I10 Essential (primary) hypertension: Secondary | ICD-10-CM | POA: Diagnosis not present

## 2022-06-06 DIAGNOSIS — E7800 Pure hypercholesterolemia, unspecified: Secondary | ICD-10-CM | POA: Diagnosis not present

## 2022-06-06 NOTE — Procedures (Signed)
Cervical Epidural Steroid Injection - Interlaminar Approach with Fluoroscopic Guidance  Patient: Rick Turner      Date of Birth: 08/09/1951 MRN: [id number] PCP: [name], MD      Visit Date: 05/31/2022   Universal Protocol:    Date/Time: 07/31/238:46 PM  Consent Given By: the patient  Position: PRONE  Additional Comments: Vital signs were monitored before and after the procedure. Patient was prepped and draped in the usual sterile fashion. The correct patient, procedure, and site was verified.   Injection Procedure Details:   Procedure diagnoses: Cervical radiculopathy [M54.12]    Meds Administered:  Meds ordered this encounter  Medications   methylPREDNISolone acetate (DEPO-MEDROL) injection 80 mg     Laterality: Right  Location/Site: C7-T1  Needle: 3.5 in., 20 ga. Tuohy  Needle Placement: Paramedian epidural space  Findings:  -Comments: Excellent flow of contrast into the epidural space.  Procedure Details: Using a paramedian approach from the side mentioned above, the region overlying the inferior lamina was localized under fluoroscopic visualization and the soft tissues overlying this structure were infiltrated with 4 ml. of 1% Lidocaine without Epinephrine. A # 20 gauge, Tuohy needle was inserted into the epidural space using a paramedian approach.  The epidural space was localized using loss of resistance along with contralateral oblique bi-planar fluoroscopic views.  After negative aspirate for air, blood, and CSF, a 2 ml. volume of Isovue-250 was injected into the epidural space and the flow of contrast was observed. Radiographs were obtained for documentation purposes.   The injectate was administered into the level noted above.  Additional Comments:  The patient tolerated the procedure well Dressing: 2 x 2 sterile gauze and Band-Aid    Post-procedure details: Patient was observed during the procedure. Post-procedure instructions were  reviewed.  Patient left the clinic in stable condition.

## 2022-06-07 DIAGNOSIS — N3943 Post-void dribbling: Secondary | ICD-10-CM | POA: Diagnosis not present

## 2022-06-07 DIAGNOSIS — M6281 Muscle weakness (generalized): Secondary | ICD-10-CM | POA: Diagnosis not present

## 2022-06-07 DIAGNOSIS — M25551 Pain in right hip: Secondary | ICD-10-CM | POA: Diagnosis not present

## 2022-06-07 DIAGNOSIS — M6289 Other specified disorders of muscle: Secondary | ICD-10-CM | POA: Diagnosis not present

## 2022-06-07 DIAGNOSIS — R102 Pelvic and perineal pain: Secondary | ICD-10-CM | POA: Diagnosis not present

## 2022-06-17 DIAGNOSIS — M1712 Unilateral primary osteoarthritis, left knee: Secondary | ICD-10-CM | POA: Diagnosis not present

## 2022-06-17 DIAGNOSIS — I2510 Atherosclerotic heart disease of native coronary artery without angina pectoris: Secondary | ICD-10-CM | POA: Diagnosis not present

## 2022-06-17 DIAGNOSIS — R29898 Other symptoms and signs involving the musculoskeletal system: Secondary | ICD-10-CM | POA: Diagnosis not present

## 2022-06-17 DIAGNOSIS — D649 Anemia, unspecified: Secondary | ICD-10-CM | POA: Diagnosis not present

## 2022-06-17 DIAGNOSIS — M25511 Pain in right shoulder: Secondary | ICD-10-CM | POA: Diagnosis not present

## 2022-06-17 DIAGNOSIS — E7800 Pure hypercholesterolemia, unspecified: Secondary | ICD-10-CM | POA: Diagnosis not present

## 2022-06-17 DIAGNOSIS — M19011 Primary osteoarthritis, right shoulder: Secondary | ICD-10-CM | POA: Diagnosis not present

## 2022-06-17 DIAGNOSIS — I10 Essential (primary) hypertension: Secondary | ICD-10-CM | POA: Diagnosis not present

## 2022-06-22 DIAGNOSIS — R102 Pelvic and perineal pain: Secondary | ICD-10-CM | POA: Diagnosis not present

## 2022-06-22 DIAGNOSIS — N3943 Post-void dribbling: Secondary | ICD-10-CM | POA: Diagnosis not present

## 2022-06-22 DIAGNOSIS — M6289 Other specified disorders of muscle: Secondary | ICD-10-CM | POA: Diagnosis not present

## 2022-06-22 DIAGNOSIS — M25551 Pain in right hip: Secondary | ICD-10-CM | POA: Diagnosis not present

## 2022-06-22 DIAGNOSIS — M6281 Muscle weakness (generalized): Secondary | ICD-10-CM | POA: Diagnosis not present

## 2022-06-22 DIAGNOSIS — R350 Frequency of micturition: Secondary | ICD-10-CM | POA: Diagnosis not present

## 2022-06-27 DIAGNOSIS — M62838 Other muscle spasm: Secondary | ICD-10-CM | POA: Diagnosis not present

## 2022-06-27 DIAGNOSIS — M25551 Pain in right hip: Secondary | ICD-10-CM | POA: Diagnosis not present

## 2022-06-27 DIAGNOSIS — R102 Pelvic and perineal pain: Secondary | ICD-10-CM | POA: Diagnosis not present

## 2022-06-27 DIAGNOSIS — M6281 Muscle weakness (generalized): Secondary | ICD-10-CM | POA: Diagnosis not present

## 2022-07-05 DIAGNOSIS — M6281 Muscle weakness (generalized): Secondary | ICD-10-CM | POA: Diagnosis not present

## 2022-07-05 DIAGNOSIS — R102 Pelvic and perineal pain: Secondary | ICD-10-CM | POA: Diagnosis not present

## 2022-07-05 DIAGNOSIS — M6289 Other specified disorders of muscle: Secondary | ICD-10-CM | POA: Diagnosis not present

## 2022-07-05 DIAGNOSIS — N3943 Post-void dribbling: Secondary | ICD-10-CM | POA: Diagnosis not present

## 2022-07-05 DIAGNOSIS — M62838 Other muscle spasm: Secondary | ICD-10-CM | POA: Diagnosis not present

## 2022-07-12 DIAGNOSIS — M62838 Other muscle spasm: Secondary | ICD-10-CM | POA: Diagnosis not present

## 2022-07-12 DIAGNOSIS — M6281 Muscle weakness (generalized): Secondary | ICD-10-CM | POA: Diagnosis not present

## 2022-07-12 DIAGNOSIS — M25551 Pain in right hip: Secondary | ICD-10-CM | POA: Diagnosis not present

## 2022-07-12 DIAGNOSIS — R102 Pelvic and perineal pain: Secondary | ICD-10-CM | POA: Diagnosis not present

## 2022-07-28 DIAGNOSIS — Z23 Encounter for immunization: Secondary | ICD-10-CM | POA: Diagnosis not present

## 2022-07-29 DIAGNOSIS — R102 Pelvic and perineal pain: Secondary | ICD-10-CM | POA: Diagnosis not present

## 2022-07-29 DIAGNOSIS — M62838 Other muscle spasm: Secondary | ICD-10-CM | POA: Diagnosis not present

## 2022-07-29 DIAGNOSIS — M6281 Muscle weakness (generalized): Secondary | ICD-10-CM | POA: Diagnosis not present

## 2022-08-05 DIAGNOSIS — R102 Pelvic and perineal pain: Secondary | ICD-10-CM | POA: Diagnosis not present

## 2022-08-05 DIAGNOSIS — M25551 Pain in right hip: Secondary | ICD-10-CM | POA: Diagnosis not present

## 2022-08-05 DIAGNOSIS — M62838 Other muscle spasm: Secondary | ICD-10-CM | POA: Diagnosis not present

## 2022-08-05 DIAGNOSIS — M6281 Muscle weakness (generalized): Secondary | ICD-10-CM | POA: Diagnosis not present

## 2022-08-16 DIAGNOSIS — M6289 Other specified disorders of muscle: Secondary | ICD-10-CM | POA: Diagnosis not present

## 2022-08-16 DIAGNOSIS — M6281 Muscle weakness (generalized): Secondary | ICD-10-CM | POA: Diagnosis not present

## 2022-08-16 DIAGNOSIS — R102 Pelvic and perineal pain: Secondary | ICD-10-CM | POA: Diagnosis not present

## 2022-08-16 DIAGNOSIS — M62838 Other muscle spasm: Secondary | ICD-10-CM | POA: Diagnosis not present

## 2022-08-24 DIAGNOSIS — I10 Essential (primary) hypertension: Secondary | ICD-10-CM | POA: Diagnosis not present

## 2022-08-24 DIAGNOSIS — Z01818 Encounter for other preprocedural examination: Secondary | ICD-10-CM | POA: Diagnosis not present

## 2022-08-24 DIAGNOSIS — D649 Anemia, unspecified: Secondary | ICD-10-CM | POA: Diagnosis not present

## 2022-08-31 DIAGNOSIS — Z01818 Encounter for other preprocedural examination: Secondary | ICD-10-CM | POA: Diagnosis not present

## 2022-09-01 DIAGNOSIS — M62838 Other muscle spasm: Secondary | ICD-10-CM | POA: Diagnosis not present

## 2022-09-01 DIAGNOSIS — M6281 Muscle weakness (generalized): Secondary | ICD-10-CM | POA: Diagnosis not present

## 2022-09-01 DIAGNOSIS — M25551 Pain in right hip: Secondary | ICD-10-CM | POA: Diagnosis not present

## 2022-09-01 DIAGNOSIS — R102 Pelvic and perineal pain: Secondary | ICD-10-CM | POA: Diagnosis not present

## 2022-09-07 HISTORY — PX: TOTAL SHOULDER REPLACEMENT: SUR1217

## 2022-09-14 DIAGNOSIS — M19011 Primary osteoarthritis, right shoulder: Secondary | ICD-10-CM | POA: Diagnosis not present

## 2022-09-14 DIAGNOSIS — Z79890 Hormone replacement therapy (postmenopausal): Secondary | ICD-10-CM | POA: Diagnosis not present

## 2022-09-14 DIAGNOSIS — Z888 Allergy status to other drugs, medicaments and biological substances status: Secondary | ICD-10-CM | POA: Diagnosis not present

## 2022-09-14 DIAGNOSIS — E7800 Pure hypercholesterolemia, unspecified: Secondary | ICD-10-CM | POA: Diagnosis not present

## 2022-09-14 DIAGNOSIS — M7521 Bicipital tendinitis, right shoulder: Secondary | ICD-10-CM | POA: Diagnosis not present

## 2022-09-14 DIAGNOSIS — Z79899 Other long term (current) drug therapy: Secondary | ICD-10-CM | POA: Diagnosis not present

## 2022-09-14 DIAGNOSIS — Z823 Family history of stroke: Secondary | ICD-10-CM | POA: Diagnosis not present

## 2022-09-14 DIAGNOSIS — K219 Gastro-esophageal reflux disease without esophagitis: Secondary | ICD-10-CM | POA: Diagnosis not present

## 2022-09-14 DIAGNOSIS — Z809 Family history of malignant neoplasm, unspecified: Secondary | ICD-10-CM | POA: Diagnosis not present

## 2022-09-14 DIAGNOSIS — Z8261 Family history of arthritis: Secondary | ICD-10-CM | POA: Diagnosis not present

## 2022-09-14 DIAGNOSIS — Z8249 Family history of ischemic heart disease and other diseases of the circulatory system: Secondary | ICD-10-CM | POA: Diagnosis not present

## 2022-09-14 DIAGNOSIS — Z8616 Personal history of COVID-19: Secondary | ICD-10-CM | POA: Diagnosis not present

## 2022-09-14 DIAGNOSIS — Z833 Family history of diabetes mellitus: Secondary | ICD-10-CM | POA: Diagnosis not present

## 2022-09-14 DIAGNOSIS — N400 Enlarged prostate without lower urinary tract symptoms: Secondary | ICD-10-CM | POA: Diagnosis not present

## 2022-09-14 DIAGNOSIS — Z961 Presence of intraocular lens: Secondary | ICD-10-CM | POA: Diagnosis not present

## 2022-09-14 DIAGNOSIS — G8918 Other acute postprocedural pain: Secondary | ICD-10-CM | POA: Diagnosis not present

## 2022-09-14 DIAGNOSIS — Z96612 Presence of left artificial shoulder joint: Secondary | ICD-10-CM | POA: Diagnosis not present

## 2022-09-14 DIAGNOSIS — M25511 Pain in right shoulder: Secondary | ICD-10-CM | POA: Diagnosis not present

## 2022-09-14 DIAGNOSIS — M75121 Complete rotator cuff tear or rupture of right shoulder, not specified as traumatic: Secondary | ICD-10-CM | POA: Diagnosis not present

## 2022-09-14 DIAGNOSIS — E039 Hypothyroidism, unspecified: Secondary | ICD-10-CM | POA: Diagnosis not present

## 2022-09-14 DIAGNOSIS — G4730 Sleep apnea, unspecified: Secondary | ICD-10-CM | POA: Diagnosis not present

## 2022-09-14 DIAGNOSIS — D649 Anemia, unspecified: Secondary | ICD-10-CM | POA: Diagnosis not present

## 2022-09-14 DIAGNOSIS — Z9849 Cataract extraction status, unspecified eye: Secondary | ICD-10-CM | POA: Diagnosis not present

## 2022-09-14 DIAGNOSIS — I10 Essential (primary) hypertension: Secondary | ICD-10-CM | POA: Diagnosis not present

## 2022-09-14 DIAGNOSIS — Z7982 Long term (current) use of aspirin: Secondary | ICD-10-CM | POA: Diagnosis not present

## 2022-09-26 ENCOUNTER — Other Ambulatory Visit: Payer: Self-pay | Admitting: Orthopedic Surgery

## 2022-09-26 ENCOUNTER — Ambulatory Visit
Admission: RE | Admit: 2022-09-26 | Discharge: 2022-09-26 | Disposition: A | Discharge status: Home / Self Care | Payer: Medicare Other | Source: Ambulatory Visit | Attending: Orthopedic Surgery | Admitting: Orthopedic Surgery

## 2022-09-26 DIAGNOSIS — Z96611 Presence of right artificial shoulder joint: Secondary | ICD-10-CM | POA: Diagnosis not present

## 2022-09-26 DIAGNOSIS — Z471 Aftercare following joint replacement surgery: Secondary | ICD-10-CM | POA: Diagnosis not present

## 2022-09-27 DIAGNOSIS — M19011 Primary osteoarthritis, right shoulder: Secondary | ICD-10-CM | POA: Diagnosis not present

## 2022-10-04 DIAGNOSIS — Z96611 Presence of right artificial shoulder joint: Secondary | ICD-10-CM | POA: Diagnosis not present

## 2022-10-04 DIAGNOSIS — M6281 Muscle weakness (generalized): Secondary | ICD-10-CM | POA: Diagnosis not present

## 2022-10-04 DIAGNOSIS — M25611 Stiffness of right shoulder, not elsewhere classified: Secondary | ICD-10-CM | POA: Diagnosis not present

## 2022-10-05 DIAGNOSIS — M6281 Muscle weakness (generalized): Secondary | ICD-10-CM | POA: Diagnosis not present

## 2022-10-05 DIAGNOSIS — Z96611 Presence of right artificial shoulder joint: Secondary | ICD-10-CM | POA: Diagnosis not present

## 2022-10-05 DIAGNOSIS — M25611 Stiffness of right shoulder, not elsewhere classified: Secondary | ICD-10-CM | POA: Diagnosis not present

## 2022-10-10 DIAGNOSIS — M6281 Muscle weakness (generalized): Secondary | ICD-10-CM | POA: Diagnosis not present

## 2022-10-10 DIAGNOSIS — M25611 Stiffness of right shoulder, not elsewhere classified: Secondary | ICD-10-CM | POA: Diagnosis not present

## 2022-10-10 DIAGNOSIS — Z96611 Presence of right artificial shoulder joint: Secondary | ICD-10-CM | POA: Diagnosis not present

## 2022-10-12 DIAGNOSIS — M25611 Stiffness of right shoulder, not elsewhere classified: Secondary | ICD-10-CM | POA: Diagnosis not present

## 2022-10-12 DIAGNOSIS — M6281 Muscle weakness (generalized): Secondary | ICD-10-CM | POA: Diagnosis not present

## 2022-10-12 DIAGNOSIS — Z96611 Presence of right artificial shoulder joint: Secondary | ICD-10-CM | POA: Diagnosis not present

## 2022-10-18 DIAGNOSIS — M6281 Muscle weakness (generalized): Secondary | ICD-10-CM | POA: Diagnosis not present

## 2022-10-18 DIAGNOSIS — M25611 Stiffness of right shoulder, not elsewhere classified: Secondary | ICD-10-CM | POA: Diagnosis not present

## 2022-10-18 DIAGNOSIS — R972 Elevated prostate specific antigen [PSA]: Secondary | ICD-10-CM | POA: Diagnosis not present

## 2022-10-18 DIAGNOSIS — Z96611 Presence of right artificial shoulder joint: Secondary | ICD-10-CM | POA: Diagnosis not present

## 2022-10-19 ENCOUNTER — Telehealth: Payer: Self-pay | Admitting: Orthopedic Surgery

## 2022-10-19 NOTE — Telephone Encounter (Signed)
Pt called in requesting to schedule an appt for gel injection... Pt requesting callback.Rick Turner

## 2022-10-19 NOTE — Telephone Encounter (Signed)
Talked with patient and appointment has been scheduled.

## 2022-10-19 NOTE — Telephone Encounter (Signed)
VOB submitted for SynvisOne, left knee.

## 2022-10-21 ENCOUNTER — Ambulatory Visit (INDEPENDENT_AMBULATORY_CARE_PROVIDER_SITE_OTHER): Payer: Medicare Other | Admitting: Surgical

## 2022-10-21 ENCOUNTER — Other Ambulatory Visit: Payer: Self-pay

## 2022-10-21 ENCOUNTER — Encounter: Payer: Self-pay | Admitting: Surgical

## 2022-10-21 DIAGNOSIS — M1712 Unilateral primary osteoarthritis, left knee: Secondary | ICD-10-CM | POA: Diagnosis not present

## 2022-10-21 MED ORDER — LIDOCAINE HCL 1 % IJ SOLN
5.0000 mL | INTRAMUSCULAR | Status: AC | PRN
  Administered 2022-10-21: 5 mL

## 2022-10-21 MED ORDER — HYLAN G-F 20 48 MG/6ML IX SOSY
48.0000 mg | PREFILLED_SYRINGE | INTRA_ARTICULAR | Status: AC | PRN
  Administered 2022-10-21: 48 mg via INTRA_ARTICULAR

## 2022-10-21 NOTE — Progress Notes (Signed)
   Procedure Note  Patient: Doug Bucklin             Date of Birth: Jun 05, 1951           MRN: [id number]             Visit Date: 10/21/2022  Procedures: Visit Diagnoses:  1. Arthritis of left knee     Large Joint Inj: L knee on 10/21/2022 1:27 PM Indications: pain, joint swelling and diagnostic evaluation Details: 18 G 1.5 in needle, superolateral approach  Arthrogram: No  Medications: 5 mL lidocaine 1 %; 48 mg Hylan G-F 20 48 MG/6ML Outcome: tolerated well, no immediate complications Procedure, treatment alternatives, risks and benefits explained, specific risks discussed. Consent was given by the patient. Immediately prior to procedure a time out was called to verify the correct patient, procedure, equipment, support staff and site/side marked as required. Patient was prepped and draped in the usual sterile fashion.

## 2022-10-24 DIAGNOSIS — H5212 Myopia, left eye: Secondary | ICD-10-CM | POA: Diagnosis not present

## 2022-10-24 DIAGNOSIS — H52223 Regular astigmatism, bilateral: Secondary | ICD-10-CM | POA: Diagnosis not present

## 2022-10-24 DIAGNOSIS — Z961 Presence of intraocular lens: Secondary | ICD-10-CM | POA: Diagnosis not present

## 2022-10-24 DIAGNOSIS — H53143 Visual discomfort, bilateral: Secondary | ICD-10-CM | POA: Diagnosis not present

## 2022-10-24 DIAGNOSIS — Z96611 Presence of right artificial shoulder joint: Secondary | ICD-10-CM | POA: Diagnosis not present

## 2022-10-24 DIAGNOSIS — Z9849 Cataract extraction status, unspecified eye: Secondary | ICD-10-CM | POA: Diagnosis not present

## 2022-10-24 DIAGNOSIS — M25611 Stiffness of right shoulder, not elsewhere classified: Secondary | ICD-10-CM | POA: Diagnosis not present

## 2022-10-24 DIAGNOSIS — H1711 Central corneal opacity, right eye: Secondary | ICD-10-CM | POA: Diagnosis not present

## 2022-10-24 DIAGNOSIS — M6281 Muscle weakness (generalized): Secondary | ICD-10-CM | POA: Diagnosis not present

## 2022-10-25 DIAGNOSIS — A630 Anogenital (venereal) warts: Secondary | ICD-10-CM | POA: Diagnosis not present

## 2022-10-25 DIAGNOSIS — N5201 Erectile dysfunction due to arterial insufficiency: Secondary | ICD-10-CM | POA: Diagnosis not present

## 2022-10-25 DIAGNOSIS — R972 Elevated prostate specific antigen [PSA]: Secondary | ICD-10-CM | POA: Diagnosis not present

## 2022-10-26 DIAGNOSIS — Z5181 Encounter for therapeutic drug level monitoring: Secondary | ICD-10-CM | POA: Diagnosis not present

## 2022-10-26 DIAGNOSIS — M25611 Stiffness of right shoulder, not elsewhere classified: Secondary | ICD-10-CM | POA: Diagnosis not present

## 2022-10-26 DIAGNOSIS — Z96611 Presence of right artificial shoulder joint: Secondary | ICD-10-CM | POA: Diagnosis not present

## 2022-10-26 DIAGNOSIS — M6281 Muscle weakness (generalized): Secondary | ICD-10-CM | POA: Diagnosis not present

## 2022-11-01 DIAGNOSIS — M25611 Stiffness of right shoulder, not elsewhere classified: Secondary | ICD-10-CM | POA: Diagnosis not present

## 2022-11-01 DIAGNOSIS — Z96611 Presence of right artificial shoulder joint: Secondary | ICD-10-CM | POA: Diagnosis not present

## 2022-11-01 DIAGNOSIS — M6281 Muscle weakness (generalized): Secondary | ICD-10-CM | POA: Diagnosis not present

## 2022-11-04 DIAGNOSIS — Z96611 Presence of right artificial shoulder joint: Secondary | ICD-10-CM | POA: Diagnosis not present

## 2022-11-04 DIAGNOSIS — M6281 Muscle weakness (generalized): Secondary | ICD-10-CM | POA: Diagnosis not present

## 2022-11-04 DIAGNOSIS — M25611 Stiffness of right shoulder, not elsewhere classified: Secondary | ICD-10-CM | POA: Diagnosis not present

## 2022-11-14 DIAGNOSIS — Z96611 Presence of right artificial shoulder joint: Secondary | ICD-10-CM | POA: Diagnosis not present

## 2022-11-14 DIAGNOSIS — M6281 Muscle weakness (generalized): Secondary | ICD-10-CM | POA: Diagnosis not present

## 2022-11-14 DIAGNOSIS — M25611 Stiffness of right shoulder, not elsewhere classified: Secondary | ICD-10-CM | POA: Diagnosis not present

## 2022-11-15 DIAGNOSIS — G5791 Unspecified mononeuropathy of right lower limb: Secondary | ICD-10-CM | POA: Diagnosis not present

## 2022-11-16 DIAGNOSIS — Z96611 Presence of right artificial shoulder joint: Secondary | ICD-10-CM | POA: Diagnosis not present

## 2022-11-16 DIAGNOSIS — M6281 Muscle weakness (generalized): Secondary | ICD-10-CM | POA: Diagnosis not present

## 2022-11-16 DIAGNOSIS — M25611 Stiffness of right shoulder, not elsewhere classified: Secondary | ICD-10-CM | POA: Diagnosis not present

## 2022-11-17 ENCOUNTER — Other Ambulatory Visit: Payer: Self-pay | Admitting: Orthopedic Surgery

## 2022-11-17 ENCOUNTER — Ambulatory Visit
Admission: RE | Admit: 2022-11-17 | Discharge: 2022-11-17 | Disposition: A | Discharge status: Home / Self Care | Payer: Medicare Other | Source: Ambulatory Visit | Attending: Orthopedic Surgery | Admitting: Orthopedic Surgery

## 2022-11-17 ENCOUNTER — Other Ambulatory Visit: Payer: Medicare Other

## 2022-11-17 DIAGNOSIS — Z96611 Presence of right artificial shoulder joint: Secondary | ICD-10-CM

## 2022-11-18 DIAGNOSIS — Z96611 Presence of right artificial shoulder joint: Secondary | ICD-10-CM | POA: Diagnosis not present

## 2022-11-22 DIAGNOSIS — Z96611 Presence of right artificial shoulder joint: Secondary | ICD-10-CM | POA: Diagnosis not present

## 2022-11-22 DIAGNOSIS — M6281 Muscle weakness (generalized): Secondary | ICD-10-CM | POA: Diagnosis not present

## 2022-11-22 DIAGNOSIS — M25611 Stiffness of right shoulder, not elsewhere classified: Secondary | ICD-10-CM | POA: Diagnosis not present

## 2022-11-23 DIAGNOSIS — G5791 Unspecified mononeuropathy of right lower limb: Secondary | ICD-10-CM | POA: Diagnosis not present

## 2022-11-24 DIAGNOSIS — M6281 Muscle weakness (generalized): Secondary | ICD-10-CM | POA: Diagnosis not present

## 2022-11-24 DIAGNOSIS — Z96611 Presence of right artificial shoulder joint: Secondary | ICD-10-CM | POA: Diagnosis not present

## 2022-11-24 DIAGNOSIS — M25611 Stiffness of right shoulder, not elsewhere classified: Secondary | ICD-10-CM | POA: Diagnosis not present

## 2022-11-29 DIAGNOSIS — Z96611 Presence of right artificial shoulder joint: Secondary | ICD-10-CM | POA: Diagnosis not present

## 2022-11-29 DIAGNOSIS — M6281 Muscle weakness (generalized): Secondary | ICD-10-CM | POA: Diagnosis not present

## 2022-11-29 DIAGNOSIS — M25611 Stiffness of right shoulder, not elsewhere classified: Secondary | ICD-10-CM | POA: Diagnosis not present

## 2022-12-01 DIAGNOSIS — M25611 Stiffness of right shoulder, not elsewhere classified: Secondary | ICD-10-CM | POA: Diagnosis not present

## 2022-12-01 DIAGNOSIS — M6281 Muscle weakness (generalized): Secondary | ICD-10-CM | POA: Diagnosis not present

## 2022-12-01 DIAGNOSIS — Z96611 Presence of right artificial shoulder joint: Secondary | ICD-10-CM | POA: Diagnosis not present

## 2022-12-06 DIAGNOSIS — M25611 Stiffness of right shoulder, not elsewhere classified: Secondary | ICD-10-CM | POA: Diagnosis not present

## 2022-12-06 DIAGNOSIS — Z96611 Presence of right artificial shoulder joint: Secondary | ICD-10-CM | POA: Diagnosis not present

## 2022-12-06 DIAGNOSIS — M6281 Muscle weakness (generalized): Secondary | ICD-10-CM | POA: Diagnosis not present

## 2022-12-09 DIAGNOSIS — M6281 Muscle weakness (generalized): Secondary | ICD-10-CM | POA: Diagnosis not present

## 2022-12-09 DIAGNOSIS — Z96611 Presence of right artificial shoulder joint: Secondary | ICD-10-CM | POA: Diagnosis not present

## 2022-12-09 DIAGNOSIS — M25611 Stiffness of right shoulder, not elsewhere classified: Secondary | ICD-10-CM | POA: Diagnosis not present

## 2022-12-13 DIAGNOSIS — Z96611 Presence of right artificial shoulder joint: Secondary | ICD-10-CM | POA: Diagnosis not present

## 2022-12-13 DIAGNOSIS — M6281 Muscle weakness (generalized): Secondary | ICD-10-CM | POA: Diagnosis not present

## 2022-12-13 DIAGNOSIS — M25611 Stiffness of right shoulder, not elsewhere classified: Secondary | ICD-10-CM | POA: Diagnosis not present

## 2022-12-16 DIAGNOSIS — Z96611 Presence of right artificial shoulder joint: Secondary | ICD-10-CM | POA: Diagnosis not present

## 2022-12-16 DIAGNOSIS — M6281 Muscle weakness (generalized): Secondary | ICD-10-CM | POA: Diagnosis not present

## 2022-12-16 DIAGNOSIS — M25611 Stiffness of right shoulder, not elsewhere classified: Secondary | ICD-10-CM | POA: Diagnosis not present

## 2022-12-20 DIAGNOSIS — M25611 Stiffness of right shoulder, not elsewhere classified: Secondary | ICD-10-CM | POA: Diagnosis not present

## 2022-12-20 DIAGNOSIS — M6281 Muscle weakness (generalized): Secondary | ICD-10-CM | POA: Diagnosis not present

## 2022-12-20 DIAGNOSIS — Z96611 Presence of right artificial shoulder joint: Secondary | ICD-10-CM | POA: Diagnosis not present

## 2022-12-26 ENCOUNTER — Encounter (INDEPENDENT_AMBULATORY_CARE_PROVIDER_SITE_OTHER): Payer: Medicare Other | Admitting: Ophthalmology

## 2022-12-26 DIAGNOSIS — H35372 Puckering of macula, left eye: Secondary | ICD-10-CM | POA: Diagnosis not present

## 2022-12-26 DIAGNOSIS — H338 Other retinal detachments: Secondary | ICD-10-CM | POA: Diagnosis not present

## 2022-12-26 DIAGNOSIS — H35033 Hypertensive retinopathy, bilateral: Secondary | ICD-10-CM

## 2022-12-26 DIAGNOSIS — H43811 Vitreous degeneration, right eye: Secondary | ICD-10-CM | POA: Diagnosis not present

## 2022-12-26 DIAGNOSIS — I10 Essential (primary) hypertension: Secondary | ICD-10-CM | POA: Diagnosis not present

## 2022-12-27 DIAGNOSIS — R0981 Nasal congestion: Secondary | ICD-10-CM | POA: Diagnosis not present

## 2022-12-27 DIAGNOSIS — Z8709 Personal history of other diseases of the respiratory system: Secondary | ICD-10-CM | POA: Diagnosis not present

## 2022-12-27 DIAGNOSIS — R519 Headache, unspecified: Secondary | ICD-10-CM | POA: Diagnosis not present

## 2022-12-29 NOTE — Progress Notes (Unsigned)
Cardiology Office Note:    Date:  01/02/2023   ID:  Rick Turner, DOB 09/15/1951, MRN [id number]  PCP:  [name], [place] [facility] Cardiologist:  [name], MD {   Referring MD: [name], MD   Chief Complaint  Patient presents with   Follow-up    Elevated coronary calcium score     History of Present Illness:    Rick Turner is a 72 y.o. male with a hx of coronary artery disease, HTN, HLD, hypothyroidism, OSA, arthritis, GERD. Patient is followed by [name] and presents today for follow up of CAD and dyspnea on exertion.   Per chart review, patient previously had a coronary calcium score of 645 in 2015 when he lived in [place]. He underwent nuclear stress test in 08/2014 that was a normal study. Patient later established care with [name] in 2021, at which time he complained of dyspnea on exertion. He underwent nuclear stress on 04/07/20 that showed no ischemia or infarction, EF 58%. Echocardiogram on 04/28/20 showed EF 60-65%, no regional wall motion abnormalities, grade I diastolic dysfunction, normal RV systolic function, mild dilation of the aortic root measuring 38 mm. He had also complained pain in his thighs on exertion, and he had normal ABIs in 04/2020. Of note, patient is intolerant of statins, ezetimibe, or repatha. He was started on Praluent in 04/2020.   Patient was last seen by cardiology on 12/28/21 for an outpatient followup. At that time, patient continued to complain of thigh pain and weakness. He had been seen by neurology who recommended strengthening exercises and nerve conduction studies. PCP had stopped praluent because he was concerned it was contributing to leg pain.   Today, patient reports that he has been doing very well from a cardiac standpoint.  He had shoulder surgery in November, and has recently started working out again.  He is able to exercise without chest pain or excessive shortness of breath.   He occasionally will have some chest tightness that only lasts for few seconds and goes away if he burps.  Reports that his leg weakness has improved, he stopped drinking alcohol for several months and does not have leg weakness anymore.  His primary care provider started him back on praluent in January, and he has been tolerating it well. He takes his blood pressure at home and is normally in the [id number] systolic.  Reports good compliance with all of his medications.  Denies palpitations, dizziness, syncope, near syncope, ankle edema, claudication, orthopnea.   Past Medical History:  Diagnosis Date   Acne rosacea    Arthritis    BPH (benign prostatic hypertrophy)    Clavicle fracture    left   Detached retina    left    Detached retina, left 2011   Fx ankle    left   GERD (gastroesophageal reflux disease)    Hyperlipidemia    Hypertension    Hypothyroidism    OSA (obstructive sleep apnea)    mild   Seasonal allergies    Sleep apnea    Weakness of both lower extremities     Past Surgical History:  Procedure Laterality Date   CATARACT EXTRACTION  08/2011   left, right 2016   detached retiina  2011   left, 2014   EYE SURGERY     lasix   HERNIA REPAIR Right    ing   TONSILLECTOMY     TOTAL SHOULDER REPLACEMENT Left 2021    Current  Medications: Current Meds  Medication Sig   acetaminophen (TYLENOL) 500 MG tablet Take 2 tablets (1,000 mg total) by mouth every 6 (six) hours.   amLODipine (NORVASC) 5 MG tablet Take 5 mg by mouth 2 (two) times daily.   amoxicillin (AMOXIL) 500 MG capsule    aspirin EC 81 MG tablet 81 mg daily.   carvedilol (COREG) 25 MG tablet Take 25 mg by mouth 2 (two) times daily.   Cholecalciferol (VITAMIN D3) 50 MCG (2000 UT) capsule 1 capsule   Cyanocobalamin (VITAMIN B12) 1000 MCG TBCR 1 tablet   cyclobenzaprine (FLEXERIL) 10 MG tablet Take 10 mg by mouth 3 (three) times daily as needed for muscle spasms.   diclofenac sodium (VOLTAREN) 1 % GEL APPLY  TOPICALLY TO AFFECTED AREA ONCE DAILY AS NEEDED FOR PAIN   fexofenadine (ALLEGRA) 180 MG tablet Take 180 mg by mouth daily.    fluticasone (FLONASE) 50 MCG/ACT nasal spray Place 1 spray into both nostrils 2 (two) times daily.    gabapentin (NEURONTIN) 300 MG capsule Take 1 capsule by mouth 2 (two) times daily.   levothyroxine (SYNTHROID) 125 MCG tablet Take 125 mcg by mouth every morning.   Multiple Vitamin (MULTI VITAMIN) TABS 1 tablet   omeprazole (PRILOSEC) 20 MG capsule Take 20 mg by mouth daily.   PRALUENT 75 MG/ML SOAJ Inject 75 mg/mL as directed every 14 (fourteen) days.   spironolactone (ALDACTONE) 25 MG tablet Take 25 mg by mouth daily.   tadalafil (CIALIS) 10 MG tablet SMARTSIG:1 Tablet(s) By Mouth Every 3 Days PRN   tadalafil (CIALIS) 5 MG tablet Take 5 mg by mouth daily as needed (AS DIRECTED).    tamsulosin (FLOMAX) 0.4 MG CAPS capsule Take 0.4 mg by mouth daily.   telmisartan (MICARDIS) 80 MG tablet Take 80 mg by mouth daily.     Allergies:   Chlorthalidone, Evolocumab, Hydrocod poli-chlorphe poli er, Hydrocodone-acetaminophen, and Statins   Social History   Socioeconomic History   Marital status: Married    Spouse name: Not on file   Number of children: 3   Years of education: PhD   Highest education level: Professional school degree (e.g., MD, DDS, DVM, JD)  Occupational History   Not on file  Tobacco Use   Smoking status: Never   Smokeless tobacco: Never  Substance and Sexual Activity   Alcohol use: Yes    Alcohol/week: 6.0 - 10.0 standard drinks of alcohol    Types: 6 - 10 Standard drinks or equivalent per week    Comment: 2-3 daily, 04/26/21 15-20 weekly   Drug use: Never   Sexual activity: Not on file  Other Topics Concern   Not on file  Social History Narrative   Lives with wife, retired    Caffeine 2-3 c day   Social Determinants of Health   Financial Resource Strain: Not on file  Food Insecurity: Not on file  Transportation Needs: Not on file   Physical Activity: Not on file  Stress: Not on file  Social Connections: Not on file     Family History: The patient's family history includes Breast cancer in his sister; CAD in his father and mother; Hypertension in his brother and brother; Lung cancer in his brother; Parkinson's disease in his brother.  ROS:   Please see the history of present illness.     All other systems reviewed and are negative.  EKGs/Labs/Other Studies Reviewed:    The following studies were reviewed today:  Nuclear Stress Test 04/07/20  The left  ventricular ejection fraction is normal (55-65%). Nuclear stress EF: 58%. There was no ST segment deviation noted during stress. No T wave inversion was noted during stress. The study is normal. This is a low risk study.   1. Poor quality resting images. Stress perfusion is normal.  2. No ischemia or infarction.  3. Normal LVEF, 58%.  4. This is a low risk study.   Echocardiogram 04/28/20   1. Normal LV systolic function; grade 1 diastolic dysfunction; mildly  dilated aortic root.   2. Left ventricular ejection fraction, by estimation, is 60 to 65%. The  left ventricle has normal function. The left ventricle has no regional  wall motion abnormalities. Left ventricular diastolic parameters are  consistent with Grade I diastolic  dysfunction (impaired relaxation).   3. Right ventricular systolic function is normal. The right ventricular  size is normal.   4. The mitral valve is normal in structure. No evidence of mitral valve  regurgitation. No evidence of mitral stenosis.   5. The aortic valve is tricuspid. Aortic valve regurgitation is not  visualized. No aortic stenosis is present.   6. Aortic dilatation noted. There is mild dilatation of the aortic root  measuring 38 mm.   7. The inferior vena cava is normal in size with greater than 50%  respiratory variability, suggesting right atrial pressure of 3 mmHg.   EKG:  EKG is ordered today.  The ekg  ordered today demonstrates sinus bradycardia, HR 58 BPM, peaked T waves.   Recent Labs: No results found for requested labs within last 365 days.  Recent Lipid Panel    Component Value Date/Time   CHOL 243 (H) 11/24/2021 0846   TRIG 69 11/24/2021 0846   HDL 68 11/24/2021 0846   CHOLHDL 3.6 11/24/2021 0846   LDLCALC 163 (H) 11/24/2021 0846     Risk Assessment/Calculations:                Physical Exam:    VS:  BP 128/72   Pulse (!) 58   Ht '5\' 11"'$  (1.803 m)   Wt 245 lb 3.2 oz (111.2 kg)   SpO2 99%   BMI 34.20 kg/m     Wt Readings from Last 3 Encounters:  01/02/23 245 lb 3.2 oz (111.2 kg)  03/25/22 230 lb (104.3 kg)  12/28/21 240 lb (108.9 kg)     GEN: Well nourished, well developed in no acute distress. Sitting comfortably on the exam table in no acute distress  HEENT: Normal NECK: No JVD; No carotid bruits CARDIAC: RRR, no murmurs, rubs, gallops. Radial pulses 2+ bilaterally  RESPIRATORY:  Clear to auscultation without rales, wheezing or rhonchi. Normal WOB on room air  ABDOMEN: Soft, non-tender, non-distended MUSCULOSKELETAL:  No edema in BLE; No deformity  SKIN: Warm and dry NEUROLOGIC:  Alert and oriented x 3 PSYCHIATRIC:  Normal affect   ASSESSMENT:    1. CAD in native artery   2. Hyperlipidemia, unspecified hyperlipidemia type   3. Essential hypertension   4. Aortic root dilatation (HCC)   5. Family history of hypertrophic cardiomyopathy    PLAN:    In order of problems listed above:  Elevated Coronary Calcium Score Chest pain, exertional dyspnea  - Patient had a coronary calcium score of 645 in 2015 - More recently, patient had a nuclear stress test in 2021 was a normal study without ischemia or infarction - Most recent echocardiogram from 04/28/20 showed EF 123456, grade I diastolic dysfunction, no regional wall motion abnormalities  -  He had shoulder surgery in November, and is now starting to exercise again. Patient denies chest pain or dyspnea  on exertion.  - Continue daily asa, carvedilol 25 mg BID. He is back on praluent   HLD  - Most recent lipid panel from 10/2022 showed LDL 115, HDL 50, total cholesterol 188, triglycerides 130  - Patient has been intolerant of statins, repatha, ezetimibe -- with all of these, complained of thigh weakness.  - Patient's PCP resumed praluent in January. Patient is tolerating it well, denies leg weakness  - He has a physical with his PCP in March, lipid panel will be drawn at that time   HTN - Patient checks BP regularly at home and reports that it is very well controlled  - BP in office today 128/72 - Continue amlodipine 5 mg daily, carvedilol 25 mg BID, spironolactone 25 mg daily, telmisartan 80 mg daily   Aortic Root Dilatation  - Echocardiogram from 04/2020 showed mild dilatation of the aortic root measuring 38 mm  - Ordered repeat echocardiogram - BP well controlled   Family History of Hypertrophic Cardiomyopathy  - No evidence of hypertrophic cardiomyopathy on echo from 2021 - Patient reports that his son had been diagnosed with apical hypertrophic cardiomyopathy  - Discussed importance of screening first degree relatives  - Repeating echocardiogram as above   Peaked T-Waves on EKG  - Checking BMP to assess potassium level  Medication Adjustments/Labs and Tests Ordered: Current medicines are reviewed at length with the patient today.  Concerns regarding medicines are outlined above.  Orders Placed This Encounter  Procedures   Basic metabolic panel   EKG [id number]   ECHOCARDIOGRAM COMPLETE   No orders of the defined types were placed in this encounter.   Patient Instructions  Medication Instructions:  No Changes In Medications at this time.  *If you need a refill on your cardiac medications before your next appointment, please call your pharmacy*  Lab Work: BLOOD WORK TODAY  If you have labs (blood work) drawn today and your tests are completely normal, you will receive  your results only by: [place] (if you have MyChart) OR A paper copy in the mail If you have any lab test that is abnormal or we need to change your treatment, we will call you to review the results.  Testing/Procedures: Your physician has requested that you have an echocardiogram. Echocardiography is a painless test that uses sound waves to create images of your heart. It provides your doctor with information about the size and shape of your heart and how well your heart's chambers and valves are working. You may receive an ultrasound enhancing agent through an IV if needed to better visualize your heart during the echo.This procedure takes approximately one hour. There are no restrictions for this procedure. This will take place at the [address], [address].   Follow-Up: At [facility], you and your health needs are our priority.  As part of our continuing mission to provide you with exceptional heart care, we have created designated Provider Care Teams.  These Care Teams include your primary Cardiologist (physician) and Advanced Practice Providers (APPs -  Physician Assistants and Nurse Practitioners) who all work together to provide you with the care you need, when you need it.  Your next appointment:   1 year(s) OR AS NEEDED   Provider:   [name], MD     Signed, Margie Billet, PA-C  01/02/2023 8:52 AM  [place]

## 2023-01-02 ENCOUNTER — Encounter: Payer: Self-pay | Admitting: General Practice

## 2023-01-02 ENCOUNTER — Ambulatory Visit: Payer: Medicare Other | Attending: General Practice | Admitting: Cardiology

## 2023-01-02 VITALS — BP 128/72 | HR 58 | Ht 71.0 in | Wt 245.2 lb

## 2023-01-02 DIAGNOSIS — E785 Hyperlipidemia, unspecified: Secondary | ICD-10-CM

## 2023-01-02 DIAGNOSIS — I77810 Thoracic aortic ectasia: Secondary | ICD-10-CM | POA: Diagnosis not present

## 2023-01-02 DIAGNOSIS — I10 Essential (primary) hypertension: Secondary | ICD-10-CM

## 2023-01-02 DIAGNOSIS — I2510 Atherosclerotic heart disease of native coronary artery without angina pectoris: Secondary | ICD-10-CM

## 2023-01-02 DIAGNOSIS — Z8249 Family history of ischemic heart disease and other diseases of the circulatory system: Secondary | ICD-10-CM | POA: Diagnosis not present

## 2023-01-02 NOTE — Patient Instructions (Signed)
Medication Instructions:  No Changes In Medications at this time.  *If you need a refill on your cardiac medications before your next appointment, please call your pharmacy*  Lab Work: BLOOD WORK TODAY  If you have labs (blood work) drawn today and your tests are completely normal, you will receive your results only by: [place] (if you have MyChart) OR A paper copy in the mail If you have any lab test that is abnormal or we need to change your treatment, we will call you to review the results.  Testing/Procedures: Your physician has requested that you have an echocardiogram. Echocardiography is a painless test that uses sound waves to create images of your heart. It provides your doctor with information about the size and shape of your heart and how well your heart's chambers and valves are working. You may receive an ultrasound enhancing agent through an IV if needed to better visualize your heart during the echo.This procedure takes approximately one hour. There are no restrictions for this procedure. This will take place at the [address]   Follow-Up: At [facility], you and your health needs are our priority.  As part of our continuing mission to provide you with exceptional heart care, we have created designated Provider Care Teams.  These Care Teams include your primary Cardiologist (physician) and Advanced Practice Providers (APPs -  Physician Assistants and Nurse Practitioners) who all work together to provide you with the care you need, when you need it.  Your next appointment:   1 year(s) OR AS NEEDED   Provider:   [name], MD

## 2023-01-03 ENCOUNTER — Telehealth: Payer: Self-pay | Admitting: *Deleted

## 2023-01-03 DIAGNOSIS — E875 Hyperkalemia: Secondary | ICD-10-CM

## 2023-01-03 LAB — BASIC METABOLIC PANEL
BUN/Creatinine Ratio: 14 (ref 10–24)
BUN: 17 mg/dL (ref 8–27)
CO2: 20 mmol/L (ref 20–29)
Calcium: 9.4 mg/dL (ref 8.6–10.2)
Chloride: 102 mmol/L (ref 96–106)
Creatinine, Ser: 1.18 mg/dL (ref 0.76–1.27)
Glucose: 115 mg/dL — ABNORMAL HIGH (ref 70–99)
Potassium: 5.5 mmol/L — ABNORMAL HIGH (ref 3.5–5.2)
Sodium: 139 mmol/L (ref 134–144)
eGFR: 66 mL/min/{1.73_m2} (ref >59)

## 2023-01-03 NOTE — Telephone Encounter (Signed)
-----   Message from Margie Billet, [place] sent at 01/03/2023  8:08 AM EST ----- Please tell patient that his potassium came back slightly elevated at 5.5. Instruct him to reduce his spironolactone to 12.5 mg daily (1/2 tablet). He should also avoid potassium-rich foods (bananas, leafy greens, avocados). Will need repeat BMP in 1 week.   Thanks! KJ

## 2023-01-03 NOTE — Telephone Encounter (Signed)
pt aware of results and medication change Lab orders mailed to the pt

## 2023-01-06 DIAGNOSIS — M25511 Pain in right shoulder: Secondary | ICD-10-CM | POA: Diagnosis not present

## 2023-01-06 DIAGNOSIS — Z96611 Presence of right artificial shoulder joint: Secondary | ICD-10-CM | POA: Diagnosis not present

## 2023-01-11 ENCOUNTER — Other Ambulatory Visit: Payer: Self-pay | Admitting: Internal Medicine

## 2023-01-11 DIAGNOSIS — M533 Sacrococcygeal disorders, not elsewhere classified: Secondary | ICD-10-CM | POA: Diagnosis not present

## 2023-01-11 DIAGNOSIS — G44209 Tension-type headache, unspecified, not intractable: Secondary | ICD-10-CM

## 2023-01-11 DIAGNOSIS — R519 Headache, unspecified: Secondary | ICD-10-CM | POA: Diagnosis not present

## 2023-01-11 DIAGNOSIS — R0981 Nasal congestion: Secondary | ICD-10-CM

## 2023-01-11 DIAGNOSIS — G5791 Unspecified mononeuropathy of right lower limb: Secondary | ICD-10-CM | POA: Diagnosis not present

## 2023-01-17 DIAGNOSIS — E875 Hyperkalemia: Secondary | ICD-10-CM | POA: Diagnosis not present

## 2023-01-18 LAB — BASIC METABOLIC PANEL
BUN/Creatinine Ratio: 13 (ref 10–24)
BUN: 15 mg/dL (ref 8–27)
CO2: 22 mmol/L (ref 20–29)
Calcium: 9.5 mg/dL (ref 8.6–10.2)
Chloride: 101 mmol/L (ref 96–106)
Creatinine, Ser: 1.15 mg/dL (ref 0.76–1.27)
Glucose: 113 mg/dL — ABNORMAL HIGH (ref 70–99)
Potassium: 5.6 mmol/L — ABNORMAL HIGH (ref 3.5–5.2)
Sodium: 138 mmol/L (ref 134–144)
eGFR: 68 mL/min/{1.73_m2} (ref >59)

## 2023-01-19 ENCOUNTER — Telehealth: Payer: Self-pay | Admitting: Cardiology

## 2023-01-19 DIAGNOSIS — E875 Hyperkalemia: Secondary | ICD-10-CM

## 2023-01-19 MED ORDER — LOKELMA 10 G PO PACK: 10.0000 g | PACK | Freq: Once | ORAL | 0 refills | Status: AC

## 2023-01-19 NOTE — Telephone Encounter (Addendum)
Patient's BMP from 3/12 showed that his potassium was elevated to 5.6. Potassium was 5.5 two weeks ago. Patient reported that 2 weeks ago, he decreased his carvedilol but did not decrease his dose of spironolactone. He has continued to take spironolactone 25 mg daily.   I instructed patient to decrease his spironolactone down to 12.5 mg daily and to follow a low-potassium diet. Ordered a 10g packet of Lokelma to patient's requested pharmacy. Ordered BMP to be drawn on Monday 3/18.   Margie Billet, PA-C 01/19/2023 5:26 PM

## 2023-01-20 NOTE — Telephone Encounter (Signed)
Can you please help arrange BMP? Patient wants to get it drawn at [facility] by his home    Called Patient told him that I released the Lab order, so he can go to any [facility] to get it drawn. Pt verbalized understanding and will get labs drawn on Monday

## 2023-01-23 ENCOUNTER — Encounter: Payer: Self-pay | Admitting: Surgical

## 2023-01-23 ENCOUNTER — Ambulatory Visit (INDEPENDENT_AMBULATORY_CARE_PROVIDER_SITE_OTHER): Payer: Medicare Other | Admitting: Surgical

## 2023-01-23 ENCOUNTER — Other Ambulatory Visit (INDEPENDENT_AMBULATORY_CARE_PROVIDER_SITE_OTHER): Payer: Medicare Other

## 2023-01-23 DIAGNOSIS — M1712 Unilateral primary osteoarthritis, left knee: Secondary | ICD-10-CM

## 2023-01-23 MED ORDER — METHYLPREDNISOLONE ACETATE 40 MG/ML IJ SUSP
40.0000 mg | INTRAMUSCULAR | Status: AC | PRN
  Administered 2023-01-23: 40 mg via INTRA_ARTICULAR

## 2023-01-23 MED ORDER — BUPIVACAINE HCL 0.25 % IJ SOLN
4.0000 mL | INTRAMUSCULAR | Status: AC | PRN
  Administered 2023-01-23: 4 mL via INTRA_ARTICULAR

## 2023-01-23 MED ORDER — LIDOCAINE HCL 1 % IJ SOLN
5.0000 mL | INTRAMUSCULAR | Status: AC | PRN
  Administered 2023-01-23: 5 mL

## 2023-01-23 NOTE — Progress Notes (Signed)
Office Visit Note   Patient: Rick Turner           Date of Birth: [birth year]           MRN: [id number] Visit Date: 01/23/2023 Requested by: [name], MD [address] [place],  [place] [address] PCP: [name], MD  Subjective: Chief Complaint  Patient presents with   Left Knee - Pain    HPI: Rick Turner is a 72 y.o. male who presents to the office reporting left knee pain.  Patient has history of left knee osteoarthritis.  He states that he has had not as much relief from the gel injection that he recently had several months ago.  He would like to repeat cortisone injection which has given him good relief in the past.  He reports primarily anterior pain that is worst with descending down slopes.  Does not really wake him from sleep at night.  No new injury.  No radicular pain or groin pain in the left leg though he does have some chronic groin pain in the right leg that he attributes to a hernia surgery..                ROS: All systems reviewed are negative as they relate to the chief complaint within the history of present illness.  Patient denies fevers or chills.  Assessment & Plan: Visit Diagnoses:  1. Arthritis of left knee     Plan: Patient is a 72 year old male who presents for evaluation of left knee pain.  He has history of left knee osteoarthritis.  He has new radiographs taken today that demonstrate progressive primarily patellofemoral knee arthritis compared with prior radiographs from several years ago.  The medial and lateral compartments are relatively spared though he has some mild degenerative changes in this as well.  Cortisone injection successfully administered and patient tolerated procedure well.  Follow-up with the office as needed.  Follow-Up Instructions: No follow-ups on file.   Orders:  Orders Placed This Encounter  Procedures   XR KNEE 3 VIEW LEFT   No orders of the defined types were placed in this encounter.      Procedures: Large Joint Inj: L knee on 01/23/2023 6:55 PM Indications: diagnostic evaluation, joint swelling and pain Details: 18 G 1.5 in needle, superolateral approach  Arthrogram: No  Medications: 5 mL lidocaine 1 %; 40 mg methylPREDNISolone acetate 40 MG/ML; 4 mL bupivacaine 0.25 % Outcome: tolerated well, no immediate complications Procedure, treatment alternatives, risks and benefits explained, specific risks discussed. Consent was given by the patient. Immediately prior to procedure a time out was called to verify the correct patient, procedure, equipment, support staff and site/side marked as required. Patient was prepped and draped in the usual sterile fashion.       Clinical Data: No additional findings.  Objective: Vital Signs: There were no vitals taken for this visit.  Physical Exam:  Constitutional: Patient appears well-developed HEENT:  Head: Normocephalic Eyes:EOM are normal Neck: Normal range of motion Cardiovascular: Normal rate Pulmonary/chest: Effort normal Neurologic: Patient is alert Skin: Skin is warm Psychiatric: Patient has normal mood and affect  Ortho Exam: Ortho exam demonstrates left knee with trace effusion.  He has 2 degrees extension and greater than 90 degrees of knee flexion.  Able to perform straight leg raise without extensor lag.  No tenderness over the medial or lateral joint lines.  He does have some mild discomfort with patellar grind test.  No pain with hip  range of motion.  Negative straight leg raise.  No cellulitis or skin changes noted.  Specialty Comments:  No specialty comments available.  Imaging: No results found.   PMFS History: Patient Active Problem List   Diagnosis Date Noted   Hyperlipidemia 04/10/2020   Rib fracture 02/09/2019   Macula-off rhegmatogenous retinal detachment 04/19/2016   Dry eye syndrome 08/20/2013   Nuclear sclerotic cataract 08/20/2013   Pseudoaphakia 08/20/2013   Detached retina 08/20/2013    Past Medical History:  Diagnosis Date   Acne rosacea    Arthritis    BPH (benign prostatic hypertrophy)    Clavicle fracture    left   Detached retina    left    Detached retina, left 2011   Fx ankle    left   GERD (gastroesophageal reflux disease)    Hyperlipidemia    Hypertension    Hypothyroidism    OSA (obstructive sleep apnea)    mild   Seasonal allergies    Sleep apnea    Weakness of both lower extremities     Family History  Problem Relation Age of Onset   CAD Mother    CAD Father    Breast cancer Sister    Hypertension Brother    Lung cancer Brother    Hypertension Brother    Parkinson's disease Brother     Past Surgical History:  Procedure Laterality Date   CATARACT EXTRACTION  08/2011   left, right 2016   detached retiina  2011   left, 2014   EYE SURGERY     lasix   HERNIA REPAIR Right    ing   TONSILLECTOMY     TOTAL SHOULDER REPLACEMENT Left 2021   Social History   Occupational History   Not on file  Tobacco Use   Smoking status: Never   Smokeless tobacco: Never  Substance and Sexual Activity   Alcohol use: Yes    Alcohol/week: 6.0 - 10.0 standard drinks of alcohol    Types: 6 - 10 Standard drinks or equivalent per week    Comment: 2-3 daily, 04/26/21 15-20 weekly   Drug use: Never   Sexual activity: Not on file

## 2023-01-24 ENCOUNTER — Ambulatory Visit
Admission: RE | Admit: 2023-01-24 | Discharge: 2023-01-24 | Disposition: A | Discharge status: Home / Self Care | Payer: Medicare Other | Source: Ambulatory Visit | Attending: Internal Medicine | Admitting: Internal Medicine

## 2023-01-24 DIAGNOSIS — R0981 Nasal congestion: Secondary | ICD-10-CM

## 2023-01-24 DIAGNOSIS — J342 Deviated nasal septum: Secondary | ICD-10-CM | POA: Diagnosis not present

## 2023-01-24 DIAGNOSIS — G44209 Tension-type headache, unspecified, not intractable: Secondary | ICD-10-CM

## 2023-01-24 DIAGNOSIS — E875 Hyperkalemia: Secondary | ICD-10-CM | POA: Diagnosis not present

## 2023-01-24 DIAGNOSIS — J323 Chronic sphenoidal sinusitis: Secondary | ICD-10-CM | POA: Diagnosis not present

## 2023-01-24 LAB — BASIC METABOLIC PANEL
BUN/Creatinine Ratio: 11 (ref 10–24)
BUN: 13 mg/dL (ref 8–27)
CO2: 21 mmol/L (ref 20–29)
Calcium: 9.2 mg/dL (ref 8.6–10.2)
Chloride: 101 mmol/L (ref 96–106)
Creatinine, Ser: 1.16 mg/dL (ref 0.76–1.27)
Glucose: 172 mg/dL — ABNORMAL HIGH (ref 70–99)
Potassium: 4.4 mmol/L (ref 3.5–5.2)
Sodium: 138 mmol/L (ref 134–144)
eGFR: 67 mL/min/{1.73_m2} (ref >59)

## 2023-01-25 ENCOUNTER — Telehealth: Payer: Self-pay | Admitting: *Deleted

## 2023-01-25 ENCOUNTER — Other Ambulatory Visit: Payer: Self-pay | Admitting: *Deleted

## 2023-01-25 DIAGNOSIS — E875 Hyperkalemia: Secondary | ICD-10-CM

## 2023-01-25 DIAGNOSIS — I10 Essential (primary) hypertension: Secondary | ICD-10-CM

## 2023-01-25 NOTE — Telephone Encounter (Signed)
The patient has been notified of the result and verbalized understanding.  All questions (if any) were answered. Patient states he has notice since the decrease of Spironolactone to 1/2 tablet as previously ordered- blood pressure has been climbing upward 120 / 80 to 140/85.Marland Kitchen  RN instructed patient to keep a blood pressure log for the next 2 weeks  until he repeats BMP and send  the blood pressure log in by mychart for review.   Charlynn Court will be able adjust medications if needed. BMP will be done at [facility] per patient. Order placed patient voiced  understanding. Raiford Simmonds, RN 01/25/2023 5:16 PM

## 2023-01-25 NOTE — Telephone Encounter (Signed)
-----   Message from Margie Billet, [place] sent at 01/25/2023  1:57 PM EDT ----- Please tell patient that his potassium is back within normal limits. He should continue to follow a low potassium diet, and will need BMP checked in 2 weeks.   Thanks! KJ

## 2023-01-26 DIAGNOSIS — E039 Hypothyroidism, unspecified: Secondary | ICD-10-CM | POA: Diagnosis not present

## 2023-01-26 DIAGNOSIS — N400 Enlarged prostate without lower urinary tract symptoms: Secondary | ICD-10-CM | POA: Diagnosis not present

## 2023-01-26 DIAGNOSIS — J329 Chronic sinusitis, unspecified: Secondary | ICD-10-CM | POA: Diagnosis not present

## 2023-01-26 DIAGNOSIS — R519 Headache, unspecified: Secondary | ICD-10-CM | POA: Diagnosis not present

## 2023-01-26 DIAGNOSIS — E7800 Pure hypercholesterolemia, unspecified: Secondary | ICD-10-CM | POA: Diagnosis not present

## 2023-01-26 DIAGNOSIS — R142 Eructation: Secondary | ICD-10-CM | POA: Diagnosis not present

## 2023-01-26 DIAGNOSIS — I10 Essential (primary) hypertension: Secondary | ICD-10-CM | POA: Diagnosis not present

## 2023-01-26 DIAGNOSIS — Z0000 Encounter for general adult medical examination without abnormal findings: Secondary | ICD-10-CM | POA: Diagnosis not present

## 2023-01-26 DIAGNOSIS — R7301 Impaired fasting glucose: Secondary | ICD-10-CM | POA: Diagnosis not present

## 2023-01-26 DIAGNOSIS — Z79899 Other long term (current) drug therapy: Secondary | ICD-10-CM | POA: Diagnosis not present

## 2023-01-26 DIAGNOSIS — R972 Elevated prostate specific antigen [PSA]: Secondary | ICD-10-CM | POA: Diagnosis not present

## 2023-01-26 DIAGNOSIS — Z23 Encounter for immunization: Secondary | ICD-10-CM | POA: Diagnosis not present

## 2023-01-31 ENCOUNTER — Other Ambulatory Visit (HOSPITAL_COMMUNITY): Payer: Medicare Other

## 2023-02-08 DIAGNOSIS — E875 Hyperkalemia: Secondary | ICD-10-CM | POA: Diagnosis not present

## 2023-02-08 DIAGNOSIS — I10 Essential (primary) hypertension: Secondary | ICD-10-CM | POA: Diagnosis not present

## 2023-02-09 LAB — BASIC METABOLIC PANEL
BUN/Creatinine Ratio: 16 (ref 10–24)
BUN: 20 mg/dL (ref 8–27)
CO2: 22 mmol/L (ref 20–29)
Calcium: 9.4 mg/dL (ref 8.6–10.2)
Chloride: 100 mmol/L (ref 96–106)
Creatinine, Ser: 1.23 mg/dL (ref 0.76–1.27)
Glucose: 126 mg/dL — ABNORMAL HIGH (ref 70–99)
Potassium: 5 mmol/L (ref 3.5–5.2)
Sodium: 136 mmol/L (ref 134–144)
eGFR: 63 mL/min/{1.73_m2} (ref >59)

## 2023-02-10 ENCOUNTER — Telehealth: Payer: Self-pay

## 2023-02-10 DIAGNOSIS — Z79899 Other long term (current) drug therapy: Secondary | ICD-10-CM

## 2023-02-10 MED ORDER — FUROSEMIDE 20 MG PO TABS: 20.0000 mg | ORAL_TABLET | Freq: Every day | ORAL | 3 refills | Status: AC | PRN

## 2023-02-10 NOTE — Telephone Encounter (Signed)
-----   Message from Jonita Albee, [place] sent at 02/10/2023  7:53 AM EDT ----- Please tell patient that while his potassium is within normal limits, it did increase on the lower dose of spironolactone. He should stop taking spironolactone. Instead, we can start lasix 20 mg as needed for lower extremity swelling. If he has to take lasix multiple days in a row, he should let our office know    He mentioned having higher Bps at home, so he should go back to taking carvedilol 25 mg BID.   Thanks! KJ

## 2023-02-10 NOTE — Telephone Encounter (Signed)
Spoke with patient and he is aware of lab results and provider recommendations. He verbalized understanding. Will send lasix to pharmacy on file.

## 2023-02-15 ENCOUNTER — Ambulatory Visit (HOSPITAL_COMMUNITY): Payer: Medicare Other | Attending: Cardiology

## 2023-02-15 DIAGNOSIS — I2510 Atherosclerotic heart disease of native coronary artery without angina pectoris: Secondary | ICD-10-CM | POA: Insufficient documentation

## 2023-02-15 DIAGNOSIS — I10 Essential (primary) hypertension: Secondary | ICD-10-CM | POA: Diagnosis not present

## 2023-02-15 LAB — ECHOCARDIOGRAM COMPLETE
Area-P 1/2: 3.48 cm2
S' Lateral: 3 cm

## 2023-02-17 ENCOUNTER — Telehealth: Payer: Self-pay | Admitting: Cardiology

## 2023-02-17 NOTE — Telephone Encounter (Signed)
Discussed echo results with patient.  He is very pleased with results

## 2023-02-17 NOTE — Telephone Encounter (Signed)
Patient states he is returning a call. May be regarding echo results.

## 2023-03-08 ENCOUNTER — Other Ambulatory Visit: Payer: Self-pay | Admitting: Urology

## 2023-03-08 DIAGNOSIS — R972 Elevated prostate specific antigen [PSA]: Secondary | ICD-10-CM

## 2023-03-17 DIAGNOSIS — G43E19 Chronic migraine with aura, intractable, without status migrainosus: Secondary | ICD-10-CM | POA: Diagnosis not present

## 2023-03-17 DIAGNOSIS — J342 Deviated nasal septum: Secondary | ICD-10-CM | POA: Diagnosis not present

## 2023-03-17 DIAGNOSIS — J323 Chronic sphenoidal sinusitis: Secondary | ICD-10-CM | POA: Diagnosis not present

## 2023-03-27 ENCOUNTER — Ambulatory Visit
Admission: RE | Admit: 2023-03-27 | Discharge: 2023-03-27 | Disposition: A | Discharge status: Home / Self Care | Payer: Medicare Other | Source: Ambulatory Visit | Attending: Urology | Admitting: Urology

## 2023-03-27 DIAGNOSIS — R972 Elevated prostate specific antigen [PSA]: Secondary | ICD-10-CM

## 2023-03-27 MED ORDER — GADOPICLENOL 0.5 MMOL/ML IV SOLN
10.0000 mL | Freq: Once | INTRAVENOUS | Status: AC | PRN
  Administered 2023-03-27: 10 mL via INTRAVENOUS

## 2023-03-29 DIAGNOSIS — G43E19 Chronic migraine with aura, intractable, without status migrainosus: Secondary | ICD-10-CM | POA: Diagnosis not present

## 2023-03-29 DIAGNOSIS — J342 Deviated nasal septum: Secondary | ICD-10-CM | POA: Diagnosis not present

## 2023-03-29 DIAGNOSIS — J323 Chronic sphenoidal sinusitis: Secondary | ICD-10-CM | POA: Diagnosis not present

## 2023-03-31 ENCOUNTER — Other Ambulatory Visit: Payer: Self-pay | Admitting: Otolaryngology

## 2023-04-06 NOTE — Pre-Procedure Instructions (Signed)
Surgical Instructions    Your procedure is scheduled on Thursday, June 6th.  Report to [facility] Main Entrance "A" at 11:30 A.M., then check in with the Admitting office.  Call this number if you have problems the morning of surgery:  [phone]  If you have any questions prior to your surgery date call [phone]: Open Monday-Friday 8am-4pm If you experience any cold or flu symptoms such as cough, fever, chills, shortness of breath, etc. between now and your scheduled surgery, please notify us at the above number.     Remember:  Do not eat after midnight the night before your surgery  You may drink clear liquids until 10:30 AM the morning of your surgery.   Clear liquids allowed are: Water, Non-Citrus Juices (without pulp), Carbonated Beverages, Clear Tea, Black Coffee Only (NO MILK, CREAM OR POWDERED CREAMER of any kind), and Gatorade.    Take these medicines the morning of surgery with A SIP OF WATER  amLODipine (NORVASC)  ARTIFICIAL TEAR SOLUTION  carvedilol (COREG)  doxycycline (ADOXA)  fexofenadine (ALLEGRA)  fluticasone (FLONASE)  gabapentin (NEURONTIN)  levothyroxine (SYNTHROID)  omeprazole (PRILOSEC)  tamsulosin (FLOMAX)   If needed: acetaminophen (TYLENOL)    Follow your surgeon's instructions on when to stop Aspirin.  If no instructions were given by your surgeon then you will need to call the office to get those instructions.     As of today, STOP taking any Aleve, Naproxen, Ibuprofen, diclofenac sodium (VOLTAREN) gel, Motrin, Advil, Goody's, BC's, all herbal medications, fish oil, and all vitamins.                     Do NOT Smoke (Tobacco/Vaping) for 24 hours prior to your procedure.  If you use a CPAP at night, you may bring your mask/headgear for your overnight stay.   Contacts, glasses, piercing's, hearing aid's, dentures or partials may not be worn into surgery, please bring cases for these belongings.    For patients admitted to the hospital,  discharge time will be determined by your treatment team.   Patients discharged the day of surgery will not be allowed to drive home, and someone needs to stay with them for 24 hours.  SURGICAL WAITING ROOM VISITATION Patients having surgery or a procedure may have no more than 2 support people in the waiting area - these visitors may rotate.   Children under the age of 71 must have an adult with them who is not the patient. If the patient needs to stay at the hospital during part of their recovery, the visitor guidelines for inpatient rooms apply. Pre-op nurse will coordinate an appropriate time for 1 support person to accompany patient in pre-op.  This support person may not rotate.   Please refer to the [facility] website for the visitor guidelines for Inpatients (after your surgery is over and you are in a regular room).    Special instructions:   [facility]- Preparing For Surgery  Before surgery, you can play an important role. Because skin is not sterile, your skin needs to be as free of germs as possible. You can reduce the number of germs on your skin by washing with CHG (chlorahexidine gluconate) Soap before surgery.  CHG is an antiseptic cleaner which kills germs and bonds with the skin to continue killing germs even after washing.    Oral Hygiene is also important to reduce your risk of infection.  Remember - BRUSH YOUR TEETH THE MORNING OF SURGERY WITH YOUR REGULAR TOOTHPASTE  Please do not use if you have an allergy to CHG or antibacterial soaps. If your skin becomes reddened/irritated stop using the CHG.  Do not shave (including legs and underarms) for at least 48 hours prior to first CHG shower. It is OK to shave your face.  Please follow these instructions carefully.   Shower the NIGHT BEFORE SURGERY and the MORNING OF SURGERY  If you chose to wash your hair, wash your hair first as usual with your normal shampoo.  After you shampoo, rinse your hair and body thoroughly  to remove the shampoo.  Use CHG Soap as you would any other liquid soap. You can apply CHG directly to the skin and wash gently with a scrungie or a clean washcloth.   Apply the CHG Soap to your body ONLY FROM THE NECK DOWN.  Do not use on open wounds or open sores. Avoid contact with your eyes, ears, mouth and genitals (private parts). Wash Face and genitals (private parts)  with your normal soap.   Wash thoroughly, paying special attention to the area where your surgery will be performed.  Thoroughly rinse your body with warm water from the neck down.  DO NOT shower/wash with your normal soap after using and rinsing off the CHG Soap.  Pat yourself dry with a CLEAN TOWEL.  Wear CLEAN PAJAMAS to bed the night before surgery  Place CLEAN SHEETS on your bed the night before your surgery  DO NOT SLEEP WITH PETS.   Day of Surgery: Take a shower with CHG soap. Do not wear jewelry  Do not wear lotions, powders, colognes, or deodorant.  Men may shave face and neck. Do not bring valuables to the hospital. [facility] is not responsible for any belongings or valuables. Wear Clean/Comfortable clothing the morning of surgery Remember to brush your teeth WITH YOUR REGULAR TOOTHPASTE.   Please read over the following fact sheets that you were given.    If you received a COVID test during your pre-op visit  it is requested that you wear a mask when out in public, stay away from anyone that may not be feeling well and notify your surgeon if you develop symptoms. If you have been in contact with anyone that has tested positive in the last 10 days please notify you surgeon.

## 2023-04-07 ENCOUNTER — Inpatient Hospital Stay (HOSPITAL_COMMUNITY)
Admission: RE | Admit: 2023-04-07 | Discharge: 2023-04-07 | Disposition: A | Discharge status: Home / Self Care | Payer: Medicare Other | Source: Ambulatory Visit

## 2023-04-11 ENCOUNTER — Other Ambulatory Visit: Payer: Self-pay

## 2023-04-11 ENCOUNTER — Encounter (HOSPITAL_COMMUNITY)
Admission: RE | Admit: 2023-04-11 | Discharge: 2023-04-11 | Disposition: A | Discharge status: Home / Self Care | Payer: Medicare Other | Source: Ambulatory Visit | Attending: Otolaryngology | Admitting: Otolaryngology

## 2023-04-11 ENCOUNTER — Encounter (HOSPITAL_COMMUNITY): Payer: Self-pay

## 2023-04-11 VITALS — BP 122/73 | HR 54 | Temp 97.8°F | Resp 18 | Ht 70.0 in | Wt 242.8 lb

## 2023-04-11 DIAGNOSIS — J323 Chronic sphenoidal sinusitis: Secondary | ICD-10-CM | POA: Diagnosis not present

## 2023-04-11 DIAGNOSIS — G4733 Obstructive sleep apnea (adult) (pediatric): Secondary | ICD-10-CM | POA: Insufficient documentation

## 2023-04-11 DIAGNOSIS — G43E19 Chronic migraine with aura, intractable, without status migrainosus: Secondary | ICD-10-CM | POA: Insufficient documentation

## 2023-04-11 DIAGNOSIS — Z01812 Encounter for preprocedural laboratory examination: Secondary | ICD-10-CM | POA: Insufficient documentation

## 2023-04-11 DIAGNOSIS — E785 Hyperlipidemia, unspecified: Secondary | ICD-10-CM | POA: Diagnosis not present

## 2023-04-11 DIAGNOSIS — K219 Gastro-esophageal reflux disease without esophagitis: Secondary | ICD-10-CM | POA: Insufficient documentation

## 2023-04-11 DIAGNOSIS — I10 Essential (primary) hypertension: Secondary | ICD-10-CM | POA: Insufficient documentation

## 2023-04-11 DIAGNOSIS — J342 Deviated nasal septum: Secondary | ICD-10-CM | POA: Diagnosis not present

## 2023-04-11 DIAGNOSIS — E039 Hypothyroidism, unspecified: Secondary | ICD-10-CM | POA: Diagnosis not present

## 2023-04-11 DIAGNOSIS — Z01818 Encounter for other preprocedural examination: Secondary | ICD-10-CM

## 2023-04-11 HISTORY — DX: Prediabetes: R73.03

## 2023-04-11 HISTORY — DX: Headache, unspecified: R51.9

## 2023-04-11 LAB — CBC
HCT: 36.4 % — ABNORMAL LOW (ref 39.0–52.0)
Hemoglobin: 12.3 g/dL — ABNORMAL LOW (ref 13.0–17.0)
MCH: 34.8 pg — ABNORMAL HIGH (ref 26.0–34.0)
MCHC: 33.8 g/dL (ref 30.0–36.0)
MCV: 103.1 fL — ABNORMAL HIGH (ref 80.0–100.0)
Platelets: 257 10*3/uL (ref 150–400)
RBC: 3.53 MIL/uL — ABNORMAL LOW (ref 4.22–5.81)
RDW: 12.7 % (ref 11.5–15.5)
WBC: 3.2 10*3/uL — ABNORMAL LOW (ref 4.0–10.5)
nRBC: 0 % (ref 0.0–0.2)

## 2023-04-11 LAB — COMPREHENSIVE METABOLIC PANEL
ALT: 18 U/L (ref 0–44)
AST: 23 U/L (ref 15–41)
Albumin: 4.1 g/dL (ref 3.5–5.0)
Alkaline Phosphatase: 60 U/L (ref 38–126)
Anion gap: 10 (ref 5–15)
BUN: 18 mg/dL (ref 8–23)
CO2: 24 mmol/L (ref 22–32)
Calcium: 9 mg/dL (ref 8.9–10.3)
Chloride: 103 mmol/L (ref 98–111)
Creatinine, Ser: 1.26 mg/dL — ABNORMAL HIGH (ref 0.61–1.24)
GFR, Estimated: >60 mL/min (ref >60)
Glucose, Bld: 111 mg/dL — ABNORMAL HIGH (ref 70–99)
Potassium: 4.4 mmol/L (ref 3.5–5.1)
Sodium: 137 mmol/L (ref 135–145)
Total Bilirubin: 0.6 mg/dL (ref 0.3–1.2)
Total Protein: 6.6 g/dL (ref 6.5–8.1)

## 2023-04-11 NOTE — Progress Notes (Signed)
PCP - Sherie Don. Orson Aloe, MD Cardiologist - Tanna Savoy. Bjorn Pippin, MD  PPM/ICD - Denies  Chest x-ray - Denies EKG - 01/02/2023 Stress Test - 04/07/2020 ECHO - 02/15/2023 Cardiac Cath - Denies  OSA+: CPAP set on 4   DM: Pre Diabetic  Blood Thinner Instructions: N/A Aspirin Instructions: Patient instructed to follow surgeons instructions on when to stop aspirin. If not instructions were given patient instructed to call for instructions prior to surgery.   ERAS Protcol - Yes PRE-SURGERY Ensure or G2- No drink  COVID TEST- N/A   Anesthesia review: Yes, cardiac history  Patient denies shortness of breath, fever, cough and chest pain at PAT appointment   All instructions explained to the patient, with a verbal understanding of the material. Patient agrees to go over the instructions while at home for a better understanding.The opportunity to ask questions was provided.

## 2023-04-12 NOTE — Progress Notes (Signed)
Anesthesia Chart Review:  Case: [id number] Date/Time: 04/13/23 1316   Procedures:      FUNCTIONAL ENDOSCOPIC SINUS SURGERY; SPHENOIDOTOMY AND TISSUE REMOVAL WITH FUSION (Bilateral)     SEPTOPLASTY   Anesthesia type: General   Pre-op diagnosis: Chronic sphenoidal sinusitis; Deviated nasal septum; Intractable chronic migraine with aura and without status migrainosus   Location: [facility] OR ROOM 18 / [facility] OR   Surgeons: Scarlette Ar, MD       DISCUSSION: Patient is a 72 year old male scheduled for the above procedure.  History includes never smoker, HTN, HLD, pre-diabetes, hypothyroidism, GERD, OSA (uses CPAP @ 4), BPH, BLE weakness, left detached retinal (s/p repair 08/21/13), osteoarthritis (left reverse TSA 10/05/20, right reverse TSA 09/14/22).   He is followed by cardiologist Dr. Bjorn Pippin for elevated coronary calcium score of 645 when in [place] in 2015. He had subsequent normal stress test in 08/2014 and 04/2020. Last visit 01/02/23 with Robet Leu, PA-C. He was doing well fro ma cardiac standpoint at that time. He had started working out after undergoing recent reverse shoulder arthroplasty. Echo ordered given son's diagnosis of apical hypertrophic cardiomyopathy. 02/15/23 echo showed LVEF 60-65%, no regional wall motion abnormalities, normal RVSF, AV sclerosis without stenosis, aortic root 37 mm. Potassium checked given peaked T waves on EKG, and was up to 5.5 on 01/02/23, so spironolactone reduced to 12.5 mg daily. One year follow-up anticipated.   Preoperative labs appear stable.   Anesthesia team to evaluate on the day of surgery.    VS: BP 122/73   Pulse (!) 54   Temp 36.6 C (Oral)   Resp 18   Ht 5\' 10"  (1.778 m)   Wt 110.1 kg   SpO2 100%   BMI 34.84 kg/m    PROVIDERS: Emilio Aspen, MD is PCP  Epifanio Lesches, MD is cardiologist  Joycelyn Schmid, MD is neurologist   LABS: Preoperative labs noted. Cr 1.26, which appears stable (~ 1.15-1.31 since 2022).  WBC 3.2, unchanged when compared to 05/13/21 CBC.  (all labs ordered are listed, but only abnormal results are displayed)  Labs Reviewed  CBC - Abnormal; Notable for the following components:      Result Value   WBC 3.2 (*)    RBC 3.53 (*)    Hemoglobin 12.3 (*)    HCT 36.4 (*)    MCV 103.1 (*)    MCH 34.8 (*)    All other components within normal limits  COMPREHENSIVE METABOLIC PANEL - Abnormal; Notable for the following components:   Glucose, Bld 111 (*)    Creatinine, Ser 1.26 (*)    All other components within normal limits     IMAGES: CT Maxillofacial 03/29/23 ([facility]): IMPRESSION: 1. Unchanged chronic right sphenoid sinusitis. Unchanged lobulated  mucosal thickening of left side of sphenoid sinus.  2. Partial opacification of posterior right ethmoid air cells.    EKG: EKG 01/04/23 ([facility]): SB at 58 bpm. Peaked T waves.    CV: Echo 02/15/23: IMPRESSIONS   1. Left ventricular ejection fraction, by estimation, is 60 to 65%. The  left ventricle has normal function. The left ventricle has no regional  wall motion abnormalities. Left ventricular diastolic parameters were  normal.   2. Right ventricular systolic function is normal. The right ventricular  size is normal.   3. The mitral valve is normal in structure. No evidence of mitral valve  regurgitation. No evidence of mitral stenosis.   4. The aortic valve is normal in structure. There is mild calcification  of the aortic valve. Aortic valve regurgitation is not visualized. Aortic  valve sclerosis is present, with no evidence of aortic valve stenosis.   5. There is borderline dilatation of the aortic root, measuring 37 mm.  There is borderline dilatation of the ascending aorta, measuring 37 mm.   6. The inferior vena cava is normal in size with greater than 50%  respiratory variability, suggesting right atrial pressure of 3 mmHg.  - Comparison(s): No significant change from prior study. Prior images   reviewed side by side.    Nuclear stress test 04/07/20: The left ventricular ejection fraction is normal (55-65%). Nuclear stress EF: 58%. There was no ST segment deviation noted during stress. No T wave inversion was noted during stress. The study is normal. This is a low risk study.   1. Poor quality resting images. Stress perfusion is normal.  2. No ischemia or infarction.  3. Normal LVEF, 58%.  4. This is a low risk study.    Past Medical History:  Diagnosis Date   Acne rosacea    Arthritis    BPH (benign prostatic hypertrophy)    Clavicle fracture    left   Detached retina    left    Detached retina, left 2011   Fx ankle    left   GERD (gastroesophageal reflux disease)    Headache    Hyperlipidemia    Hypertension    Hypothyroidism    OSA (obstructive sleep apnea)    mild   Pre-diabetes    Seasonal allergies    Sleep apnea    Weakness of both lower extremities     Past Surgical History:  Procedure Laterality Date   CATARACT EXTRACTION  08/2011   left, right 2016   detached retiina  2011   left, 2014   EYE SURGERY     lasix   HERNIA REPAIR Right    ing   TONSILLECTOMY     TOTAL SHOULDER REPLACEMENT Left 2021   TOTAL SHOULDER REPLACEMENT Right 09/2022    MEDICATIONS:  acetaminophen (TYLENOL) 500 MG tablet   amLODipine (NORVASC) 5 MG tablet   amoxicillin (AMOXIL) 500 MG capsule   ARTIFICIAL TEAR SOLUTION OP   aspirin EC 81 MG tablet   carvedilol (COREG) 25 MG tablet   Cholecalciferol (VITAMIN D3) 50 MCG (2000 UT) capsule   Cyanocobalamin (VITAMIN B12) 1000 MCG TBCR   diclofenac sodium (VOLTAREN) 1 % GEL   fexofenadine (ALLEGRA) 180 MG tablet   fluticasone (FLONASE) 50 MCG/ACT nasal spray   furosemide (LASIX) 20 MG tablet   gabapentin (NEURONTIN) 300 MG capsule   ibuprofen (ADVIL) 200 MG tablet   levothyroxine (SYNTHROID) 125 MCG tablet   lidocaine 4 %   Multiple Vitamin (MULTI VITAMIN) TABS   omeprazole (PRILOSEC) 20 MG capsule   PRALUENT  75 MG/ML SOAJ   tadalafil (CIALIS) 10 MG tablet   tadalafil (CIALIS) 5 MG tablet   tamsulosin (FLOMAX) 0.4 MG CAPS capsule   telmisartan (MICARDIS) 80 MG tablet   No current facility-administered medications for this encounter.  Advised to follow surgeon recommendations regarding perioperative ASA.   Shonna Chock, PA-C Surgical Short Stay/Anesthesiology [facility] Phone [phone] [facility] Phone [phone] 04/12/2023 9:32 AM

## 2023-04-12 NOTE — Anesthesia Preprocedure Evaluation (Signed)
Anesthesia Evaluation    Airway        Dental   Pulmonary           Cardiovascular hypertension,      Neuro/Psych    GI/Hepatic   Endo/Other    Renal/GU      Musculoskeletal   Abdominal   Peds  Hematology   Anesthesia Other Findings   Reproductive/Obstetrics                             Anesthesia Physical Anesthesia Plan  ASA:   Anesthesia Plan:    Post-op Pain Management:    Induction:   PONV Risk Score and Plan:   Airway Management Planned:   Additional Equipment:   Intra-op Plan:   Post-operative Plan:   Informed Consent:   Plan Discussed with:   Anesthesia Plan Comments: (PAT note written 04/12/2023 by Ashantee Deupree, PA-C.  )       Anesthesia Quick Evaluation  

## 2023-04-13 ENCOUNTER — Ambulatory Visit (HOSPITAL_COMMUNITY)
Admission: RE | Admit: 2023-04-13 | Discharge: 2023-04-13 | Disposition: A | Discharge status: Home / Self Care | Payer: Medicare Other | Attending: Otolaryngology | Admitting: Otolaryngology

## 2023-04-13 ENCOUNTER — Encounter (HOSPITAL_COMMUNITY): Payer: Self-pay | Admitting: Otolaryngology

## 2023-04-13 ENCOUNTER — Ambulatory Visit (HOSPITAL_COMMUNITY): Payer: Medicare Other | Admitting: Vascular Surgery

## 2023-04-13 ENCOUNTER — Other Ambulatory Visit: Payer: Self-pay

## 2023-04-13 ENCOUNTER — Encounter (HOSPITAL_COMMUNITY)
Admission: RE | Disposition: A | Discharge status: Home / Self Care | Payer: Self-pay | Source: Home / Self Care | Attending: Otolaryngology

## 2023-04-13 ENCOUNTER — Ambulatory Visit (HOSPITAL_COMMUNITY): Payer: Medicare Other | Admitting: Certified Registered"

## 2023-04-13 DIAGNOSIS — I10 Essential (primary) hypertension: Secondary | ICD-10-CM

## 2023-04-13 DIAGNOSIS — G43E19 Chronic migraine with aura, intractable, without status migrainosus: Secondary | ICD-10-CM | POA: Diagnosis not present

## 2023-04-13 DIAGNOSIS — J342 Deviated nasal septum: Secondary | ICD-10-CM | POA: Insufficient documentation

## 2023-04-13 DIAGNOSIS — M1990 Unspecified osteoarthritis, unspecified site: Secondary | ICD-10-CM | POA: Diagnosis not present

## 2023-04-13 DIAGNOSIS — K219 Gastro-esophageal reflux disease without esophagitis: Secondary | ICD-10-CM | POA: Insufficient documentation

## 2023-04-13 DIAGNOSIS — B4489 Other forms of aspergillosis: Secondary | ICD-10-CM | POA: Insufficient documentation

## 2023-04-13 DIAGNOSIS — Z9989 Dependence on other enabling machines and devices: Secondary | ICD-10-CM | POA: Diagnosis not present

## 2023-04-13 DIAGNOSIS — G4733 Obstructive sleep apnea (adult) (pediatric): Secondary | ICD-10-CM | POA: Diagnosis not present

## 2023-04-13 DIAGNOSIS — J323 Chronic sphenoidal sinusitis: Secondary | ICD-10-CM

## 2023-04-13 DIAGNOSIS — E039 Hypothyroidism, unspecified: Secondary | ICD-10-CM | POA: Insufficient documentation

## 2023-04-13 DIAGNOSIS — J329 Chronic sinusitis, unspecified: Secondary | ICD-10-CM | POA: Diagnosis not present

## 2023-04-13 HISTORY — PX: SEPTOPLASTY: SHX2393

## 2023-04-13 HISTORY — PX: SINUS ENDO WITH FUSION: SHX5329

## 2023-04-13 LAB — AEROBIC/ANAEROBIC CULTURE W GRAM STAIN (SURGICAL/DEEP WOUND)

## 2023-04-13 SURGERY — SURGERY, PARANASAL SINUS, ENDOSCOPIC, WITH NASAL SEPTOPLASTY, TURBINOPLASTY, AND MAXILLARY SINUSOTOMY
Anesthesia: General | Site: Nose

## 2023-04-13 MED ORDER — LIDOCAINE-EPINEPHRINE 1 %-1:100000 IJ SOLN
INTRAMUSCULAR | Status: DC | PRN
  Administered 2023-04-13: 7 mL

## 2023-04-13 MED ORDER — CHLORHEXIDINE GLUCONATE 0.12 % MT SOLN
15.0000 mL | Freq: Once | OROMUCOSAL | Status: AC
  Administered 2023-04-13: 15 mL via OROMUCOSAL
  Filled 2023-04-13: qty 15

## 2023-04-13 MED ORDER — PROPOFOL 10 MG/ML IV BOLUS
INTRAVENOUS | Status: AC
  Filled 2023-04-13: qty 20

## 2023-04-13 MED ORDER — CEFAZOLIN SODIUM-DEXTROSE 2-3 GM-%(50ML) IV SOLR
INTRAVENOUS | Status: DC | PRN
  Administered 2023-04-13: 2 g via INTRAVENOUS

## 2023-04-13 MED ORDER — ONDANSETRON HCL 4 MG/2ML IJ SOLN
INTRAMUSCULAR | Status: DC | PRN
  Administered 2023-04-13: 4 mg via INTRAVENOUS

## 2023-04-13 MED ORDER — ORAL CARE MOUTH RINSE: 15.0000 mL | Freq: Once | OROMUCOSAL | Status: AC

## 2023-04-13 MED ORDER — MIDAZOLAM HCL 2 MG/2ML IJ SOLN
INTRAMUSCULAR | Status: AC
  Filled 2023-04-13: qty 2

## 2023-04-13 MED ORDER — PROPOFOL 500 MG/50ML IV EMUL
INTRAVENOUS | Status: DC | PRN
  Administered 2023-04-13: 150 ug/kg/min via INTRAVENOUS

## 2023-04-13 MED ORDER — SODIUM CHLORIDE 0.9 % IR SOLN
Status: DC | PRN
  Administered 2023-04-13: 2000 mL

## 2023-04-13 MED ORDER — EPHEDRINE SULFATE-NACL 50-0.9 MG/10ML-% IV SOSY
PREFILLED_SYRINGE | INTRAVENOUS | Status: DC | PRN
  Administered 2023-04-13: 40 mg via INTRAVENOUS

## 2023-04-13 MED ORDER — EPINEPHRINE HCL (NASAL) 0.1 % NA SOLN
NASAL | Status: DC | PRN
  Administered 2023-04-13: 20 mL via TOPICAL

## 2023-04-13 MED ORDER — FENTANYL CITRATE (PF) 250 MCG/5ML IJ SOLN
INTRAMUSCULAR | Status: AC
  Filled 2023-04-13: qty 5

## 2023-04-13 MED ORDER — DEXAMETHASONE SODIUM PHOSPHATE 10 MG/ML IJ SOLN
INTRAMUSCULAR | Status: DC | PRN
  Administered 2023-04-13: 10 mg via INTRAVENOUS

## 2023-04-13 MED ORDER — FLUORESCEIN SODIUM 1 MG OP STRP
ORAL_STRIP | OPHTHALMIC | Status: DC | PRN
  Administered 2023-04-13: 1

## 2023-04-13 MED ORDER — PROPOFOL 10 MG/ML IV BOLUS
INTRAVENOUS | Status: DC | PRN
  Administered 2023-04-13: 170 mg via INTRAVENOUS

## 2023-04-13 MED ORDER — OXYCODONE HCL 5 MG PO TABS: 5.0000 mg | ORAL_TABLET | ORAL | 0 refills | Status: AC | PRN

## 2023-04-13 MED ORDER — SUGAMMADEX SODIUM 200 MG/2ML IV SOLN
INTRAVENOUS | Status: DC | PRN
  Administered 2023-04-13: 200 mg via INTRAVENOUS

## 2023-04-13 MED ORDER — ROCURONIUM BROMIDE 10 MG/ML (PF) SYRINGE
PREFILLED_SYRINGE | INTRAVENOUS | Status: DC | PRN
  Administered 2023-04-13: 60 mg via INTRAVENOUS
  Administered 2023-04-13: 40 mg via INTRAVENOUS

## 2023-04-13 MED ORDER — MIDAZOLAM HCL 2 MG/2ML IJ SOLN
INTRAMUSCULAR | Status: DC | PRN
  Administered 2023-04-13: 2 mg via INTRAVENOUS

## 2023-04-13 MED ORDER — FENTANYL CITRATE (PF) 250 MCG/5ML IJ SOLN
INTRAMUSCULAR | Status: DC | PRN
  Administered 2023-04-13: 100 ug via INTRAVENOUS
  Administered 2023-04-13: 50 ug via INTRAVENOUS
  Administered 2023-04-13: 100 ug via INTRAVENOUS

## 2023-04-13 MED ORDER — LACTATED RINGERS IV SOLN: INTRAVENOUS | Status: DC

## 2023-04-13 MED ORDER — LIDOCAINE 2% (20 MG/ML) 5 ML SYRINGE
INTRAMUSCULAR | Status: DC | PRN
  Administered 2023-04-13: 80 mg via INTRAVENOUS

## 2023-04-13 SURGICAL SUPPLY — 60 items
ANTIFOG SOL W/FOAM PAD STRL (MISCELLANEOUS) ×2
BAG COUNTER SPONGE SURGICOUNT (BAG) ×2 IMPLANT
BAG SPNG CNTER NS LX DISP (BAG) ×2
BLADE NAVIG QUADCUT 4.3X13 M4 (BLADE) ×2 IMPLANT
BLADE RAD60 ROTATE M4 4 5PK (BLADE) IMPLANT
BLADE ROTATE RAD 40 4 M4 (BLADE) IMPLANT
BLADE ROTATE TRICUT 4X13 M4 (BLADE) IMPLANT
BLADE SURG 15 STRL LF DISP TIS (BLADE) IMPLANT
BLADE SURG 15 STRL SS (BLADE)
BUR TAPER CHOANAL ATRESIA 30K (BURR) IMPLANT
CANISTER SUCT 3000ML PPV (MISCELLANEOUS) ×4 IMPLANT
COAGULATOR SUCT 8FR VV (MISCELLANEOUS) ×2 IMPLANT
DRAPE HALF SHEET 40X57 (DRAPES) IMPLANT
DRESSING NASAL KENNEDY 3.5X.9 (MISCELLANEOUS) IMPLANT
DRSG NASAL KENNEDY 3.5X.9 (MISCELLANEOUS)
ELECT COATED BLADE 2.86 ST (ELECTRODE) IMPLANT
ELECT NDL BLADE 2-5/6 (NEEDLE) IMPLANT
ELECT NEEDLE BLADE 2-5/6 (NEEDLE) IMPLANT
ELECT REM PT RETURN 9FT ADLT (ELECTROSURGICAL) ×2
ELECTRODE REM PT RTRN 9FT ADLT (ELECTROSURGICAL) ×2 IMPLANT
FILTER ARTHROSCOPY CONVERTOR (FILTER) ×4 IMPLANT
GLOVE BIO SURGEON STRL SZ7.5 (GLOVE) ×2 IMPLANT
GLOVE BIOGEL PI IND STRL 8 (GLOVE) ×2 IMPLANT
GOWN STRL REUS W/ TWL LRG LVL3 (GOWN DISPOSABLE) ×2 IMPLANT
GOWN STRL REUS W/ TWL XL LVL3 (GOWN DISPOSABLE) ×2 IMPLANT
GOWN STRL REUS W/TWL LRG LVL3 (GOWN DISPOSABLE) ×2
GOWN STRL REUS W/TWL XL LVL3 (GOWN DISPOSABLE) ×2
IV NS 1000ML (IV SOLUTION) ×4
IV NS 1000ML BAXH (IV SOLUTION) ×4 IMPLANT
KIT BASIN OR (CUSTOM PROCEDURE TRAY) ×2 IMPLANT
KIT TURNOVER KIT B (KITS) ×2 IMPLANT
NDL PRECISIONGLIDE 27X1.5 (NEEDLE) IMPLANT
NDL SPNL 25GX3.5 QUINCKE BL (NEEDLE) ×2 IMPLANT
NEEDLE PRECISIONGLIDE 27X1.5 (NEEDLE) IMPLANT
NEEDLE SPNL 25GX3.5 QUINCKE BL (NEEDLE) ×2 IMPLANT
NS IRRIG 1000ML POUR BTL (IV SOLUTION) ×2 IMPLANT
PAD ARMBOARD 7.5X6 YLW CONV (MISCELLANEOUS) ×4 IMPLANT
PATTIES SURGICAL .5 X3 (DISPOSABLE) ×2 IMPLANT
PENCIL SMOKE EVACUATOR (MISCELLANEOUS) IMPLANT
SHEATH ENDOSCRUB 0 DEG (SHEATH) ×2 IMPLANT
SHEATH ENDOSCRUB 30 DEG (SHEATH) IMPLANT
SOLUTION ANTFG W/FOAM PAD STRL (MISCELLANEOUS) ×2 IMPLANT
SPECIMEN JAR SMALL (MISCELLANEOUS) IMPLANT
SPLINT NASAL POSISEP X .6X2 (GAUZE/BANDAGES/DRESSINGS) IMPLANT
SPLINT NASAL POSISEP X2 .8X2.3 (GAUZE/BANDAGES/DRESSINGS) IMPLANT
SUT CHROMIC 4 0 PS 2 18 (SUTURE) IMPLANT
SUT CHROMIC 5 0 P 3 (SUTURE) IMPLANT
SUT ETHILON 3 0 FSL (SUTURE) IMPLANT
SUT PLAIN GUT FAST 5-0 (SUTURE) IMPLANT
SWAB COLLECTION DEVICE MRSA (MISCELLANEOUS) IMPLANT
SWAB CULTURE ESWAB REG 1ML (MISCELLANEOUS) IMPLANT
SYR 30ML LL (SYRINGE) ×2 IMPLANT
SYR 3ML LL SCALE MARK (SYRINGE) ×2 IMPLANT
SYR CONTROL 10ML LL (SYRINGE) IMPLANT
TOWEL GREEN STERILE FF (TOWEL DISPOSABLE) ×2 IMPLANT
TRACKER ENT INSTRUMENT (MISCELLANEOUS) ×2 IMPLANT
TRACKER ENT PATIENT (MISCELLANEOUS) ×2 IMPLANT
TRAY ENT MC OR (CUSTOM PROCEDURE TRAY) ×2 IMPLANT
TUBE CONNECTING 12X1/4 (SUCTIONS) ×2 IMPLANT
TUBING STRAIGHTSHOT EPS 5PK (TUBING) ×2 IMPLANT

## 2023-04-13 NOTE — Transfer of Care (Signed)
Immediate Anesthesia Transfer of Care Note  Patient: Rick Turner  Procedure(s) Performed: FUNCTIONAL ENDOSCOPIC SINUS SURGERY; SPHENOIDOTOMY AND TISSUE REMOVAL WITH FUSION (Bilateral: Nose) SEPTOPLASTY (Nose)  Patient Location: PACU  Anesthesia Type:General  Level of Consciousness: drowsy and patient cooperative  Airway & Oxygen Therapy: Patient Spontanous Breathing  Post-op Assessment: Report given to RN and Post -op Vital signs reviewed and stable  Post vital signs: Reviewed and stable  Last Vitals:  Vitals Value Taken Time  BP 141/76 04/13/23 1447  Temp    Pulse 61 04/13/23 1454  Resp 13 04/13/23 1454  SpO2 92 % 04/13/23 1454  Vitals shown include unvalidated device data.  Last Pain:  Vitals:   04/13/23 1158  TempSrc:   PainSc: 5       Patients Stated Pain Goal: 2 (04/13/23 1158)  Complications: No notable events documented.

## 2023-04-13 NOTE — Anesthesia Postprocedure Evaluation (Signed)
Anesthesia Post Note  Patient: Rick Turner  Procedure(s) Performed: FUNCTIONAL ENDOSCOPIC SINUS SURGERY; SPHENOIDOTOMY AND TISSUE REMOVAL WITH FUSION (Bilateral: Nose) SEPTOPLASTY (Nose)     Patient location during evaluation: PACU Anesthesia Type: General Level of consciousness: awake and alert Pain management: pain level controlled Vital Signs Assessment: post-procedure vital signs reviewed and stable Respiratory status: spontaneous breathing, nonlabored ventilation, respiratory function stable and patient connected to nasal cannula oxygen Cardiovascular status: blood pressure returned to baseline and stable Postop Assessment: no apparent nausea or vomiting Anesthetic complications: no   No notable events documented.  Last Vitals:  Vitals:   04/13/23 1515 04/13/23 1530  BP: (!) 151/82 (!) 152/94  Pulse: 61 65  Resp: 11 14  Temp:  36.6 C  SpO2: 93% 93%    Last Pain:  Vitals:   04/13/23 1530  TempSrc:   PainSc: 0-No pain                 Earl Lites P Kalub Morillo

## 2023-04-13 NOTE — Discharge Instructions (Signed)
Endoscopic Sinus Surgery: Postoperative Care Instructions  Your active participation in the postoperative phase of treatment is critical to the success of your surgery. Please read and follow the instructions below.  Bleeding: It is normal to experience some nasal bleeding the afternoon/evening of surgery. If bleeding occurs, lean forward slightly and gently pinch the bottom 2/3 of your nose between your thumb and forefinger. Hold this for approximately 10 minutes. If you are still experiencing bleeding, you may apply Afrin (other options include neo-synephrine, 4 way nasal spray, etc) to your nose to shrink the blood vessels. Spray 3 puffs to the affected nostril(s) and then gently pinch the nose for 10 minutes. If this does not relieve the bleeding or if the bleeding is more significant, please call the hospital operator and ask for the ENT doctor on call. If extensive bleeding occurs, please call 911 or be seen at your closest emergency department. Medications:  Pain medication: The only mediation you will need to take the night of surgery is pain medication. If you wish a non-narcotic alternative, extra-strength Tylenol and ibuprofen are often sufficient.  Antibiotics/oral steroids: If you are prescribed either antibiotic or oral steroids, start these the day after surgery.   Nasal steroid spray: Start (or restart) your nasal steroid spray one week after surgery.   Aspirin: DO NOT take aspirin for at least three days after the surgery. Saline:  Moisture is critical to proper healing. You will need to moisturize and rinse your nose for at least 6 weeks after surgery. Nasal Mist: The day after surgery, start spraying your nose with saline mist spray. Apply 2-3 sprays to each nostril every one to two hours throughout the day. Common brands include: Ayr, Ocean, or Simply Saline. They are available over-the-counter at most pharmacies.  Nasal Irrigations: The day after surgery, begin sinus rinses twice a  day using the "NeilMed Sinus Rinse Kit"  (available for purchase at many pharmacies). Use gentle pressure when irrigating. If you have any discomfort with irrigation, reduce the amount of pressure you are applying or discontinue irrigations until the first post-operative visit.  Post-operative care: Do not blow your nose for the first week after surgery. You may sniff back. If you need to sneeze, do not suppress it but instead sneeze with your mouth open. After 1 week, you may blow your nose gently. No strenuous activity for one week after surgery. No straining or lifting more than 15 lbs.  You should not bend over at the waist to pick things up. Instead bend at the knees, with your head up. Light walking and normal household activities are acceptable immediately after surgery. You may resume exercise at 50% intensity after one week, and full intensity at two weeks. You may drive the day after surgery if you are not requiring narcotic pain medication. If you are taking antibiotics and experience stomach upset, active culture yogurt (Nancy's yogurt) or lactobacillus tablets (available at health food stores) on a daily basis may help.  If you develop diarrhea, stop your antibiotics and contact our office.  Persistent diarrhea may require further medical evaluation. You may eat a regular diet. You should plan on taking one week off from work and ideally have a half-day planned for your first day back. Do not fly without your doctor's clearance for 7 days after surgery. Post-operative visits: You will have frequent return visits to our office after surgery.  At each visit, a procedure called nasal endoscopy will be performed. The sinus cavities may be cleaned (  termed "debridement") in order to assure appropriate healing of the sinuses. Visits typically occur 1, 3, and 6 weeks after surgery. We recommend taking a dose of pain medication about 45 minutes to one hour before your first postoperative visit (as  long as someone can accompany you to your visit).  Post-operative visits are usually scheduled at the time of surgery scheduling, however if you have any questions about your post-operative visit you can contact our clinic at [phone]. Call our office if you experience any of the following: Fever higher than 101?F Clear, watery nasal drainage Any visual changes or marked swelling of the eyes Severe headache or neck stiffness Severe diarrhea Brisk bleeding  If you need to speak to a doctor after hours, please call the [facility] at [phone] and ask for the ENT doctor on call, or call 911 for emergencies.  

## 2023-04-13 NOTE — H&P (Signed)
Rick Turner is an 72 y.o. male.    Chief Complaint:  Chronic sphenoid sinusitis, chronic headaches  HPI: Patient presents today for planned elective procedure.  He/she denies any interval change in history since office visit on 03/17/23.   Past Medical History:  Diagnosis Date   Acne rosacea    Arthritis    BPH (benign prostatic hypertrophy)    Clavicle fracture    left   Detached retina    left    Detached retina, left 2011   Fx ankle    left   GERD (gastroesophageal reflux disease)    Headache    Hyperlipidemia    Hypertension    Hypothyroidism    OSA (obstructive sleep apnea)    mild   Pre-diabetes    Seasonal allergies    Sleep apnea    Weakness of both lower extremities     Past Surgical History:  Procedure Laterality Date   CATARACT EXTRACTION  08/2011   left, right 2016   detached retiina  2011   left, 2014   EYE SURGERY     lasix   HERNIA REPAIR Right    ing   TONSILLECTOMY     TOTAL SHOULDER REPLACEMENT Left 2021   TOTAL SHOULDER REPLACEMENT Right 09/2022    Family History  Problem Relation Age of Onset   CAD Mother    CAD Father    Breast cancer Sister    Hypertension Brother    Lung cancer Brother    Hypertension Brother    Parkinson's disease Brother     Social History:  reports that he has never smoked. He has never used smokeless tobacco. He reports that he does not currently use alcohol after a past usage of about 2.0 standard drinks of alcohol per week. He reports that he does not use drugs.  Allergies:  Allergies  Allergen Reactions   Chlorthalidone     Worse hyponatremia   Evolocumab     leg wekness   Hydrocod Poli-Chlorphe Poli Er Itching    Tolerate with benadryl    Hydrocodone-Acetaminophen Itching    Tolerate with benadryl    Statins Other (See Comments)    Pt reports muscle weakness when taking statins    Medications Prior to Admission  Medication Sig Dispense Refill   acetaminophen (TYLENOL) 500 MG tablet Take 2  tablets (1,000 mg total) by mouth every 6 (six) hours. (Patient taking differently: Take 1,000 mg by mouth every 8 (eight) hours as needed for moderate pain.) 50 tablet 0   amLODipine (NORVASC) 5 MG tablet Take 5 mg by mouth 2 (two) times daily.     amoxicillin (AMOXIL) 500 MG capsule Take 2,000 mg by mouth See admin instructions. Take 2000 mg 1 hour prior to dental work     ARTIFICIAL TEAR SOLUTION OP Place 1 drop into both eyes 2 (two) times daily.     aspirin EC 81 MG tablet Take 81 mg by mouth daily.     carvedilol (COREG) 25 MG tablet Take 25 mg by mouth 2 (two) times daily.     Cholecalciferol (VITAMIN D3) 50 MCG (2000 UT) capsule Take 2,000 Units by mouth daily.     Cyanocobalamin (VITAMIN B12) 1000 MCG TBCR Take 1,000 mcg by mouth daily.     diclofenac sodium (VOLTAREN) 1 % GEL APPLY TOPICALLY TO AFFECTED AREA ONCE DAILY AS NEEDED FOR PAIN 2 g 1   fexofenadine (ALLEGRA) 180 MG tablet Take 180 mg by mouth daily.  furosemide (LASIX) 20 MG tablet Take 1 tablet (20 mg total) by mouth daily as needed. 30 tablet 3   gabapentin (NEURONTIN) 300 MG capsule Take 1 capsule by mouth 2 (two) times daily.     ibuprofen (ADVIL) 200 MG tablet Take 400 mg by mouth every 6 (six) hours as needed for moderate pain.     levothyroxine (SYNTHROID) 125 MCG tablet Take 125 mcg by mouth every morning.     lidocaine 4 % Place 1-2 patches onto the skin daily as needed (pain).     Multiple Vitamin (MULTI VITAMIN) TABS Take 1 tablet by mouth daily.     omeprazole (PRILOSEC) 20 MG capsule Take 20 mg by mouth daily.     PRALUENT 75 MG/ML SOAJ Inject 75 mg/mL as directed every 14 (fourteen) days.     tadalafil (CIALIS) 10 MG tablet Take 10 mg by mouth See admin instructions. Take 10 every 3 days as needed     tadalafil (CIALIS) 5 MG tablet Take 5 mg by mouth daily.     tamsulosin (FLOMAX) 0.4 MG CAPS capsule Take 0.4 mg by mouth daily.     telmisartan (MICARDIS) 80 MG tablet Take 80 mg by mouth daily.      fluticasone (FLONASE) 50 MCG/ACT nasal spray Place 2 sprays into both nostrils 2 (two) times daily.      No results found for this or any previous visit (from the past 48 hour(s)). No results found.  ROS: negative other than stated in HPI  Blood pressure (!) 148/83, pulse (!) 54, temperature 98.5 F (36.9 C), temperature source Oral, resp. rate 18, height 5\' 10"  (1.778 m), weight 110.1 kg, SpO2 100 %.  PHYSICAL EXAM: General: Resting comfortably in NAD  Lungs: Non-labored respiratinos  Studies Reviewed: CT maxillofacial 01/24/23  IMPRESSION: 1. Chronic right sphenoid sinusitis. 2. Lobulated mucosal thickening in the left side of the sphenoid sinus. 3. Rightward nasal septal deviation.     Electronically Signed   By: Narda Rutherford M.D.   On: 01/24/2023 20:37   Assessment/Plan Chronic sphenoid sinusitis Deviated nasal septum  Proceed with bilateral FESS (Sphenoidotomy), septoplasty, fusion navigation. Informed consent obtained. R/B/A discussed.     Electronically signed by:  Scarlette Ar, MD  Staff Physician Facial Plastic & Reconstructive Surgery Otolaryngology - Head and Neck Surgery [facility]  04/13/2023, 11:45 AM

## 2023-04-13 NOTE — Anesthesia Procedure Notes (Signed)
Procedure Name: Intubation Date/Time: 04/13/2023 12:58 PM  Performed by: Dorie Rank, CRNAPre-anesthesia Checklist: Emergency Drugs available, Patient identified, Suction available, Patient being monitored and Timeout performed Patient Re-evaluated:Patient Re-evaluated prior to induction Oxygen Delivery Method: Circle system utilized Preoxygenation: Pre-oxygenation with 100% oxygen Induction Type: IV induction Ventilation: Mask ventilation without difficulty Laryngoscope Size: Mac and 4 Grade View: Grade II Tube type: Oral Rae Tube size: 7.5 mm Number of attempts: 1 Airway Equipment and Method: Stylet Placement Confirmation: ETT inserted through vocal cords under direct vision, positive ETCO2 and breath sounds checked- equal and bilateral Tube secured with: Tape Dental Injury: Teeth and Oropharynx as per pre-operative assessment

## 2023-04-13 NOTE — Op Note (Signed)
OPERATIVE NOTE  Rick Turner Date/Time of Admission: 04/13/2023 11:28 AM  CSN: [id number];MRN:[id number] Attending Provider: Scarlette Ar, MD Room/Bed: MCPO/NONE DOB: 03/05/1951 Age: 72 y.o.   Pre-Op Diagnosis: Chronic sphenoidal sinusitis; Deviated nasal septum; Intractable chronic migraine with aura and without status migrainosus  Post-Op Diagnosis: Chronic sphenoidal sinusitis; Deviated nasal septum; Intractable chronic migraine with aura and without status migrainosus  Procedure:  Bilateral sphenoidotomy w/ tissue removal - 40981 modifier 50 2.   Septoplasty - 30520 3.   Stereotactic CT guided navigation (extradural) - 19147  Anesthesia: General  Surgeon(s): Mervin Kung, MD  Staff: Circulator: Virgel Bouquet, RN Scrub Person: Hermelinda Dellen, RN; Carmela Rima  Implants: * No implants in log *  Specimens: ID Type Source Tests Collected by Time Destination  1 : bilateral sinus contents Tissue PATH ENT excision SURGICAL PATHOLOGY Scarlette Ar, MD 04/13/2023 1423   2 : Sphenoid contents Tissue PATH ENT biopsy SURGICAL PATHOLOGY Scarlette Ar, MD 04/13/2023 1345   A : Sphenoid contents Tissue PATH ENT biopsy FUNGUS CULTURE WITH STAIN, AEROBIC/ANAEROBIC CULTURE W GRAM STAIN (SURGICAL/DEEP WOUND) Scarlette Ar, MD 04/13/2023 1343   B : Sphenoid contents- cultures Body Fluid PATH ENT biopsy FUNGUS CULTURE WITH STAIN, AEROBIC/ANAEROBIC CULTURE W GRAM STAIN (SURGICAL/DEEP WOUND) Scarlette Ar, MD 04/13/2023 1341     Complications: none  EBL: 30 ML  IVF: Per anesthesia ML  Condition: stable  Operative Findings:  Right sphenoid sinus impacted with fungal debris and pus Left sphenoid sinus mucosal edema/polypoid thickening and pus  Severe right deviated nasal septum  Indications for Procedure: Rick Turner is a 72 y.o. M with a history of chronic sphenoid sinusitis recalcitrant to medical therapies confirmed with endoscopy and CT imaging. After  discussing risks, benefits, and alternatives to the procedure, the patient elected to proceed.   Informed consent: Informed consent was obtained. Risks discussed in detail including pain, bleeding, infection, injury to the eye with associated vision changes, vision loss, injury to the brain with associated CSF leak or meningitis, failure to improve or worsening of sense of smell, nasal synechiae, nasal septal perforation, need for further procedures, risks of general anesthesia. Despite these risks the patient requested to proceed with surgery.  Details of the Procedure:  The patient was identified in the pre-op area and brought back to the operating room and laid in the supine position. General anesthesia was induced and the patient was endotracheally intubated without complication. A surgical pause was then performed to identify the correct patient, procedure, and location. After all were in agreement, the head of the bed was rotated 180 degrees away from anesthesia.  The patient was prepped and draped in the standard clean fashion for endoscopic sinus surgery. The nose was decongested with 1:1000 topical epinephrine.  We began by setting up the computer aided CT stereotactic navigation. The reference array was placed about the patients forehead and calibrated to known anatomic landmarks. It was used throughout the procedure as indicated given the chronic disease process and need for dissection along the skull base and orbit.   We first began with the septoplasty portion of the procedure. The patient had a left septal deviation narrowing the nasal cavity. A left sided hemitransfixtion incision was made and dissection taken down to the septal cartilage. A left sided mucoperichondrial flap was elevated beyond the septal deflection. The cartilage was then crossed over and a right sided mucoperichondrial flap was then elevated beyond the bony and cartilaginous deviation. The deviated portions of cartilage  and bone  were removed with care taken to leave a 1.5cm dorsal and caudal strut. The hemitransfixtion incision was then closed with 5-0 fast absorbing gut and the septum quilted with 4-0 chromic.   All cottonoids were removed and set aside. The 0, 30, and 70 degree endoscopes were attached to the video monitor for endoscopic viewing as needed throughout the procedure, and our attention was directed to the right nasal cavity.   We then turned our attention to performing the right sphenoidotomy. The middle turbinate was gently lateralized with a freer. The 0-degree endoscope was used to identify the superior turbinate, and the inferior 1/3 was taken down with thru cutting instrumentation. The true sphenoid ostium was cannulated and widened with a Kerrison rongeur.  All debris was removed from the sphenoid sinus (This was grossly consistent with fungal mycetoma/chronic infection; the sinus was completely lavaged until all debris was removed, a 30 degree endoscope was used to confirm absence of residual fungal debris or pus). A suction bovie was used to cauterize bleeding from the posterior septal vessels.   We then turned our attention to performing the left sphenoidotomy. The middle turbinate was gently lateralized with a freer. The 0-degree endoscope was used to identify the superior turbinate, and the inferior 1/3 was taken down with thru cutting instrumentation. The true sphenoid ostium was cannulated and widened with a Kerrison rongeur.  All debris was removed from the sphenoid sinus (frank pus and polypoid mucosa at the ostia; the sinus was completely lavaged until all pus was removed, a 30 degree endoscope was used to confirm absence of residual fungal debris or pus). A suction bovie was used to cauterize bleeding from the posterior septal vessels.   All cottonoids were removed and hemostasis confirmed. The nasopharynx and oropharynx were suctioned free of blood and saline, Posisep by chitosan dressings  were placed in the sphenoethmoid recess. A mustache dressing was then applied. An orogastric tube was used to suction out pharyngoesophageal contents The patient was then returned to the anesthetist, who awakened and extubated the patient without incident. The patient tolerated the procedure well without complications and was transferred to the recovery room in satisfactory condition.   Mervin Kung, MD [facility]  04/13/2023

## 2023-04-14 ENCOUNTER — Encounter (HOSPITAL_COMMUNITY): Payer: Self-pay | Admitting: Otolaryngology

## 2023-04-16 LAB — AEROBIC/ANAEROBIC CULTURE W GRAM STAIN (SURGICAL/DEEP WOUND)

## 2023-04-17 ENCOUNTER — Other Ambulatory Visit: Payer: Self-pay

## 2023-04-17 LAB — AEROBIC/ANAEROBIC CULTURE W GRAM STAIN (SURGICAL/DEEP WOUND)

## 2023-04-18 LAB — SURGICAL PATHOLOGY

## 2023-04-18 LAB — AEROBIC/ANAEROBIC CULTURE W GRAM STAIN (SURGICAL/DEEP WOUND): Gram Stain: NONE SEEN

## 2023-04-19 DIAGNOSIS — J323 Chronic sphenoidal sinusitis: Secondary | ICD-10-CM | POA: Diagnosis not present

## 2023-04-19 DIAGNOSIS — B470 Eumycetoma: Secondary | ICD-10-CM | POA: Diagnosis not present

## 2023-04-19 DIAGNOSIS — Z9889 Other specified postprocedural states: Secondary | ICD-10-CM | POA: Diagnosis not present

## 2023-04-19 LAB — AEROBIC/ANAEROBIC CULTURE W GRAM STAIN (SURGICAL/DEEP WOUND): Gram Stain: NONE SEEN

## 2023-04-22 LAB — SUSCEPTIBILITY RESULT

## 2023-04-22 LAB — SUSCEPTIBILITY, AER + ANAEROB: Source of Sample: 8680

## 2023-04-26 DIAGNOSIS — G5791 Unspecified mononeuropathy of right lower limb: Secondary | ICD-10-CM | POA: Diagnosis not present

## 2023-04-28 LAB — AEROBIC/ANAEROBIC CULTURE W GRAM STAIN (SURGICAL/DEEP WOUND)

## 2023-05-01 DIAGNOSIS — R972 Elevated prostate specific antigen [PSA]: Secondary | ICD-10-CM | POA: Diagnosis not present

## 2023-05-02 DIAGNOSIS — Z9889 Other specified postprocedural states: Secondary | ICD-10-CM | POA: Diagnosis not present

## 2023-05-02 DIAGNOSIS — J323 Chronic sphenoidal sinusitis: Secondary | ICD-10-CM | POA: Diagnosis not present

## 2023-05-02 DIAGNOSIS — B470 Eumycetoma: Secondary | ICD-10-CM | POA: Diagnosis not present

## 2023-05-02 DIAGNOSIS — G43E19 Chronic migraine with aura, intractable, without status migrainosus: Secondary | ICD-10-CM | POA: Diagnosis not present

## 2023-05-04 DIAGNOSIS — R972 Elevated prostate specific antigen [PSA]: Secondary | ICD-10-CM | POA: Diagnosis not present

## 2023-05-04 DIAGNOSIS — A630 Anogenital (venereal) warts: Secondary | ICD-10-CM | POA: Diagnosis not present

## 2023-05-11 LAB — FUNGAL ORGANISM REFLEX

## 2023-05-11 LAB — FUNGUS CULTURE RESULT

## 2023-05-11 LAB — FUNGUS CULTURE WITH STAIN

## 2023-05-12 LAB — FUNGAL ORGANISM REFLEX

## 2023-05-12 LAB — FUNGUS CULTURE WITH STAIN

## 2023-05-12 LAB — FUNGUS CULTURE RESULT

## 2023-05-18 ENCOUNTER — Encounter: Payer: Self-pay | Admitting: Orthopedic Surgery

## 2023-05-19 ENCOUNTER — Telehealth: Payer: Self-pay

## 2023-05-19 NOTE — Telephone Encounter (Signed)
VOB submitted for Monovisc, left knee 

## 2023-05-23 ENCOUNTER — Ambulatory Visit (INDEPENDENT_AMBULATORY_CARE_PROVIDER_SITE_OTHER): Payer: Medicare Other | Admitting: Diagnostic Neuroimaging

## 2023-05-23 ENCOUNTER — Telehealth: Payer: Self-pay | Admitting: Diagnostic Neuroimaging

## 2023-05-23 ENCOUNTER — Encounter: Payer: Self-pay | Admitting: Diagnostic Neuroimaging

## 2023-05-23 VITALS — BP 138/82 | HR 60 | Ht 70.0 in | Wt 239.0 lb

## 2023-05-23 DIAGNOSIS — R519 Headache, unspecified: Secondary | ICD-10-CM

## 2023-05-23 MED ORDER — TOPIRAMATE 50 MG PO TABS: 50.0000 mg | ORAL_TABLET | Freq: Two times a day (BID) | ORAL | 12 refills | Status: AC

## 2023-05-23 MED ORDER — RIZATRIPTAN BENZOATE 10 MG PO TBDP: 10.0000 mg | ORAL_TABLET | ORAL | 11 refills | Status: AC | PRN

## 2023-05-23 NOTE — Progress Notes (Signed)
[facility]  PATIENT: Rick Turner DOB: September 03, 1951  REFERRING CLINICIAN: Scarlette Ar, MD HISTORY FROM: patient  REASON FOR VISIT: new consult    HISTORICAL  CHIEF COMPLAINT:  Chief Complaint  Patient presents with   New Problem     RM 7, here alone Pt is here for migraines. Pt started having migraines after contracting covid in 08/2022. Pt states he has continuous headaches everyday. Pt states when having a migraine he has nausea, blurry vision, sensitivity to light and noise. States headaches are worse in the morning, in the evening they seem to get worse again.     HISTORY OF PRESENT ILLNESS:   UPDATE (05/22/22, VRP): Since last visit, muscle weakness better. Had some nerve blocks and knee injections, and also doing exercises.  Here for new issue. Had covid in Oct 2023. Then had some issues with sinus headaches. Went to ENT and then had sinus surgery. Still with daily HA, with sens to light and sound, and visual aura.  PRIOR HPI ([id number]): 72 year old male here for evaluation of muscle weakness.  2017 patient was having difficulty getting up off the floor during exercise.  He developed burning station in the quads and hips.  Also having shortness of breath and dyspnea on exertion.  Nowadays having more troubles going up and down stairs or slopes on the grass.  Patient has been sad since 2005.  When he developed muscle weakness these were switched around to see if this would reduce muscle weakness.  He also tried alternate lipid-lowering injectable therapies but this did not seem to help.  Patient continues to have burning and cramps sensation in his feet especially when he wakes up.    REVIEW OF SYSTEMS: Full 14 system review of systems performed and negative with exception of: as per HPI.  ALLERGIES: Allergies  Allergen Reactions   Chlorthalidone     Worse hyponatremia   Evolocumab     leg wekness   Hydrocod Poli-Chlorphe Poli Er Itching     Tolerate with benadryl    Hydrocodone-Acetaminophen Itching    Tolerate with benadryl    Statins Other (See Comments)    Pt reports muscle weakness when taking statins    HOME MEDICATIONS: Outpatient Medications Prior to Visit  Medication Sig Dispense Refill   acetaminophen (TYLENOL) 500 MG tablet Take 2 tablets (1,000 mg total) by mouth every 6 (six) hours. (Patient taking differently: Take 1,000 mg by mouth every 8 (eight) hours as needed for moderate pain.) 50 tablet 0   amLODipine (NORVASC) 5 MG tablet Take 5 mg by mouth 2 (two) times daily.     ARTIFICIAL TEAR SOLUTION OP Place 1 drop into both eyes 2 (two) times daily.     aspirin EC 81 MG tablet Take 81 mg by mouth daily.     carvedilol (COREG) 25 MG tablet Take 25 mg by mouth 2 (two) times daily.     Cholecalciferol (VITAMIN D3) 50 MCG (2000 UT) capsule Take 2,000 Units by mouth daily.     Cyanocobalamin (VITAMIN B12) 1000 MCG TBCR Take 1,000 mcg by mouth daily.     diclofenac sodium (VOLTAREN) 1 % GEL APPLY TOPICALLY TO AFFECTED AREA ONCE DAILY AS NEEDED FOR PAIN 2 g 1   fexofenadine (ALLEGRA) 180 MG tablet Take 180 mg by mouth daily.      fluticasone (FLONASE) 50 MCG/ACT nasal spray Place 2 sprays into both nostrils 2 (two) times daily.     furosemide (LASIX) 20 MG tablet Take 1  tablet (20 mg total) by mouth daily as needed. 30 tablet 3   gabapentin (NEURONTIN) 300 MG capsule Take 1 capsule by mouth 2 (two) times daily.     ibuprofen (ADVIL) 200 MG tablet Take 400 mg by mouth every 6 (six) hours as needed for moderate pain.     levothyroxine (SYNTHROID) 125 MCG tablet Take 125 mcg by mouth every morning.     lidocaine 4 % Place 1-2 patches onto the skin daily as needed (pain).     Multiple Vitamin (MULTI VITAMIN) TABS Take 1 tablet by mouth daily.     omeprazole (PRILOSEC) 20 MG capsule Take 20 mg by mouth daily.     PRALUENT 75 MG/ML SOAJ Inject 75 mg/mL as directed every 14 (fourteen) days.     tadalafil (CIALIS) 10 MG tablet  Take 10 mg by mouth See admin instructions. Take 10 every 3 days as needed     tadalafil (CIALIS) 5 MG tablet Take 5 mg by mouth daily.     tamsulosin (FLOMAX) 0.4 MG CAPS capsule Take 0.4 mg by mouth daily.     telmisartan (MICARDIS) 80 MG tablet Take 80 mg by mouth daily.     No facility-administered medications prior to visit.    PAST MEDICAL HISTORY: Past Medical History:  Diagnosis Date   Acne rosacea    Arthritis    BPH (benign prostatic hypertrophy)    Clavicle fracture    left   Detached retina    left    Detached retina, left 2011   Fx ankle    left   GERD (gastroesophageal reflux disease)    Headache    Hyperlipidemia    Hypertension    Hypothyroidism    OSA (obstructive sleep apnea)    mild   Pre-diabetes    Seasonal allergies    Sleep apnea    Weakness of both lower extremities     PAST SURGICAL HISTORY: Past Surgical History:  Procedure Laterality Date   CATARACT EXTRACTION  08/2011   left, right 2016   detached retiina  2011   left, 2014   EYE SURGERY     lasix   HERNIA REPAIR Right    ing   SEPTOPLASTY N/A 04/13/2023   Procedure: SEPTOPLASTY;  Surgeon: Scarlette Ar, MD;  Location: [facility] OR;  Service: ENT;  Laterality: N/A;   SINUS ENDO WITH FUSION Bilateral 04/13/2023   Procedure: FUNCTIONAL ENDOSCOPIC SINUS SURGERY; SPHENOIDOTOMY AND TISSUE REMOVAL WITH FUSION;  Surgeon: Scarlette Ar, MD;  Location: [facility] OR;  Service: ENT;  Laterality: Bilateral;   TONSILLECTOMY     TOTAL SHOULDER REPLACEMENT Left 2021   TOTAL SHOULDER REPLACEMENT Right 09/2022    FAMILY HISTORY: Family History  Problem Relation Age of Onset   CAD Mother    CAD Father    Breast cancer Sister    Hypertension Brother    Lung cancer Brother    Hypertension Brother    Parkinson's disease Brother     SOCIAL HISTORY: Social History   Socioeconomic History   Marital status: Married    Spouse name: Not on file   Number of children: 3   Years of education: PhD   Highest  education level: Professional school degree (e.g., MD, DDS, DVM, JD)  Occupational History   Not on file  Tobacco Use   Smoking status: Never   Smokeless tobacco: Never  Vaping Use   Vaping status: Never Used  Substance and Sexual Activity   Alcohol use: Not Currently  Alcohol/week: 2.0 standard drinks of alcohol    Types: 2 Glasses of wine per week    Comment: 2- 3 daily   Drug use: Never   Sexual activity: Not on file  Other Topics Concern   Not on file  Social History Narrative   Lives with wife, retired    Caffeine 2-3 c day   Social Determinants of Health   Financial Resource Strain: Not on file  Food Insecurity: Low Risk  (05/02/2023)   Received from [facility]   Food vital sign    Within the past 12 months, you worried that your food would run out before you got money to buy more: Never true    Within the past 12 months, the food you bought just didn't last and you didn't have money to get more. : Never true  Transportation Needs: Not on file (05/02/2023)  Physical Activity: Not on file  Stress: Not on file  Social Connections: Not on file  Intimate Partner Violence: Not on file     PHYSICAL EXAM  GENERAL EXAM/CONSTITUTIONAL: Vitals:  Vitals:   05/23/23 1516  BP: 138/82  Pulse: 60  Weight: 239 lb (108.4 kg)  Height: 5\' 10"  (1.778 m)   Body mass index is 34.29 kg/m. Wt Readings from Last 3 Encounters:  05/23/23 239 lb (108.4 kg)  04/13/23 242 lb 12.8 oz (110.1 kg)  04/11/23 242 lb 12.8 oz (110.1 kg)   Patient is in no distress; well developed, nourished and groomed; neck is supple  CARDIOVASCULAR: Examination of carotid arteries is normal; no carotid bruits Regular rate and rhythm, no murmurs Examination of peripheral vascular system by observation and palpation is normal  EYES: Ophthalmoscopic exam of optic discs and posterior segments is normal; no papilledema or hemorrhages No results found.  MUSCULOSKELETAL: Gait, strength, tone,  movements noted in Neurologic exam below  NEUROLOGIC: MENTAL STATUS:      No data to display         awake, alert, oriented to person, place and time recent and remote memory intact normal attention and concentration language fluent, comprehension intact, naming intact fund of knowledge appropriate  CRANIAL NERVE:  2nd - no papilledema on fundoscopic exam 2nd, 3rd, 4th, 6th - pupils equal and reactive to light, visual fields full to confrontation, extraocular muscles intact, no nystagmus 5th - facial sensation symmetric 7th - facial strength symmetric 8th - hearing intact 9th - palate elevates symmetrically, uvula midline 11th - shoulder shrug symmetric 12th - tongue protrusion midline  MOTOR:  normal bulk and tone, full strength in the BUE, BLE  SENSORY:  normal and symmetric to light touch DECR PP, TEMP AND VIB IN FEET  COORDINATION:  finger-nose-finger, fine finger movements normal  REFLEXES:  deep tendon reflexes 1+ and symmetric; EXCEPT TRACE IN LEFT ANKLE  GAIT/STATION:  narrow based gait     DIAGNOSTIC DATA (LABS, IMAGING, TESTING) - I reviewed patient records, labs, notes, testing and imaging myself where available.  Lab Results  Component Value Date   WBC 3.2 (L) 04/11/2023   HGB 12.3 (L) 04/11/2023   HCT 36.4 (L) 04/11/2023   MCV 103.1 (H) 04/11/2023   PLT 257 04/11/2023      Component Value Date/Time   NA 137 04/11/2023 0825   NA 136 02/08/2023 0859   K 4.4 04/11/2023 0825   CL 103 04/11/2023 0825   CO2 24 04/11/2023 0825   GLUCOSE 111 (H) 04/11/2023 0825   BUN 18 04/11/2023 0825   BUN 20  02/08/2023 0859   CREATININE 1.26 (H) 04/11/2023 0825   CALCIUM 9.0 04/11/2023 0825   PROT 6.6 04/11/2023 0825   PROT 7.1 11/24/2021 0846   ALBUMIN 4.1 04/11/2023 0825   ALBUMIN 4.6 11/24/2021 0846   AST 23 04/11/2023 0825   ALT 18 04/11/2023 0825   ALKPHOS 60 04/11/2023 0825   BILITOT 0.6 04/11/2023 0825   BILITOT 0.3 11/24/2021 0846   GFRNONAA  >60 04/11/2023 0825   GFRAA >60 02/10/2019 0229   Lab Results  Component Value Date   CHOL 243 (H) 11/24/2021   HDL 68 11/24/2021   LDLCALC 163 (H) 11/24/2021   TRIG 69 11/24/2021   CHOLHDL 3.6 11/24/2021   Lab Results  Component Value Date   HGBA1C 5.4 05/13/2021   Lab Results  Component Value Date   VITAMINB12 698 05/13/2021   Lab Results  Component Value Date   TSH 1.020 05/13/2021    05/13/21 EMG/NCS - Normal study.  No electrodiagnostic evidence of large fiber neuropathy or myopathy at this time.   05/26/21 MRI of the brain with and without contrast shows the following: 1.   Few small T2/FLAIR hyperintense foci in the subcortical and deep white matter of the hemispheres.  This is most consistent with minimal chronic microvascular ischemic changes, normal for age. 2.   Venous angioma in the left hemisphere, unchanged compared to the CT scan from 08/04/2010.. 3.   No acute findings.  05/26/21 MRI of the cervical spine with and without contrast shows the following: 1.   The spinal cord appears normal. 2.   At C4-C5, there is mild spinal stenosis due to minimal retrolisthesis and other degenerative changes.  There is moderate right foraminal narrowing and mild left foraminal narrowing.  There does not appear to be nerve root compression. 3.   At C5-C6, there is borderline spinal stenosis due to degenerative changes.  There is mild to moderate left foraminal narrowing but no nerve root compression. 4.   At C6-C7, there are degenerative changes and endplate degenerative changes but no nerve root compression or spinal stenosis. 5.   Normal enhancement pattern.  05/29/21 MRI thoracic - normal MRI of the thoracic spine with and without contrast.    ASSESSMENT AND PLAN  72 y.o. year old male here with:  Dx:  1. New onset of headaches after age 26       PLAN:  NEW ONSET HEADACHES (s/p sphenoid sinus surgery, drainage, aspergillosis on pathology, but no growth, in June  2024) - check MRI brain to eval for ongoing severe headaches   MIGRAINE PREVENTION  LIFESTYLE CHANGES -Stop or avoid smoking -Decrease or avoid caffeine / alcohol -Eat and sleep on a regular schedule -Exercise several times per week - start topiramate 50mg  at bedtime; after 1-2 weeks increase to 50mg  twice a day; drink plenty of water  Consider 2nd line - rimegepant (Nurtec) 75mg  every other day - atogepant (Qulipta) 60mg  daily - erenumab (Aimovig) 70mg  monthly (may increase to 140mg  monthly) - fremanezumab (Ajovy) 225mg  monthly (or 675mg  every 3 months) - galazanezumab (Emgality) 240mg  loading dose; then 120mg  monthly   MIGRAINE RESCUE  - ibuprofen, tylenol as needed - rizatriptan (Maxalt) 10mg  as needed for breakthrough headache; may repeat x 1 after 2 hours; max 2 tabs per day or 8 per month - rimegepant (Nurtec) 75mg  as needed for breakthrough headache; max 8 per month  Meds ordered this encounter  Medications   topiramate (TOPAMAX) 50 MG tablet    Sig: Take 1  tablet (50 mg total) by mouth 2 (two) times daily.    Dispense:  60 tablet    Refill:  12   rizatriptan (MAXALT-MLT) 10 MG disintegrating tablet    Sig: Take 1 tablet (10 mg total) by mouth as needed for migraine. May repeat in 2 hours if needed    Dispense:  9 tablet    Refill:  11   Orders Placed This Encounter  Procedures   MR BRAIN W WO CONTRAST   Return in about 3 months (around 08/23/2023) for MyChart visit (15 min).    Suanne Marker, MD 05/23/2023, 4:28 PM Certified in Neurology, Neurophysiology and Neuroimaging  [facility] [address], [address] [place], [place] [address] [phone]

## 2023-05-23 NOTE — Patient Instructions (Addendum)
  NEW ONSET HEADACHES (s/p sphenoid sinus surgery, drainage, aspergillosis on pathology, but no growth, in June 2024) - check MRI brain to eval for ongoing severe headaches  MIGRAINE PREVENTION  LIFESTYLE CHANGES -Stop or avoid smoking -Decrease or avoid caffeine / alcohol -Eat and sleep on a regular schedule -Exercise several times per week - start topiramate 50mg  at bedtime; after 1-2 weeks increase to 50mg  twice a day; drink plenty of water  MIGRAINE RESCUE  - ibuprofen, tylenol as needed - rizatriptan (Maxalt) 10mg  as needed for breakthrough headache; may repeat x 1 after 2 hours; max 2 tabs per day or 8 per month - rimegepant (Nurtec) 75mg  as needed for breakthrough headache; max 8 per month

## 2023-05-23 NOTE — Telephone Encounter (Signed)
 medicare/BCBS sup NPR sent to GI [phone] 

## 2023-05-25 ENCOUNTER — Encounter: Payer: Self-pay | Admitting: Diagnostic Neuroimaging

## 2023-05-25 DIAGNOSIS — R972 Elevated prostate specific antigen [PSA]: Secondary | ICD-10-CM | POA: Diagnosis not present

## 2023-05-25 MED ORDER — ALPRAZOLAM 0.5 MG PO TABS: 0.5000 mg | ORAL_TABLET | Freq: Once | ORAL | 0 refills | Status: AC | PRN

## 2023-05-26 ENCOUNTER — Other Ambulatory Visit: Payer: Self-pay

## 2023-05-26 DIAGNOSIS — M1712 Unilateral primary osteoarthritis, left knee: Secondary | ICD-10-CM

## 2023-05-29 ENCOUNTER — Ambulatory Visit
Admission: RE | Admit: 2023-05-29 | Discharge: 2023-05-29 | Disposition: A | Discharge status: Home / Self Care | Payer: Medicare Other | Source: Ambulatory Visit | Attending: Diagnostic Neuroimaging | Admitting: Diagnostic Neuroimaging

## 2023-05-29 DIAGNOSIS — R519 Headache, unspecified: Secondary | ICD-10-CM

## 2023-05-29 MED ORDER — GADOPICLENOL 0.5 MMOL/ML IV SOLN
10.0000 mL | Freq: Once | INTRAVENOUS | Status: AC | PRN
  Administered 2023-05-29: 10 mL via INTRAVENOUS

## 2023-06-02 ENCOUNTER — Encounter: Payer: Self-pay | Admitting: Orthopedic Surgery

## 2023-06-02 ENCOUNTER — Ambulatory Visit (INDEPENDENT_AMBULATORY_CARE_PROVIDER_SITE_OTHER): Payer: Medicare Other | Admitting: Orthopedic Surgery

## 2023-06-02 ENCOUNTER — Telehealth: Payer: Self-pay

## 2023-06-02 ENCOUNTER — Encounter: Payer: Self-pay | Admitting: Diagnostic Neuroimaging

## 2023-06-02 DIAGNOSIS — M1712 Unilateral primary osteoarthritis, left knee: Secondary | ICD-10-CM | POA: Diagnosis not present

## 2023-06-02 MED ORDER — HYALURONAN 88 MG/4ML IX SOSY
88.0000 mg | PREFILLED_SYRINGE | INTRA_ARTICULAR | Status: AC | PRN
Start: 2023-06-02 — End: 2023-06-02
  Administered 2023-06-02: 88 mg via INTRA_ARTICULAR

## 2023-06-02 MED ORDER — LIDOCAINE HCL 1 % IJ SOLN
5.0000 mL | INTRAMUSCULAR | Status: AC | PRN
Start: 2023-06-02 — End: 2023-06-02
  Administered 2023-06-02: 5 mL

## 2023-06-02 NOTE — Progress Notes (Signed)
   Procedure Note  Patient: Linzy Ladehoff             Date of Birth: February 11, 1951           MRN: [id number]             Visit Date: 06/02/2023  Procedures: Visit Diagnoses:  1. Arthritis of left knee     Large Joint Inj: L knee on 06/02/2023 6:39 PM Indications: pain, joint swelling and diagnostic evaluation Details: 18 G 1.5 in needle, superolateral approach  Arthrogram: No  Medications: 5 mL lidocaine 1 %; 88 mg Hyaluronan 88 MG/4ML Outcome: tolerated well, no immediate complications Procedure, treatment alternatives, risks and benefits explained, specific risks discussed. Consent was given by the patient. Immediately prior to procedure a time out was called to verify the correct patient, procedure, equipment, support staff and site/side marked as required. Patient was prepped and draped in the usual sterile fashion.     Lot [id number]

## 2023-06-02 NOTE — Telephone Encounter (Signed)
Auth needed for left knee gel in 6 mo

## 2023-06-04 NOTE — Progress Notes (Signed)
Kindly inform the patient that MRI scan of the brain shows mild age-related changes of hardening of the arteries.  No new acute or worrisome findings

## 2023-06-05 ENCOUNTER — Telehealth: Payer: Self-pay | Admitting: Anesthesiology

## 2023-06-05 NOTE — Telephone Encounter (Signed)
-----   Message from Delia Heady sent at 06/04/2023  4:18 PM EDT ----- Joneen Roach inform the patient that MRI scan of the brain shows mild age-related changes of hardening of the arteries.  No new acute or worrisome findings

## 2023-06-06 DIAGNOSIS — Z9889 Other specified postprocedural states: Secondary | ICD-10-CM | POA: Diagnosis not present

## 2023-06-06 DIAGNOSIS — G43E19 Chronic migraine with aura, intractable, without status migrainosus: Secondary | ICD-10-CM | POA: Diagnosis not present

## 2023-06-06 DIAGNOSIS — B470 Eumycetoma: Secondary | ICD-10-CM | POA: Diagnosis not present

## 2023-06-06 DIAGNOSIS — J323 Chronic sphenoidal sinusitis: Secondary | ICD-10-CM | POA: Diagnosis not present

## 2023-06-06 NOTE — Telephone Encounter (Signed)
Noted. Next available gel injection would need to be after 12/03/2023 Will submit in January, 2025 for Monovisc, left knee

## 2023-07-25 DIAGNOSIS — Z23 Encounter for immunization: Secondary | ICD-10-CM | POA: Diagnosis not present

## 2023-08-15 DIAGNOSIS — I2510 Atherosclerotic heart disease of native coronary artery without angina pectoris: Secondary | ICD-10-CM | POA: Diagnosis not present

## 2023-08-15 DIAGNOSIS — R972 Elevated prostate specific antigen [PSA]: Secondary | ICD-10-CM | POA: Diagnosis not present

## 2023-08-15 DIAGNOSIS — G4733 Obstructive sleep apnea (adult) (pediatric): Secondary | ICD-10-CM | POA: Diagnosis not present

## 2023-08-15 DIAGNOSIS — R7301 Impaired fasting glucose: Secondary | ICD-10-CM | POA: Diagnosis not present

## 2023-08-15 DIAGNOSIS — E039 Hypothyroidism, unspecified: Secondary | ICD-10-CM | POA: Diagnosis not present

## 2023-08-15 DIAGNOSIS — I10 Essential (primary) hypertension: Secondary | ICD-10-CM | POA: Diagnosis not present

## 2023-08-15 DIAGNOSIS — E7800 Pure hypercholesterolemia, unspecified: Secondary | ICD-10-CM | POA: Diagnosis not present

## 2023-08-15 DIAGNOSIS — R519 Headache, unspecified: Secondary | ICD-10-CM | POA: Diagnosis not present

## 2023-08-15 DIAGNOSIS — J329 Chronic sinusitis, unspecified: Secondary | ICD-10-CM | POA: Diagnosis not present

## 2023-08-29 ENCOUNTER — Telehealth (INDEPENDENT_AMBULATORY_CARE_PROVIDER_SITE_OTHER): Payer: Medicare Other | Admitting: Diagnostic Neuroimaging

## 2023-08-29 ENCOUNTER — Encounter: Payer: Self-pay | Admitting: Diagnostic Neuroimaging

## 2023-08-29 DIAGNOSIS — G43009 Migraine without aura, not intractable, without status migrainosus: Secondary | ICD-10-CM

## 2023-08-29 MED ORDER — AIMOVIG 70 MG/ML ~~LOC~~ SOAJ: 70.0000 mg | SUBCUTANEOUS | 4 refills | Status: DC

## 2023-08-29 NOTE — Progress Notes (Addendum)
[facility]  PATIENT: Rick Turner DOB: [birth year]  REFERRING CLINICIAN: Emilio Aspen, * HISTORY FROM: patient  REASON FOR VISIT: new consult    HISTORICAL  CHIEF COMPLAINT:  Chief Complaint  Patient presents with   Migraine   Memory Loss    HISTORY OF PRESENT ILLNESS:   UPDATE (08/29/23, VRP): Since last visit, doing better with HA intensity. Still daily HA. TPX seems to help. Groin pain is better. Had 15lb weight loss. Some brain fog.   UPDATE (05/23/23, VRP): Since last visit, muscle weakness better. Had some nerve blocks and knee injections, and also doing exercises.  Here for new issue. Had covid in Oct 2023. Then had some issues with sinus headaches. Went to ENT and then had sinus surgery. Still with daily HA, with sens to light and sound, and visual aura.  PRIOR HPI ([id number]): 72 year old male here for evaluation of muscle weakness.  2017 patient was having difficulty getting up off the floor during exercise.  He developed burning station in the quads and hips.  Also having shortness of breath and dyspnea on exertion.  Nowadays having more troubles going up and down stairs or slopes on the grass.  Patient has been sad since 2005.  When he developed muscle weakness these were switched around to see if this would reduce muscle weakness.  He also tried alternate lipid-lowering injectable therapies but this did not seem to help.  Patient continues to have burning and cramps sensation in his feet especially when he wakes up.    REVIEW OF SYSTEMS: Full 14 system review of systems performed and negative with exception of: as per HPI.  ALLERGIES: Allergies  Allergen Reactions   Chlorthalidone     Worse hyponatremia   Evolocumab     leg weakness   Hydrocod Poli-Chlorphe Poli Er Itching    Tolerate with benadryl    Hydrocodone-Acetaminophen Itching    Tolerate with benadryl    Statins Other (See Comments)    Pt reports muscle weakness when  taking statins    HOME MEDICATIONS: Outpatient Medications Prior to Visit  Medication Sig Dispense Refill   acetaminophen (TYLENOL) 500 MG tablet Take 2 tablets (1,000 mg total) by mouth every 6 (six) hours. (Patient taking differently: Take 1,000 mg by mouth every 8 (eight) hours as needed for moderate pain.) 50 tablet 0   ALPRAZolam (XANAX) 0.5 MG tablet Take 1 tablet (0.5 mg total) by mouth once as needed for up to 1 dose for anxiety (take 1 tablet 30 min prior to procedure, can take a tablet before procedure. Must have a driver.). 2 tablet 0   amLODipine (NORVASC) 5 MG tablet Take 5 mg by mouth 2 (two) times daily.     ARTIFICIAL TEAR SOLUTION OP Place 1 drop into both eyes 2 (two) times daily.     aspirin EC 81 MG tablet Take 81 mg by mouth daily.     carvedilol (COREG) 25 MG tablet Take 25 mg by mouth 2 (two) times daily.     Cholecalciferol (VITAMIN D3) 50 MCG (2000 UT) capsule Take 2,000 Units by mouth daily.     Cyanocobalamin (VITAMIN B12) 1000 MCG TBCR Take 1,000 mcg by mouth daily.     diclofenac sodium (VOLTAREN) 1 % GEL APPLY TOPICALLY TO AFFECTED AREA ONCE DAILY AS NEEDED FOR PAIN 2 g 1   fexofenadine (ALLEGRA) 180 MG tablet Take 180 mg by mouth daily.      fluticasone (FLONASE) 50 MCG/ACT nasal spray Place 2  sprays into both nostrils 2 (two) times daily.     furosemide (LASIX) 20 MG tablet Take 1 tablet (20 mg total) by mouth daily as needed. 30 tablet 3   gabapentin (NEURONTIN) 300 MG capsule Take 1 capsule by mouth 2 (two) times daily.     ibuprofen (ADVIL) 200 MG tablet Take 400 mg by mouth every 6 (six) hours as needed for moderate pain.     levothyroxine (SYNTHROID) 125 MCG tablet Take 125 mcg by mouth every morning.     lidocaine 4 % Place 1-2 patches onto the skin daily as needed (pain).     Multiple Vitamin (MULTI VITAMIN) TABS Take 1 tablet by mouth daily.     omeprazole (PRILOSEC) 20 MG capsule Take 20 mg by mouth daily.     PRALUENT 75 MG/ML SOAJ Inject 75 mg/mL as  directed every 14 (fourteen) days.     rizatriptan (MAXALT-MLT) 10 MG disintegrating tablet Take 1 tablet (10 mg total) by mouth as needed for migraine. May repeat in 2 hours if needed 9 tablet 11   tadalafil (CIALIS) 10 MG tablet Take 10 mg by mouth See admin instructions. Take 10 every 3 days as needed     tadalafil (CIALIS) 5 MG tablet Take 5 mg by mouth daily.     tamsulosin (FLOMAX) 0.4 MG CAPS capsule Take 0.4 mg by mouth daily.     telmisartan (MICARDIS) 80 MG tablet Take 80 mg by mouth daily.     topiramate (TOPAMAX) 50 MG tablet Take 1 tablet (50 mg total) by mouth 2 (two) times daily. 60 tablet 12   No facility-administered medications prior to visit.    PAST MEDICAL HISTORY: Past Medical History:  Diagnosis Date   Acne rosacea    Arthritis    BPH (benign prostatic hypertrophy)    Clavicle fracture    left   Detached retina    left    Detached retina, left 2011   Fx ankle    left   GERD (gastroesophageal reflux disease)    Headache    Hyperlipidemia    Hypertension    Hypothyroidism    OSA (obstructive sleep apnea)    mild   Pre-diabetes    Seasonal allergies    Sleep apnea    Weakness of both lower extremities     PAST SURGICAL HISTORY: Past Surgical History:  Procedure Laterality Date   CATARACT EXTRACTION  08/2011   left, right 2016   detached retiina  2011   left, 2014   EYE SURGERY     lasix   HERNIA REPAIR Right    ing   SEPTOPLASTY N/A 04/13/2023   Procedure: SEPTOPLASTY;  Surgeon: Scarlette Ar, MD;  Location: [facility] OR;  Service: ENT;  Laterality: N/A;   SINUS ENDO WITH FUSION Bilateral 04/13/2023   Procedure: FUNCTIONAL ENDOSCOPIC SINUS SURGERY; SPHENOIDOTOMY AND TISSUE REMOVAL WITH FUSION;  Surgeon: Scarlette Ar, MD;  Location: [facility] OR;  Service: ENT;  Laterality: Bilateral;   TONSILLECTOMY     TOTAL SHOULDER REPLACEMENT Left 2021   TOTAL SHOULDER REPLACEMENT Right 09/2022    FAMILY HISTORY: Family History  Problem Relation Age of Onset   CAD  Mother    CAD Father    Breast cancer Sister    Hypertension Brother    Lung cancer Brother    Hypertension Brother    Parkinson's disease Brother     SOCIAL HISTORY: Social History   Socioeconomic History   Marital status: Married    Spouse  name: Not on file   Number of children: 3   Years of education: PhD   Highest education level: Professional school degree (e.g., MD, DDS, DVM, JD)  Occupational History   Not on file  Tobacco Use   Smoking status: Never   Smokeless tobacco: Never  Vaping Use   Vaping status: Never Used  Substance and Sexual Activity   Alcohol use: Not Currently    Alcohol/week: 2.0 standard drinks of alcohol    Types: 2 Glasses of wine per week    Comment: 2- 3 daily   Drug use: Never   Sexual activity: Not on file  Other Topics Concern   Not on file  Social History Narrative   Lives with wife, retired    Caffeine 2-3 c day   Social Determinants of Health   Financial Resource Strain: Not on file  Food Insecurity: Low Risk  (05/02/2023)   Received from [facility]   Hunger Vital Sign    Worried About Running Out of Food in the Last Year: Never true    Ran Out of Food in the Last Year: Never true  Transportation Needs: Not on file (05/02/2023)  Physical Activity: Not on file  Stress: Not on file  Social Connections: Not on file  Intimate Partner Violence: Not on file     PHYSICAL EXAM  GENERAL EXAM/CONSTITUTIONAL: Vitals:  There were no vitals filed for this visit.  There is no height or weight on file to calculate BMI. Wt Readings from Last 3 Encounters:  05/23/23 239 lb (108.4 kg)  04/13/23 242 lb 12.8 oz (110.1 kg)  04/11/23 242 lb 12.8 oz (110.1 kg)   Patient is in no distress; well developed, nourished and groomed; neck is supple  CARDIOVASCULAR: Examination of carotid arteries is normal; no carotid bruits Regular rate and rhythm, no murmurs Examination of peripheral vascular system by observation and palpation is  normal  EYES: Ophthalmoscopic exam of optic discs and posterior segments is normal; no papilledema or hemorrhages No results found.  MUSCULOSKELETAL: Gait, strength, tone, movements noted in Neurologic exam below  NEUROLOGIC: MENTAL STATUS:      No data to display         awake, alert, oriented to person, place and time recent and remote memory intact normal attention and concentration language fluent, comprehension intact, naming intact fund of knowledge appropriate  CRANIAL NERVE:  2nd - no papilledema on fundoscopic exam 2nd, 3rd, 4th, 6th - pupils equal and reactive to light, visual fields full to confrontation, extraocular muscles intact, no nystagmus 5th - facial sensation symmetric 7th - facial strength symmetric 8th - hearing intact 9th - palate elevates symmetrically, uvula midline 11th - shoulder shrug symmetric 12th - tongue protrusion midline  MOTOR:  normal bulk and tone, full strength in the BUE, BLE  SENSORY:  normal and symmetric to light touch DECR PP, TEMP AND VIB IN FEET  COORDINATION:  finger-nose-finger, fine finger movements normal  REFLEXES:  deep tendon reflexes 1+ and symmetric; EXCEPT TRACE IN LEFT ANKLE  GAIT/STATION:  narrow based gait     DIAGNOSTIC DATA (LABS, IMAGING, TESTING) - I reviewed patient records, labs, notes, testing and imaging myself where available.  Lab Results  Component Value Date   WBC 3.2 (L) 04/11/2023   HGB 12.3 (L) 04/11/2023   HCT 36.4 (L) 04/11/2023   MCV 103.1 (H) 04/11/2023   PLT 257 04/11/2023      Component Value Date/Time   NA 137 04/11/2023 0825  NA 136 02/08/2023 0859   K 4.4 04/11/2023 0825   CL 103 04/11/2023 0825   CO2 24 04/11/2023 0825   GLUCOSE 111 (H) 04/11/2023 0825   BUN 18 04/11/2023 0825   BUN 20 02/08/2023 0859   CREATININE 1.26 (H) 04/11/2023 0825   CALCIUM 9.0 04/11/2023 0825   PROT 6.6 04/11/2023 0825   PROT 7.1 11/24/2021 0846   ALBUMIN 4.1 04/11/2023 0825    ALBUMIN 4.6 11/24/2021 0846   AST 23 04/11/2023 0825   ALT 18 04/11/2023 0825   ALKPHOS 60 04/11/2023 0825   BILITOT 0.6 04/11/2023 0825   BILITOT 0.3 11/24/2021 0846   GFRNONAA >60 04/11/2023 0825   GFRAA >60 02/10/2019 0229   Lab Results  Component Value Date   CHOL 243 (H) 11/24/2021   HDL 68 11/24/2021   LDLCALC 163 (H) 11/24/2021   TRIG 69 11/24/2021   CHOLHDL 3.6 11/24/2021   Lab Results  Component Value Date   HGBA1C 5.4 05/13/2021   Lab Results  Component Value Date   VITAMINB12 698 05/13/2021   Lab Results  Component Value Date   TSH 1.020 05/13/2021    05/13/21 EMG/NCS - Normal study.  No electrodiagnostic evidence of large fiber neuropathy or myopathy at this time.   05/26/21 MRI of the brain with and without contrast shows the following: 1.   Few small T2/FLAIR hyperintense foci in the subcortical and deep white matter of the hemispheres.  This is most consistent with minimal chronic microvascular ischemic changes, normal for age. 2.   Venous angioma in the left hemisphere, unchanged compared to the CT scan from 08/04/2010.. 3.   No acute findings.  05/26/21 MRI of the cervical spine with and without contrast shows the following: 1.   The spinal cord appears normal. 2.   At C4-C5, there is mild spinal stenosis due to minimal retrolisthesis and other degenerative changes.  There is moderate right foraminal narrowing and mild left foraminal narrowing.  There does not appear to be nerve root compression. 3.   At C5-C6, there is borderline spinal stenosis due to degenerative changes.  There is mild to moderate left foraminal narrowing but no nerve root compression. 4.   At C6-C7, there are degenerative changes and endplate degenerative changes but no nerve root compression or spinal stenosis. 5.   Normal enhancement pattern.  05/29/21 MRI thoracic - normal MRI of the thoracic spine with and without contrast.   05/29/23 MRI of the brain with and without contrast shows  the following: No acute findings.  Normal enhancement pattern. Few punctate T2/FLAIR hypertense foci in the cerebral hemispheres, predominantly in the subcortical and deep white matter.  None of these appear to be acute.  They do not enhance.  Compared to the MRI from 05/26/2021, none of the foci appear to be new.  They are most consistent with minimal chronic microvascular ischemic change or sequela of migraine headache.  ASSESSMENT AND PLAN  72 y.o. year old male here with:  Dx:  1. Migraine without aura and without status migrainosus, not intractable      PLAN:   MIGRAINE WITHOUT AURA PREVENTION  LIFESTYLE CHANGES -Stop or avoid smoking -Decrease or avoid caffeine / alcohol -Eat and sleep on a regular schedule; continue CPAP for OSA -Exercise several times per week - continue topiramate 50mg  twice a day; may reduce to 50mg  at bedtime to improve brain fog - start erenumab (Aimovig) 70mg  monthly (may increase to 140mg  monthly)  MIGRAINE RESCUE  - ibuprofen,  tylenol as needed - rizatriptan (Maxalt) 10mg  as needed for breakthrough headache; may repeat x 1 after 2 hours; max 2 tabs per day or 8 per month   Meds ordered this encounter  Medications   Erenumab-aooe (AIMOVIG) 70 MG/ML SOAJ    Sig: Inject 70 mg into the skin every 30 (thirty) days.    Dispense:  3 mL    Refill:  4   Return in about 6 months (around 02/27/2024) for MyChart visit (15 min).  Virtual Visit via Video Note  I connected with Rick Turner on 08/29/23 at  3:00 PM EDT by a video enabled telemedicine application and verified that I am speaking with the correct person using two identifiers.   I discussed the limitations of evaluation and management by telemedicine and the availability of in person appointments. The patient expressed understanding and agreed to proceed.  Patient is at home and I am at the office.   I spent 15 minutes of face-to-face and non-face-to-face time with patient.  This included  previsit chart review, lab review, study review, order entry, electronic health record documentation, patient education.      Suanne Marker, MD 08/29/2023, 2:56 PM Certified in Neurology, Neurophysiology and Neuroimaging  [facility] [address] [place], [place] [address] [phone]

## 2023-09-10 ENCOUNTER — Encounter: Payer: Self-pay | Admitting: Diagnostic Neuroimaging

## 2023-09-12 ENCOUNTER — Telehealth: Payer: Self-pay

## 2023-09-12 ENCOUNTER — Other Ambulatory Visit (HOSPITAL_COMMUNITY): Payer: Self-pay

## 2023-09-12 NOTE — Telephone Encounter (Signed)
   I received a request via patient advise request-when trying to submit it states there was a previous PA that was denied-I do not see documentation in epic about this med or a PA having been attempted. I received a form requesting additional information that was faxed dated 09/11/2023-faxed completed form to [phone]. Trying to see if they will change it from denied after receiving the form and information.

## 2023-09-13 NOTE — Telephone Encounter (Signed)
Noted. PA still pending

## 2023-10-09 ENCOUNTER — Other Ambulatory Visit (HOSPITAL_COMMUNITY): Payer: Self-pay

## 2023-10-09 NOTE — Telephone Encounter (Signed)
      When ran for 30 DS or 90DS I get a refill too soon rejection, When ran for 84DS it can be filled but at a high copay.

## 2023-10-20 DIAGNOSIS — Z96611 Presence of right artificial shoulder joint: Secondary | ICD-10-CM | POA: Diagnosis not present

## 2023-10-20 DIAGNOSIS — M25511 Pain in right shoulder: Secondary | ICD-10-CM | POA: Diagnosis not present

## 2023-12-26 ENCOUNTER — Telehealth: Payer: Self-pay

## 2023-12-26 ENCOUNTER — Encounter (INDEPENDENT_AMBULATORY_CARE_PROVIDER_SITE_OTHER): Payer: Medicare Other | Admitting: Ophthalmology

## 2023-12-26 DIAGNOSIS — H43811 Vitreous degeneration, right eye: Secondary | ICD-10-CM | POA: Diagnosis not present

## 2023-12-26 DIAGNOSIS — H35372 Puckering of macula, left eye: Secondary | ICD-10-CM

## 2023-12-26 DIAGNOSIS — I10 Essential (primary) hypertension: Secondary | ICD-10-CM

## 2023-12-26 DIAGNOSIS — H35033 Hypertensive retinopathy, bilateral: Secondary | ICD-10-CM

## 2023-12-26 DIAGNOSIS — H338 Other retinal detachments: Secondary | ICD-10-CM | POA: Diagnosis not present

## 2023-12-26 NOTE — Telephone Encounter (Signed)
 VOB submitted for Monovisc, left knee

## 2023-12-31 NOTE — Progress Notes (Unsigned)
 Cardiology Office Note:    Date:  01/03/2024   ID:  Rick Turner, DOB April 15, 1951, MRN [id number]  PCP:  Emilio Aspen, MD  Cardiologist:  Little Ishikawa, MD  Electrophysiologist:  None   Referring MD: Emilio Aspen, *   Chief Complaint  Patient presents with   Follow-up    12 months.   Coronary Artery Disease    History of Present Illness:    Rick Turner is a 73 y.o. male with a hx of CAD, hypertension, hyperlipidemia, hypothyroidism, OSA who presents for follow-up.  He was referred by Dr. Valentina Lucks for evaluation of dyspnea on exertion, initially seen on 03/31/2020.  He has a history of elevated calcium score, was 645 in September 2015 in [place].  Underwent nuclear stress test in October 2015 which was normal.   Lexiscan Myoview was done on 04/07/2020, which showed normal perfusion, EF 58%.  Normal ABIs on 04/10/20.  Echocardiogram on 04/28/2020 showed biventricular function, grade 1 diastolic dysfunction, mild dilatation of the aortic root measuring 38 mm.  Echocardiogram 02/2023 showed normal biventricular function, no significant valvular disease, stable aorta measuring 37 mm.  Since last clinic visit, he reports he is doing well.  Reports has had some chest tightness but not related to exertion, states improves if he presses on his chest or belches.  He golfs regularly for exercise.  Has not noted any exertional chest pain or dyspnea.  Denies any lightheadedness, syncope, or palpitations.  Does report some ankle edema.  Takes as needed Lasix, about once per week.  Reports leg pain has improved.  He is compliant with CPAP.  He reports BP has been 110s to 120s at home.   Past Medical History:  Diagnosis Date   Acne rosacea    Arthritis    BPH (benign prostatic hypertrophy)    Clavicle fracture    left   Detached retina    left    Detached retina, left 2011   Fx ankle    left   GERD (gastroesophageal reflux disease)    Headache    Hyperlipidemia     Hypertension    Hypothyroidism    OSA (obstructive sleep apnea)    mild   Pre-diabetes    Seasonal allergies    Sleep apnea    Weakness of both lower extremities     Past Surgical History:  Procedure Laterality Date   CATARACT EXTRACTION  08/2011   left, right 2016   detached retiina  2011   left, 2014   EYE SURGERY     lasix   HERNIA REPAIR Right    ing   SEPTOPLASTY N/A 04/13/2023   Procedure: SEPTOPLASTY;  Surgeon: Scarlette Ar, MD;  Location: [facility] OR;  Service: ENT;  Laterality: N/A;   SINUS ENDO WITH FUSION Bilateral 04/13/2023   Procedure: FUNCTIONAL ENDOSCOPIC SINUS SURGERY; SPHENOIDOTOMY AND TISSUE REMOVAL WITH FUSION;  Surgeon: Scarlette Ar, MD;  Location: [facility] OR;  Service: ENT;  Laterality: Bilateral;   TONSILLECTOMY     TOTAL SHOULDER REPLACEMENT Left 2021   TOTAL SHOULDER REPLACEMENT Right 09/2022    Current Medications: Current Meds  Medication Sig   acetaminophen (TYLENOL) 500 MG tablet Take 2 tablets (1,000 mg total) by mouth every 6 (six) hours. (Patient taking differently: Take 1,000 mg by mouth every 8 (eight) hours as needed for moderate pain (pain score 4-6).)   ALPRAZolam (XANAX) 0.5 MG tablet Take 1 tablet (0.5 mg total) by mouth once as needed for up to 1 dose  for anxiety (take 1 tablet 30 min prior to procedure, can take a tablet before procedure. Must have a driver.).   amLODipine (NORVASC) 5 MG tablet Take 5 mg by mouth 2 (two) times daily.   ARTIFICIAL TEAR SOLUTION OP Place 1 drop into both eyes 2 (two) times daily.   aspirin EC 81 MG tablet Take 81 mg by mouth daily.   carvedilol (COREG) 12.5 MG tablet Take 1 tablet (12.5 mg total) by mouth 2 (two) times daily.   Cholecalciferol (VITAMIN D3) 50 MCG (2000 UT) capsule Take 2,000 Units by mouth daily.   Cyanocobalamin (VITAMIN B12) 1000 MCG TBCR Take 1,000 mcg by mouth daily.   diclofenac sodium (VOLTAREN) 1 % GEL APPLY TOPICALLY TO AFFECTED AREA ONCE DAILY AS NEEDED FOR PAIN   Erenumab-aooe  (AIMOVIG) 70 MG/ML SOAJ Inject 70 mg into the skin every 30 (thirty) days.   fexofenadine (ALLEGRA) 180 MG tablet Take 180 mg by mouth daily.    fluticasone (FLONASE) 50 MCG/ACT nasal spray Place 2 sprays into both nostrils 2 (two) times daily.   furosemide (LASIX) 20 MG tablet Take 1 tablet (20 mg total) by mouth daily as needed.   gabapentin (NEURONTIN) 300 MG capsule Take 1 capsule by mouth 2 (two) times daily.   ibuprofen (ADVIL) 200 MG tablet Take 400 mg by mouth every 6 (six) hours as needed for moderate pain.   levothyroxine (SYNTHROID) 125 MCG tablet Take 125 mcg by mouth every morning.   lidocaine 4 % Place 1-2 patches onto the skin daily as needed (pain).   Multiple Vitamin (MULTI VITAMIN) TABS Take 1 tablet by mouth daily.   omeprazole (PRILOSEC) 20 MG capsule Take 20 mg by mouth daily.   PRALUENT 75 MG/ML SOAJ Inject 75 mg/mL as directed every 14 (fourteen) days.   rizatriptan (MAXALT-MLT) 10 MG disintegrating tablet Take 1 tablet (10 mg total) by mouth as needed for migraine. May repeat in 2 hours if needed   tadalafil (CIALIS) 5 MG tablet Take 5 mg by mouth daily.   tamsulosin (FLOMAX) 0.4 MG CAPS capsule Take 0.4 mg by mouth daily.   topiramate (TOPAMAX) 50 MG tablet Take 1 tablet (50 mg total) by mouth 2 (two) times daily.   [DISCONTINUED] carvedilol (COREG) 25 MG tablet Take 25 mg by mouth 2 (two) times daily.   [DISCONTINUED] telmisartan (MICARDIS) 80 MG tablet Take 80 mg by mouth daily.     Allergies:   Chlorthalidone, Evolocumab, Hydrocod poli-chlorphe poli er, Hydrocodone-acetaminophen, and Statins   Social History   Socioeconomic History   Marital status: Married    Spouse name: Not on file   Number of children: 3   Years of education: PhD   Highest education level: Professional school degree (e.g., MD, DDS, DVM, JD)  Occupational History   Not on file  Tobacco Use   Smoking status: Never   Smokeless tobacco: Never  Vaping Use   Vaping status: Never Used   Substance and Sexual Activity   Alcohol use: Not Currently    Alcohol/week: 2.0 standard drinks of alcohol    Types: 2 Glasses of wine per week    Comment: 2- 3 daily   Drug use: Never   Sexual activity: Not on file  Other Topics Concern   Not on file  Social History Narrative   Lives with wife, retired    Caffeine 2-3 c day   Social Drivers of Corporate investment banker Strain: Not on file  Food Insecurity: Low Risk  (  05/02/2023)   Received from [facility]   Hunger Vital Sign    Worried About Running Out of Food in the Last Year: Never true    Ran Out of Food in the Last Year: Never true  Transportation Needs: Not on file (05/02/2023)  Physical Activity: Not on file  Stress: Not on file  Social Connections: Not on file     Family History: The patient's family history includes Breast cancer in his sister; CAD in his father and mother; Hypertension in his brother and brother; Lung cancer in his brother; Parkinson's disease in his brother.  ROS:   Please see the history of present illness.     All other systems reviewed and are negative.  EKGs/Labs/Other Studies Reviewed:    The following studies were reviewed today:   EKG:   01/03/2024: Sinus bradycardia, rate 51,no ST/T abnormality  Recent Labs: 04/11/2023: ALT 18; BUN 18; Creatinine, Ser 1.26; Hemoglobin 12.3; Platelets 257; Potassium 4.4; Sodium 137  Recent Lipid Panel    Component Value Date/Time   CHOL 243 (H) 11/24/2021 0846   TRIG 69 11/24/2021 0846   HDL 68 11/24/2021 0846   CHOLHDL 3.6 11/24/2021 0846   LDLCALC 163 (H) 11/24/2021 0846    Physical Exam:    VS:  BP 128/72 (BP Location: Right Arm, Patient Position: Sitting, Cuff Size: Normal)   Pulse (!) 51   Ht 5\' 10"  (1.778 m)   Wt 223 lb (101.2 kg)   BMI 32.00 kg/m     Wt Readings from Last 3 Encounters:  01/03/24 223 lb (101.2 kg)  05/23/23 239 lb (108.4 kg)  04/13/23 242 lb 12.8 oz (110.1 kg)     GEN:  in no acute distress HEENT:  Normal NECK: No JVD; No carotid bruits CARDIAC: RRR, no murmurs, rubs, gallops RESPIRATORY:  Clear to auscultation without rales, wheezing or rhonchi  ABDOMEN: Soft, non-tender, non-distended MUSCULOSKELETAL:  No edema; No deformity  SKIN: Warm and dry NEUROLOGIC:  Alert and oriented x 3 PSYCHIATRIC:  Normal affect   ASSESSMENT:    1. CAD in native artery   2. Essential hypertension   3. Hyperlipidemia, unspecified hyperlipidemia type   4. Family history of hypertrophic cardiomyopathy     PLAN:    Dyspnea on exertion: Given known CAD with calcium score 645 in 2015, dyspnea could represent anginal equivalent.  Lexiscan Myoview was done on 04/07/2020, which showed normal perfusion, EF 58%.  Echocardiogram on 04/28/2020 showed biventricular function, grade 1 diastolic dysfunction, mild dilatation of the aortic root measuring 38 mm.   -Reports symptoms have resolved, no further cardiac work-up recommended at this time.  CAD: Calcium score 645 in 2015.  Has been unable to tolerate statin, Repatha, or Zetia.  Referred to lipid clinic and started on Praluent on 04/10/2020.  LDL 74 on 01/26/2023.  Check lipid panel  Leg pain: Normal ABIs on 04/10/20  Hyperlipidemia: Has been unable to tolerate statins, Repatha, and Zetia.   LDL 115 on 11/02/2019, though that was on Zetia.  Goal LDL less than 70 given CAD history with calcium score 645 in 2015.  Referred to lipid clinic and started on Praluent on 04/10/2020.  Reported leg pain on Praluent but tolerating taking it every 3 weeks.  LDL 74 on 01/26/2023.  Check lipid panel  Hypertension: On amlodipine 5 mg twice daily, telmisartan 40 mg twice daily, carvedilol 25 mg twice daily.  -Low heart rate and he reports she has only been taking carvedilol 25 mg nightly.  Recommend decreasing carvedilol to 12.5 mg twice daily.  Asked to check BP/heart rate twice daily for next 2 weeks and let us know results  Family history of HCM: Son was diagnosed with apical  hypertrophic cardiomyopathy.  No evidence of HCM on echocardiogram 02/2023.  Discussed that his son's first degree relatives should be screened  OSA: reports compliance with CPAP  RTC in 1 year   Medication Adjustments/Labs and Tests Ordered: Current medicines are reviewed at length with the patient today.  Concerns regarding medicines are outlined above.  Orders Placed This Encounter  Procedures   Lipid panel   Basic metabolic panel   EKG 12-Lead   Meds ordered this encounter  Medications   carvedilol (COREG) 12.5 MG tablet    Sig: Take 1 tablet (12.5 mg total) by mouth 2 (two) times daily.    Dispense:  180 tablet    Refill:  3   telmisartan (MICARDIS) 40 MG tablet    Sig: Take 1 tablet (40 mg total) by mouth 2 (two) times daily.    Dispense:  180 tablet    Refill:  3    Patient Instructions  Medication Instructions:  Decrease carvedilol dose to 12.5 mg 2 times per day. Telmisartan 40 mg by mouth 2 times per day. *If you need a refill on your cardiac medications before your next appointment, please call your pharmacy*   Lab Work: Fasting BMET, Lipid profile today. If you have labs (blood work) drawn today and your tests are completely normal, you will receive your results only by: MyChart Message (if you have MyChart) OR A paper copy in the mail If you have any lab test that is abnormal or we need to change your treatment, we will call you to review the results.   Follow-Up: At [facility], you and your health needs are our priority.  As part of our continuing mission to provide you with exceptional heart care, we have created designated Provider Care Teams.  These Care Teams include your primary Cardiologist (physician) and Advanced Practice Providers (APPs -  Physician Assistants and Nurse Practitioners) who all work together to provide you with the care you need, when you need it.  We recommend signing up for the patient portal called "MyChart".  Sign up  information is provided on this After Visit Summary.  MyChart is used to connect with patients for Virtual Visits (Telemedicine).  Patients are able to view lab/test results, encounter notes, upcoming appointments, etc.  Non-urgent messages can be sent to your provider as well.   To learn more about what you can do with MyChart, go to [email or web address].    Your next appointment:   1 year(s)  Provider:   Little Ishikawa, MD     Other Instructions Take and record blood pressure 2 times per day for 2 weeks and sent the results in a my chart message for MD to review.          Signed, Little Ishikawa, MD  01/03/2024 11:30 AM    [facility]

## 2024-01-03 ENCOUNTER — Encounter: Payer: Self-pay | Admitting: Cardiology

## 2024-01-03 ENCOUNTER — Ambulatory Visit: Payer: Medicare Other | Attending: Cardiology | Admitting: Cardiology

## 2024-01-03 VITALS — BP 128/72 | HR 51 | Ht 70.0 in | Wt 223.0 lb

## 2024-01-03 DIAGNOSIS — Z8249 Family history of ischemic heart disease and other diseases of the circulatory system: Secondary | ICD-10-CM | POA: Diagnosis present

## 2024-01-03 DIAGNOSIS — I2510 Atherosclerotic heart disease of native coronary artery without angina pectoris: Secondary | ICD-10-CM

## 2024-01-03 DIAGNOSIS — I10 Essential (primary) hypertension: Secondary | ICD-10-CM

## 2024-01-03 DIAGNOSIS — E785 Hyperlipidemia, unspecified: Secondary | ICD-10-CM

## 2024-01-03 LAB — LIPID PANEL
Chol/HDL Ratio: 2.6 {ratio} (ref 0.0–5.0)
Cholesterol, Total: 157 mg/dL (ref 100–199)
HDL: 61 mg/dL (ref >39)
LDL Chol Calc (NIH): 83 mg/dL (ref 0–99)
Triglycerides: 66 mg/dL (ref 0–149)
VLDL Cholesterol Cal: 13 mg/dL (ref 5–40)

## 2024-01-03 LAB — BASIC METABOLIC PANEL
BUN/Creatinine Ratio: 13 (ref 10–24)
BUN: 16 mg/dL (ref 8–27)
CO2: 21 mmol/L (ref 20–29)
Calcium: 9.2 mg/dL (ref 8.6–10.2)
Chloride: 103 mmol/L (ref 96–106)
Creatinine, Ser: 1.25 mg/dL (ref 0.76–1.27)
Glucose: 124 mg/dL — ABNORMAL HIGH (ref 70–99)
Potassium: 4.5 mmol/L (ref 3.5–5.2)
Sodium: 139 mmol/L (ref 134–144)
eGFR: 61 mL/min/{1.73_m2} (ref >59)

## 2024-01-03 MED ORDER — CARVEDILOL 12.5 MG PO TABS: 12.5000 mg | ORAL_TABLET | Freq: Two times a day (BID) | ORAL | 3 refills | Status: AC

## 2024-01-03 MED ORDER — TELMISARTAN 40 MG PO TABS: 40.0000 mg | ORAL_TABLET | Freq: Two times a day (BID) | ORAL | 3 refills | Status: DC

## 2024-01-03 NOTE — Patient Instructions (Signed)
 Medication Instructions:  Decrease carvedilol dose to 12.5 mg 2 times per day. Telmisartan 40 mg by mouth 2 times per day. *If you need a refill on your cardiac medications before your next appointment, please call your pharmacy*   Lab Work: Fasting BMET, Lipid profile today. If you have labs (blood work) drawn today and your tests are completely normal, you will receive your results only by: MyChart Message (if you have MyChart) OR A paper copy in the mail If you have any lab test that is abnormal or we need to change your treatment, we will call you to review the results.   Follow-Up: At [facility], you and your health needs are our priority.  As part of our continuing mission to provide you with exceptional heart care, we have created designated Provider Care Teams.  These Care Teams include your primary Cardiologist (physician) and Advanced Practice Providers (APPs -  Physician Assistants and Nurse Practitioners) who all work together to provide you with the care you need, when you need it.  We recommend signing up for the patient portal called "MyChart".  Sign up information is provided on this After Visit Summary.  MyChart is used to connect with patients for Virtual Visits (Telemedicine).  Patients are able to view lab/test results, encounter notes, upcoming appointments, etc.  Non-urgent messages can be sent to your provider as well.   To learn more about what you can do with MyChart, go to [email or web address].    Your next appointment:   1 year(s)  Provider:   Little Ishikawa, MD     Other Instructions Take and record blood pressure 2 times per day for 2 weeks and sent the results in a my chart message for MD to review.

## 2024-01-05 ENCOUNTER — Telehealth: Payer: Self-pay | Admitting: Pharmacy Technician

## 2024-01-05 MED ORDER — TELMISARTAN 80 MG PO TABS: 40.0000 mg | ORAL_TABLET | Freq: Two times a day (BID) | ORAL | 3 refills | Status: AC

## 2024-01-05 NOTE — Addendum Note (Signed)
 Addended by: Marilynn Rail on: 01/05/2024 01:16 PM   Modules accepted: Orders

## 2024-01-05 NOTE — Telephone Encounter (Signed)
 Hello, insurance said they paid for the telmisartan 40mg  1 tablet twice a day today but they said for the next fill they the prescription would need to be changed to telmisartan 80mg  take 1/2 tablet twice a day. They said they wont pay for more than 30 tablets in 30 days. Thank you!

## 2024-01-05 NOTE — Telephone Encounter (Signed)
 Patient identification verified by 2 forms. Rick Rail, RN    Called and spoke to patient  Patient confirmed he picked up prescription  Informed patient:   -for next fill he will have to cut tablet in half, take 0.5 tablet BID instead   -this is due to insurance reasons  Patient verbalized understanding, no questions at this time

## 2024-01-11 ENCOUNTER — Other Ambulatory Visit: Payer: Self-pay

## 2024-01-11 ENCOUNTER — Encounter: Payer: Self-pay | Admitting: Orthopedic Surgery

## 2024-01-11 ENCOUNTER — Ambulatory Visit (INDEPENDENT_AMBULATORY_CARE_PROVIDER_SITE_OTHER): Payer: Medicare Other | Admitting: Orthopedic Surgery

## 2024-01-11 DIAGNOSIS — M1712 Unilateral primary osteoarthritis, left knee: Secondary | ICD-10-CM

## 2024-01-11 NOTE — Progress Notes (Signed)
   Procedure Note  Patient: Rick Turner             Date of Birth: [birth year]           MRN: [id number]             Visit Date: 01/11/2024  Procedures: Visit Diagnoses:  1. Arthritis of left knee     Large Joint Inj on 01/11/2024 2:09 PM Indications: pain, joint swelling and diagnostic evaluation Details: 18 G 1.5 in needle, superolateral approach  Arthrogram: No  Medications: 5 mL lidocaine 1 %; 88 mg Hyaluronan 88 MG/4ML Outcome: tolerated well, no immediate complications Procedure, treatment alternatives, risks and benefits explained, specific risks discussed. Consent was given by the patient. Immediately prior to procedure a time out was called to verify the correct patient, procedure, equipment, support staff and site/side marked as required. Patient was prepped and draped in the usual sterile fashion.    Lot #[id number] Last gel injection 7 months ago.  Patient doing well.  Has been somewhat less active during the winter.  Gel injection performed today. This patient is diagnosed with osteoarthritis of the knee(s).    Radiographs show evidence of joint space narrowing, osteophytes, subchondral sclerosis and/or subchondral cysts.  This patient has knee pain which interferes with functional and activities of daily living.    This patient has experienced inadequate response, adverse effects and/or intolerance with conservative treatments such as acetaminophen, NSAIDS, topical creams, physical therapy or regular exercise, knee bracing and/or weight loss.   This patient has experienced inadequate response or has a contraindication to intra articular steroid injections for at least 3 months.   This patient is not scheduled to have a total knee replacement within 6 months of starting treatment with viscosupplementation.

## 2024-01-12 ENCOUNTER — Encounter: Payer: Self-pay | Admitting: Orthopedic Surgery

## 2024-01-13 MED ORDER — LIDOCAINE HCL 1 % IJ SOLN
5.0000 mL | INTRAMUSCULAR | Status: AC | PRN
  Administered 2024-01-11: 5 mL

## 2024-01-13 MED ORDER — HYALURONAN 88 MG/4ML IX SOSY
88.0000 mg | PREFILLED_SYRINGE | INTRA_ARTICULAR | Status: AC | PRN
  Administered 2024-01-11: 88 mg via INTRA_ARTICULAR

## 2024-01-22 ENCOUNTER — Encounter: Payer: Self-pay | Admitting: Cardiology

## 2024-05-07 ENCOUNTER — Other Ambulatory Visit: Payer: Self-pay

## 2024-05-07 DIAGNOSIS — R0602 Shortness of breath: Secondary | ICD-10-CM

## 2024-05-07 DIAGNOSIS — E782 Mixed hyperlipidemia: Secondary | ICD-10-CM

## 2024-05-07 DIAGNOSIS — I2584 Coronary atherosclerosis due to calcified coronary lesion: Secondary | ICD-10-CM

## 2024-05-16 ENCOUNTER — Encounter (HOSPITAL_COMMUNITY): Payer: Self-pay

## 2024-05-20 ENCOUNTER — Ambulatory Visit (HOSPITAL_COMMUNITY)
Admission: RE | Admit: 2024-05-20 | Discharge: 2024-05-20 | Disposition: A | Discharge status: Home / Self Care | Source: Ambulatory Visit

## 2024-05-20 DIAGNOSIS — R0602 Shortness of breath: Secondary | ICD-10-CM | POA: Insufficient documentation

## 2024-05-20 DIAGNOSIS — E782 Mixed hyperlipidemia: Secondary | ICD-10-CM | POA: Diagnosis not present

## 2024-05-20 DIAGNOSIS — I2510 Atherosclerotic heart disease of native coronary artery without angina pectoris: Secondary | ICD-10-CM | POA: Diagnosis not present

## 2024-05-20 DIAGNOSIS — I2584 Coronary atherosclerosis due to calcified coronary lesion: Secondary | ICD-10-CM | POA: Insufficient documentation

## 2024-05-20 MED ORDER — IOHEXOL 350 MG/ML SOLN
100.0000 mL | Freq: Once | INTRAVENOUS | Status: AC | PRN
  Administered 2024-05-20: 100 mL via INTRAVENOUS

## 2024-05-20 MED ORDER — NITROGLYCERIN 0.4 MG SL SUBL
0.8000 mg | SUBLINGUAL_TABLET | Freq: Once | SUBLINGUAL | Status: AC
  Administered 2024-05-20: 0.8 mg via SUBLINGUAL

## 2024-05-21 ENCOUNTER — Ambulatory Visit (HOSPITAL_COMMUNITY)
Admission: RE | Admit: 2024-05-21 | Discharge: 2024-05-21 | Disposition: A | Discharge status: Home / Self Care | Source: Ambulatory Visit | Attending: Cardiology | Admitting: Cardiology

## 2024-05-21 ENCOUNTER — Other Ambulatory Visit: Payer: Self-pay | Admitting: Cardiology

## 2024-05-21 DIAGNOSIS — R931 Abnormal findings on diagnostic imaging of heart and coronary circulation: Secondary | ICD-10-CM | POA: Diagnosis not present

## 2024-05-21 DIAGNOSIS — I2510 Atherosclerotic heart disease of native coronary artery without angina pectoris: Secondary | ICD-10-CM | POA: Diagnosis not present

## 2024-07-05 ENCOUNTER — Other Ambulatory Visit: Payer: Self-pay

## 2024-07-05 DIAGNOSIS — M1712 Unilateral primary osteoarthritis, left knee: Secondary | ICD-10-CM

## 2024-07-25 ENCOUNTER — Encounter: Payer: Self-pay | Admitting: Orthopedic Surgery

## 2024-07-25 ENCOUNTER — Ambulatory Visit: Admitting: Orthopedic Surgery

## 2024-07-25 DIAGNOSIS — M1712 Unilateral primary osteoarthritis, left knee: Secondary | ICD-10-CM | POA: Diagnosis not present

## 2024-07-25 MED ORDER — HYALURONAN 88 MG/4ML IX SOSY
88.0000 mg | PREFILLED_SYRINGE | INTRA_ARTICULAR | Status: AC | PRN
  Administered 2024-07-25: 88 mg via INTRA_ARTICULAR

## 2024-07-25 MED ORDER — LIDOCAINE HCL 1 % IJ SOLN
5.0000 mL | INTRAMUSCULAR | Status: AC | PRN
  Administered 2024-07-25: 5 mL

## 2024-07-25 NOTE — Progress Notes (Signed)
   Procedure Note  Patient: Rick Turner             Date of Birth: Jan 31, 1951           MRN: [id number]             Visit Date: 07/25/2024  Procedures: Visit Diagnoses:  1. Arthritis of left knee     Large Joint Inj: L knee on 07/25/2024 5:22 PM Indications: pain, joint swelling and diagnostic evaluation Details: 18 G 1.5 in needle, superolateral approach  Arthrogram: No  Medications: 5 mL lidocaine  1 %; 88 mg Hyaluronan 88 MG/4ML Outcome: tolerated well, no immediate complications Procedure, treatment alternatives, risks and benefits explained, specific risks discussed. Consent was given by the patient. Immediately prior to procedure a time out was called to verify the correct patient, procedure, equipment, support staff and site/side marked as required. Patient was prepped and draped in the usual sterile fashion.     Lot #[id number]

## 2024-08-05 ENCOUNTER — Ambulatory Visit (INDEPENDENT_AMBULATORY_CARE_PROVIDER_SITE_OTHER)

## 2024-08-05 ENCOUNTER — Ambulatory Visit (INDEPENDENT_AMBULATORY_CARE_PROVIDER_SITE_OTHER): Payer: Self-pay | Admitting: Podiatry

## 2024-08-05 VITALS — Ht 70.0 in | Wt 223.0 lb

## 2024-08-05 DIAGNOSIS — M7752 Other enthesopathy of left foot: Secondary | ICD-10-CM | POA: Diagnosis not present

## 2024-08-05 DIAGNOSIS — S92512A Displaced fracture of proximal phalanx of left lesser toe(s), initial encounter for closed fracture: Secondary | ICD-10-CM | POA: Diagnosis not present

## 2024-08-05 NOTE — Patient Instructions (Addendum)
  VISIT SUMMARY: You came in today because of foot pain that has been bothering you for a few months, especially when playing golf. Despite trying new shoes and a metatarsal pad, the pain has persisted and even worsened recently. You have a history of arthritis in your fingers and an old injury to the same area of your foot.  YOUR PLAN: -IMPULSION FRACTURE OF LEFT FOURTH TOE: An impulsion fracture is a type of bone break that can be caused by pressure or impact. You have a fracture at the base of your left fourth toe, which may be old but has recently been aggravated. To help with this, you should wear a post-op surgical shoe or a stiff sole shoe to limit motion, use shoes with a wide toe box and forefoot for comfort, and avoid putting pressure on the affected area. If your symptoms do not improve by early winter, surgery might be considered to remove the bone fragment, although this is uncommon.  -LEFT FOOT PAIN: Your left foot pain is likely related to the impulsion fracture and possibly arthritis. The pain worsens with certain movements and pressure. You should monitor which shoes make the pain worse and consider using custom insoles for better support. Splinting your toes together during activities might also provide relief.  -METATARSALGIA, BILATERAL: Metatarsalgia is pain in the ball of the foot. You have this condition in both feet, and it gets worse with walking and certain shoes. This pain is likely due to muscle fatigue and inadequate arch support. Using custom insoles for better arch support, especially in your athletic shoes, is recommended. Monitor your symptoms and adjust your footwear as needed.  -MILD PES PLANUS: Pes planus, or flat feet, means that the arches of your feet are slightly flattened, which can cause muscle fatigue and pain during activities like golf. You may benefit from custom orthotics for better arch support, particularly in your golf shoes.  INSTRUCTIONS: Please monitor  your symptoms and report back if they do not improve by early winter. Surgery may be considered if the pain persists. Additionally, consider using custom insoles and orthotics for better support and comfort.                      Contains text generated by Abridge.                                 Contains text generated by Abridge.

## 2024-08-06 NOTE — Progress Notes (Signed)
 Subjective:  Patient ID: Rick Turner, male    DOB: 01-Feb-1951,  MRN: [id number]  Chief Complaint  Patient presents with   Foot Pain    Rm 9 Evaluation of left foot pain and recommended treatment. Pain located in ball and dorsal aspect of the left foot radiating towards the 4th and 5th toes.    Discussed the use of AI scribe software for clinical note transcription with the patient, who gave verbal consent to proceed.  History of Present Illness Rick Turner is a 73 year old male who presents with foot pain.  He began experiencing discomfort in the bottoms of both feet a few months ago, initially attributing it to fatigue and soreness from walking while playing golf. Despite purchasing new golf shoes with a larger toe box, the discomfort has persisted and worsened over the last ten days, particularly in the pad of the foot.  He attempted to use a metatarsal pad, which resulted in intense pain on the top of his foot after walking for about an hour, leading him to discontinue its use. The pain has been persistent, even at night, with discomfort when the blanket rubs over the top of his foot. He describes the pain as tender and localized, with no significant pain in other areas of the foot.  He denies any recent injuries to the foot, although he recalls a childhood incident involving a pitchfork injury in the same area. He also notes a history of arthritis in his fingers, which he compares to the aching sensation in his foot when clenching his toes.  He experiences tiredness and achiness in the arch of his foot, particularly after playing golf. He typically uses a cart while golfing but acknowledges that there is still considerable walking involved.      Objective:    Physical Exam VASCULAR: DP and PT pulse palpable. Foot is warm and well-perfused. Capillary fill time is brisk. DERMATOLOGIC: Normal skin turgor, texture, and temperature. No open lesions, rashes, or  ulcerations. NEUROLOGIC: Normal sensation to light touch and pressure. No paresthesias on examination. ORTHOPEDIC: Pain at the base of the left fourth toe, worse with plantar flexion and adduction. Mild edema in the left foot. No ecchymosis or bruising. No gross deformity. No pain in the fourth inner space of the left foot. Smooth pain-free range of motion of all examined joints. No pain to palpation.   No images are attached to the encounter.    Results RADIOLOGY Left foot X-ray: Avulsion fracture of the lateral base of the fourth toe   Assessment:   1. Closed displaced fracture of proximal phalanx of lesser toe of left foot, initial encounter      Plan:  Patient was evaluated and treated and all questions answered.  Assessment and Plan Assessment & Plan Impulsion fracture of left fourth toe Impulsion fracture at the lateral base of the left fourth toe with pain exacerbated by plantar flexion and adduction. No evidence of stress fracture or neuroma. The fracture may be old with recent aggravation, and the fragment may not have healed correctly, causing current symptoms. - Advise wearing a post-op surgical shoe or stiff sole shoe to limit motion. - Recommend using a wide toe box and wide forefoot shoe for long-term comfort. - Suggest avoiding pressure on the affected area. - Instruct to monitor symptoms and report if not improved by early winter, as surgery may be considered. - Consider surgery to remove the bone fragment if symptoms persist, though this is uncommon.  Left foot pain Pain at the base of the left fourth toe, exacerbated by certain movements and pressure. No signs of stress fracture or neuroma. Pain may be related to the impulsion fracture and possible arthritis. Relief is experienced when toes are splinted together, indicating a possible benefit from immobilization. - Advise monitoring shoe types to identify which exacerbate symptoms. - Suggest using custom insoles  molded to the foot for better support if needed. - Consider splinting toes together for relief during activities.  Metatarsalgia, bilateral Bilateral metatarsalgia with pain in the pads of both feet, exacerbated by walking and certain footwear. Pain is not consistent with a stress fracture or neuroma. Symptoms may be due to muscle fatigue and inadequate arch support, with tiredness and achiness in the arches, particularly after playing golf. - Recommend using custom insoles for better arch support, especially in athletic shoes. - Advise monitoring symptoms and adjusting footwear accordingly.  Mild pes planus Mild flattening of the arches, contributing to muscle fatigue and foot pain during activities like golf. Some arch support is present in current golf shoes but may benefit from additional support. - Consider custom orthotics for improved arch support, particularly in golf shoes.      Return if symptoms worsen or fail to improve.

## 2024-09-09 ENCOUNTER — Encounter: Payer: Self-pay | Admitting: Radiology

## 2024-09-23 ENCOUNTER — Other Ambulatory Visit: Payer: Self-pay | Admitting: Diagnostic Neuroimaging

## 2024-09-24 NOTE — Telephone Encounter (Signed)
 Please contact patient to schedule OV. Thanks!

## 2024-09-26 NOTE — Telephone Encounter (Signed)
 Pt called Pt appt Scheduled

## 2024-12-25 ENCOUNTER — Encounter (INDEPENDENT_AMBULATORY_CARE_PROVIDER_SITE_OTHER): Payer: Medicare Other | Admitting: Ophthalmology

## 2024-12-31 ENCOUNTER — Encounter (INDEPENDENT_AMBULATORY_CARE_PROVIDER_SITE_OTHER): Admitting: Ophthalmology

## 2025-02-25 ENCOUNTER — Ambulatory Visit: Admitting: Cardiology

## 2025-05-19 ENCOUNTER — Ambulatory Visit: Admitting: Diagnostic Neuroimaging
